# Patient Record
Sex: Male | Born: 1950 | Race: White | Hispanic: No | Smoking: Former smoker
Health system: Southern US, Community
[De-identification: ages and names within clinical notes are randomized; demographics above are authoritative.]

## PROBLEM LIST (undated history)

## (undated) DIAGNOSIS — R972 Elevated prostate specific antigen [PSA]: Secondary | ICD-10-CM

## (undated) DIAGNOSIS — Z86718 Personal history of other venous thrombosis and embolism: Secondary | ICD-10-CM

## (undated) DIAGNOSIS — T7840XA Allergy, unspecified, initial encounter: Secondary | ICD-10-CM

## (undated) DIAGNOSIS — K5790 Diverticulosis of intestine, part unspecified, without perforation or abscess without bleeding: Secondary | ICD-10-CM

## (undated) DIAGNOSIS — M1990 Unspecified osteoarthritis, unspecified site: Secondary | ICD-10-CM

## (undated) DIAGNOSIS — Z86010 Personal history of colonic polyps: Secondary | ICD-10-CM

## (undated) DIAGNOSIS — C801 Malignant (primary) neoplasm, unspecified: Secondary | ICD-10-CM

## (undated) DIAGNOSIS — E291 Testicular hypofunction: Secondary | ICD-10-CM

## (undated) DIAGNOSIS — K219 Gastro-esophageal reflux disease without esophagitis: Secondary | ICD-10-CM

## (undated) DIAGNOSIS — K649 Unspecified hemorrhoids: Secondary | ICD-10-CM

## (undated) DIAGNOSIS — I10 Essential (primary) hypertension: Secondary | ICD-10-CM

## (undated) DIAGNOSIS — Z860101 Personal history of adenomatous and serrated colon polyps: Secondary | ICD-10-CM

## (undated) DIAGNOSIS — E785 Hyperlipidemia, unspecified: Secondary | ICD-10-CM

## (undated) HISTORY — DX: Unspecified hemorrhoids: K64.9

## (undated) HISTORY — DX: Malignant (primary) neoplasm, unspecified: C80.1

## (undated) HISTORY — DX: Testicular hypofunction: E29.1

## (undated) HISTORY — DX: Hyperlipidemia, unspecified: E78.5

## (undated) HISTORY — DX: Personal history of other venous thrombosis and embolism: Z86.718

## (undated) HISTORY — DX: Personal history of colonic polyps: Z86.010

## (undated) HISTORY — PX: HEMORRHOID BANDING: SHX5850

## (undated) HISTORY — DX: Gastro-esophageal reflux disease without esophagitis: K21.9

## (undated) HISTORY — DX: Personal history of adenomatous and serrated colon polyps: Z86.0101

## (undated) HISTORY — PX: COLONOSCOPY W/ BIOPSIES: SHX1374

## (undated) HISTORY — PX: ESOPHAGOGASTRODUODENOSCOPY: SHX1529

## (undated) HISTORY — DX: Unspecified osteoarthritis, unspecified site: M19.90

## (undated) HISTORY — DX: Elevated prostate specific antigen (PSA): R97.20

## (undated) HISTORY — DX: Allergy, unspecified, initial encounter: T78.40XA

## (undated) HISTORY — DX: Essential (primary) hypertension: I10

## (undated) HISTORY — DX: Diverticulosis of intestine, part unspecified, without perforation or abscess without bleeding: K57.90

---

## 1999-03-27 ENCOUNTER — Encounter: Payer: Self-pay | Admitting: Family Medicine

## 1999-03-27 ENCOUNTER — Ambulatory Visit (HOSPITAL_COMMUNITY): Admission: RE | Admit: 1999-03-27 | Discharge: 1999-03-27 | Payer: Self-pay

## 2003-11-14 ENCOUNTER — Encounter: Payer: Self-pay | Admitting: Internal Medicine

## 2003-12-19 ENCOUNTER — Encounter: Payer: Self-pay | Admitting: Internal Medicine

## 2003-12-19 LAB — HM COLONOSCOPY

## 2004-04-06 ENCOUNTER — Ambulatory Visit (HOSPITAL_COMMUNITY): Admission: RE | Admit: 2004-04-06 | Discharge: 2004-04-06 | Payer: Self-pay | Admitting: Family Medicine

## 2008-05-15 ENCOUNTER — Ambulatory Visit: Payer: Self-pay | Admitting: Vascular Surgery

## 2009-12-19 ENCOUNTER — Ambulatory Visit: Payer: Self-pay | Admitting: Vascular Surgery

## 2010-02-05 ENCOUNTER — Ambulatory Visit (HOSPITAL_COMMUNITY): Admission: RE | Admit: 2010-02-05 | Discharge: 2010-02-05 | Payer: Self-pay | Admitting: Vascular Surgery

## 2010-02-05 ENCOUNTER — Ambulatory Visit: Payer: Self-pay | Admitting: Vascular Surgery

## 2010-02-16 ENCOUNTER — Ambulatory Visit: Payer: Self-pay | Admitting: Vascular Surgery

## 2010-02-27 ENCOUNTER — Ambulatory Visit: Payer: Self-pay | Admitting: Vascular Surgery

## 2010-03-06 DEATH — deceased

## 2010-08-17 ENCOUNTER — Ambulatory Visit: Payer: Self-pay | Admitting: Family Medicine

## 2010-08-17 DIAGNOSIS — R7989 Other specified abnormal findings of blood chemistry: Secondary | ICD-10-CM | POA: Insufficient documentation

## 2010-08-17 DIAGNOSIS — I10 Essential (primary) hypertension: Secondary | ICD-10-CM | POA: Insufficient documentation

## 2010-08-17 DIAGNOSIS — K219 Gastro-esophageal reflux disease without esophagitis: Secondary | ICD-10-CM | POA: Insufficient documentation

## 2010-08-17 DIAGNOSIS — E785 Hyperlipidemia, unspecified: Secondary | ICD-10-CM | POA: Insufficient documentation

## 2010-08-17 DIAGNOSIS — J309 Allergic rhinitis, unspecified: Secondary | ICD-10-CM | POA: Insufficient documentation

## 2010-08-18 ENCOUNTER — Encounter: Payer: Self-pay | Admitting: Internal Medicine

## 2010-09-18 ENCOUNTER — Ambulatory Visit: Payer: Self-pay | Admitting: Family Medicine

## 2010-09-18 DIAGNOSIS — R972 Elevated prostate specific antigen [PSA]: Secondary | ICD-10-CM | POA: Insufficient documentation

## 2010-10-01 ENCOUNTER — Ambulatory Visit: Payer: Self-pay | Admitting: Internal Medicine

## 2010-10-26 ENCOUNTER — Telehealth: Payer: Self-pay | Admitting: Family Medicine

## 2010-11-11 ENCOUNTER — Telehealth: Payer: Self-pay | Admitting: Family Medicine

## 2010-12-20 ENCOUNTER — Telehealth: Payer: Self-pay | Admitting: Internal Medicine

## 2010-12-20 ENCOUNTER — Encounter: Payer: Self-pay | Admitting: Internal Medicine

## 2010-12-23 ENCOUNTER — Other Ambulatory Visit: Payer: Self-pay | Admitting: Family Medicine

## 2010-12-23 ENCOUNTER — Ambulatory Visit
Admission: RE | Admit: 2010-12-23 | Discharge: 2010-12-23 | Payer: Self-pay | Source: Home / Self Care | Attending: Family Medicine | Admitting: Family Medicine

## 2010-12-23 LAB — CBC WITH DIFFERENTIAL/PLATELET
Basophils Absolute: 0 10*3/uL (ref 0.0–0.1)
Basophils Relative: 0.3 % (ref 0.0–3.0)
Eosinophils Absolute: 0.2 10*3/uL (ref 0.0–0.7)
Eosinophils Relative: 2.5 % (ref 0.0–5.0)
HCT: 41 % (ref 39.0–52.0)
Hemoglobin: 14 g/dL (ref 13.0–17.0)
Lymphocytes Relative: 13.5 % (ref 12.0–46.0)
Lymphs Abs: 0.9 10*3/uL (ref 0.7–4.0)
MCHC: 34.2 g/dL (ref 30.0–36.0)
MCV: 93.4 fl (ref 78.0–100.0)
Monocytes Absolute: 0.6 10*3/uL (ref 0.1–1.0)
Monocytes Relative: 9 % (ref 3.0–12.0)
Neutro Abs: 5.1 10*3/uL (ref 1.4–7.7)
Neutrophils Relative %: 74.7 % (ref 43.0–77.0)
Platelets: 175 10*3/uL (ref 150.0–400.0)
RBC: 4.39 Mil/uL (ref 4.22–5.81)
RDW: 13 % (ref 11.5–14.6)
WBC: 6.8 10*3/uL (ref 4.5–10.5)

## 2010-12-23 LAB — CONVERTED CEMR LAB
Blood in Urine, dipstick: NEGATIVE
Ketones, urine, test strip: NEGATIVE
Specific Gravity, Urine: 1.025
Urobilinogen, UA: 0.2
WBC Urine, dipstick: NEGATIVE
pH: 5

## 2010-12-23 LAB — BASIC METABOLIC PANEL
BUN: 20 mg/dL (ref 6–23)
CO2: 30 mEq/L (ref 19–32)
Calcium: 8.8 mg/dL (ref 8.4–10.5)
Chloride: 103 mEq/L (ref 96–112)
Creatinine, Ser: 1 mg/dL (ref 0.4–1.5)
GFR: 78.07 mL/min (ref >60.00)
Glucose, Bld: 105 mg/dL — ABNORMAL HIGH (ref 70–99)
Potassium: 4.1 mEq/L (ref 3.5–5.1)
Sodium: 139 mEq/L (ref 135–145)

## 2010-12-23 LAB — LIPID PANEL
Cholesterol: 191 mg/dL (ref 0–200)
HDL: 45 mg/dL (ref >39.00)
LDL Cholesterol: 107 mg/dL — ABNORMAL HIGH (ref 0–99)
Total CHOL/HDL Ratio: 4
Triglycerides: 195 mg/dL — ABNORMAL HIGH (ref 0.0–149.0)
VLDL: 39 mg/dL (ref 0.0–40.0)

## 2010-12-23 LAB — TSH: TSH: 2.56 u[IU]/mL (ref 0.35–5.50)

## 2010-12-23 LAB — HEPATIC FUNCTION PANEL
ALT: 21 U/L (ref 0–53)
AST: 29 U/L (ref 0–37)
Albumin: 4.1 g/dL (ref 3.5–5.2)
Alkaline Phosphatase: 53 U/L (ref 39–117)
Bilirubin, Direct: 0.1 mg/dL (ref 0.0–0.3)
Total Bilirubin: 0.8 mg/dL (ref 0.3–1.2)
Total Protein: 6.7 g/dL (ref 6.0–8.3)

## 2010-12-23 LAB — PSA: PSA: 2.91 ng/mL (ref 0.10–4.00)

## 2010-12-23 LAB — TESTOSTERONE: Testosterone: 140.31 ng/dL — ABNORMAL LOW (ref 350.00–890.00)

## 2010-12-27 ENCOUNTER — Encounter: Payer: Self-pay | Admitting: Family Medicine

## 2010-12-30 ENCOUNTER — Ambulatory Visit
Admission: RE | Admit: 2010-12-30 | Discharge: 2010-12-30 | Payer: Self-pay | Source: Home / Self Care | Attending: Family Medicine | Admitting: Family Medicine

## 2011-01-07 NOTE — Letter (Signed)
Summary: Letter from Patient  Letter from Patient   Imported By: Lennie Odor 12/24/2010 11:30:56  _____________________________________________________________________  External Attachment:    Type:   Image     Comment:   External Document

## 2011-01-07 NOTE — Progress Notes (Signed)
Summary: omperazole change and rev needed in 2 months  Phone Note Outgoing Call   Summary of Call: Letter received indicating he was still having reflux signs on omeprazole 40 mg daily and was better on 40 mg AM and 20 mg late afternoon. Still with some sxs. I have changed rx to omeprazole 40 mg two times a day and want him to see me after 2 months of that Rx Iva Boop MD, [facility]  December 20, 2010 11:24 AM   Follow-up for Phone Call        I have left a message for the patient with the above information.  I have asked him to call me back if he has any questions.  He is also asked to call back next month to schedule an appointment for mid March. Follow-up by: Darcey Nora RN, CGRN,  December 21, 2010 11:27 AM    New/Updated Medications: OMEPRAZOLE 40 MG CPDR (OMEPRAZOLE) 1 by mouth twice a day  30-60 mins before breakfast and supper Prescriptions: OMEPRAZOLE 40 MG CPDR (OMEPRAZOLE) 1 by mouth twice a day  30-60 mins before breakfast and supper  #60 x 3   Entered and Authorized by:   Iva Boop MD, [facility]   Signed by:   Iva Boop MD, FACG on 12/20/2010   Method used:   Electronically to        [facility] [id number]* (retail)       [address]       [place], [place]  [address]       Ph: [phone]       Fax: [phone]   RxID:   [id number]

## 2011-01-07 NOTE — Progress Notes (Signed)
Summary: REQUEST FOR LABS  Phone Note Call from Patient   Caller: Patient Summary of Call: Pt has cpx /  labwork scheduled for January 2012.... would like to have Testosterone Lvl checked as well as PSA test added to cpx labs.... Can you advise order for same?  Initial call taken by: Debbra Riding,  November 11, 2010 9:43 AM  Follow-up for Phone Call        OK to add total testosterone and PSA to labs.  Need to make sure he has labs drawn as early in AM as possible. Follow-up by: Evelena Peat MD,  November 11, 2010 1:12 PM  Additional Follow-up for Phone Call Additional follow up Details #1::        Done....  total testosterone and PSA added to cpx labs.  Additional Follow-up by: Debbra Riding,  November 11, 2010 1:54 PM

## 2011-01-07 NOTE — Assessment & Plan Note (Signed)
Summary: fu per pt/ on bp/meds/njr   Vital Signs:  Patient profile:   60 year old male Weight:      178 pounds Temp:     98.0 degrees F oral BP sitting:   140 / 88  (left arm) BP standing:   110 / 88  (left arm) Cuff size:   regular  Vitals Entered By: Sid Falcon LPN (September 18, 2010 8:49 AM)  Serial Vital Signs/Assessments:  Time      Position  BP       Pulse  Resp  Temp     By           Sitting   130/80                         Evelena Peat MD           Standing  130/80                         Evelena Peat MD   History of Present Illness: Patient seen for multiple items as follows.  History hypertension. Recent orthostatic changes. We reduced lisinopril to one half tablet daily and he felt better but ended up stopping this altogether. Blood pressures have been stable by home readings. No current orthostatic changes. Taking amlodipine.  Needs refills pravastatin for hyperlipidemia. No side effect from medication.  Some persistent GERD symptoms on Nexium. He has GI referral pending.  Frequent leg cramps at night. These may be related to inadequate fluid intake. These are usually fairly brief and mild. No diuretic use.  Reported past history of elevated PSA. Negative biopsies. Needs followup PSA in 6 months and requests getting here.  Allergies (verified): No Known Drug Allergies  Past History:  Past Medical History: Last updated: 08/17/2010 Asthma GERD Hyperlipidemia Hypertension low testosterone Hay fever, allergies  Past Surgical History: Last updated: 08/17/2010 Nasal cartilidge surgery 1975 Verisose veins, 2011  Family History: Last updated: 08/17/2010 Family History of Alcoholism/Addiction, father  Social History: Last updated: 08/17/2010 Occupation:  [employer] Married Alcohol use-yes Regular exercise-yes quit smoking 1989  Risk Factors: Exercise: yes (08/17/2010)  Risk Factors: Smoking Status: quit < 6 months  (08/17/2010) Packs/Day: 0.5 (08/17/2010) PMH-FH-SH reviewed for relevance  Review of Systems  The patient denies anorexia, fever, weight loss, hoarseness, chest pain, syncope, dyspnea on exertion, peripheral edema, prolonged cough, headaches, hemoptysis, abdominal pain, melena, hematochezia, hematuria, and incontinence.    Physical Exam  General:  Well-developed,well-nourished,in no acute distress; alert,appropriate and cooperative throughout examination Mouth:  Oral mucosa and oropharynx without lesions or exudates.  Teeth in good repair. Neck:  No deformities, masses, or tenderness noted. Lungs:  Normal respiratory effort, chest expands symmetrically. Lungs are clear to auscultation, no crackles or wheezes. Heart:  Normal rate and regular rhythm. S1 and S2 normal without gallop, murmur, click, rub or other extra sounds. Extremities:  no edema Neurologic:  alert & oriented X3 and cranial nerves II-XII intact.   Skin:  no rashes.   Psych:  good eye contact, not anxious appearing, and not depressed appearing.     Impression & Recommendations:  Problem # 1:  HYPERTENSION (ICD-401.9) orthostatic changes resolved and blood pressure stable on single medication with amlodipine His updated medication list for this problem includes:    Amlodipine Besylate 5 Mg Tabs (Amlodipine besylate) ..... Once daily    Lisinopril 20 Mg Tabs (Lisinopril) ..... One half tablet daily.  Problem #  2:  GERD (ICD-530.81) continued symptoms on PPI.  GI referral pending. His updated medication list for this problem includes:    Omeprazole 20 Mg Cpdr (Omeprazole) ..... Once daily  Problem # 3:  HYPERLIPIDEMIA (ICD-272.4)  His updated medication list for this problem includes:    Pravastatin Sodium 40 Mg Tabs (Pravastatin sodium) ..... Once daily  Problem # 4:  ELEVATED PROSTATE SPECIFIC ANTIGEN (ICD-790.93) repeat PSA in 6 months.  Problem # 5:  LEG CRAMPS (ICD-729.82) increase fluid intake.  Complete  Medication List: 1)  Omeprazole 20 Mg Cpdr (Omeprazole) .... Once daily 2)  Amlodipine Besylate 5 Mg Tabs (Amlodipine besylate) .... Once daily 3)  Lisinopril 20 Mg Tabs (Lisinopril) .... One half tablet daily. 4)  Androgel Pump 20.25 Mg/act (1.62%) Gel (Testosterone) .... 2 pumps daily  (on hold) 5)  Pravastatin Sodium 40 Mg Tabs (Pravastatin sodium) .... Once daily 6)  Fluticasone Propionate 50 Mcg/act Susp (Fluticasone propionate) .... 2 sprays to each nostril daily as needed  Other Orders: Admin 1st Vaccine (16109) Flu Vaccine 27yrs + (60454)  Patient Instructions: 1)  Schedule PSA in 6 months  790.93 2)  Keep follow up with gastroenterologist 3)  Please schedule a follow-up appointment in 6 months .  4)  Hold lisinopril for now. Prescriptions: AMLODIPINE BESYLATE 5 MG TABS (AMLODIPINE BESYLATE) once daily  #90 x 3   Entered and Authorized by:   Evelena Peat MD   Signed by:   Evelena Peat MD on 09/18/2010   Method used:   Electronically to        [facility] [id number]* (retail)       [address]       [place], [place]  [address]       Ph: [phone]       Fax: [phone]   RxID:   [id number] PRAVASTATIN SODIUM 40 MG TABS (PRAVASTATIN SODIUM) once daily  #90 x 3   Entered and Authorized by:   Evelena Peat MD   Signed by:   Evelena Peat MD on 09/18/2010   Method used:   Electronically to        [facility] [id number]* (retail)       [address]       [place], [place]  [address]       Ph: [phone]       Fax: [phone]   RxID:   [id number]    Flu Vaccine Consent Questions     Do you have a history of severe allergic reactions to this vaccine? no    Any prior history of allergic reactions to egg and/or gelatin? no    Do you have a sensitivity to the preservative Thimersol? no    Do you have a past history of Guillan-Barre Syndrome? no    Do you currently have an acute febrile illness? no    Have you ever had a  severe reaction to latex? no    Vaccine information given and explained to patient? yes    Are you currently pregnant? no    Lot Number:[id number]   Exp Date:06/05/2011   Site Given  Left Deltoid IMbflu

## 2011-01-07 NOTE — Consult Note (Signed)
Summary: [facility]   Imported By: Sherian Rein 10/02/2010 09:09:53  _____________________________________________________________________  External Attachment:    Type:   Image     Comment:   External Document

## 2011-01-07 NOTE — Procedures (Signed)
Summary: EGD: Normal   EGD  Procedure date:  12/19/2003  Findings:      Assessment Normal examination.   Patient Name: Daniel Sullivan, Daniel Sullivan. MRN:  Procedure Procedures: Panendoscopy (EGD) CPT: 43235.  Personnel: Endoscopist: Iva Boop, MD, [facility]  Referred By: Arsenio Loader, MD.  Exam Location: Exam performed in Outpatient Clinic. Outpatient  Patient Consent: Procedure, Alternatives, Risks and Benefits discussed, consent obtained, from patient. Consent was obtained by the RN.  Indications Symptoms: Chest Pain. Abdominal pain, location: epigastric. Reflux symptoms  History  Current Medications: Patient is not currently taking Coumadin.  Pre-Exam Physical: Performed Dec 19, 2003  Cardio-pulmonary exam, HEENT exam, Abdominal exam, Mental status exam WNL.  Exam Exam Info: Maximum depth of insertion Duodenum, intended Duodenum. Patient position: on left side. Gastric retroflexion performed. Images taken. ASA Classification: II. Tolerance: excellent.  Sedation Meds: Patient assessed and found to be appropriate for moderate (conscious) sedation. Sedation was managed by the Endoscopist. Fentanyl 50 mcg. given IV. Versed 5 mg. given IV. Cetacaine Spray 2 sprays given aerosolized.  Monitoring: BP and pulse monitoring done. Oximetry used. Supplemental O2 given  Findings - Normal: Proximal Esophagus to Duodenal 2nd Portion.   Assessment Normal examination.  Events  Unplanned Intervention: No unplanned interventions were required.  Plans Medication(s): Continue current medications.  Disposition: After procedure patient sent to recovery. After recovery patient sent home.  Scheduling: Colonoscopy, to Iva Boop, MD, [facility], next   Comments: See colonoscopy report. CC:   Arsenio Loader, MD  This report was created from the original endoscopy report, which was reviewed and signed by the above listed endoscopist.

## 2011-01-07 NOTE — Assessment & Plan Note (Signed)
Summary: GERD/YF   History of Present Illness Visit Type: consult  Primary GI MD: [name] MD [facility] Primary Provider: Evelena Peat, MD  Requesting Provider: Evelena Peat, MD  Chief Complaint: GERD History of Present Illness:   60 yo wm with GERD. He had been ok but then started to notice vague mild chest pains, lower rib cage pressure and an increase in belching. Occasionally but not always post-prandial. Some concern about cancer potential with GERD. These problems began 6-8 weeks ago, not as bad as in the first few weeks. Several times a week now to mild sxs every day. 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 Is actually phasing out Nexium so is on that once daily with omeprazole 20 mg daily. 5+ years of 2-3 AM bowel movements fatty foods like sausage may cause diarrhea.    GI Review of Systems    Reports acid reflux, belching, chest pain, and  heartburn.      Denies abdominal pain, bloating, dysphagia with liquids, dysphagia with solids, loss of appetite, nausea, vomiting, vomiting blood, weight loss, and  weight gain.      Reports diarrhea.     Denies anal fissure, black tarry stools, change in bowel habit, constipation, diverticulosis, fecal incontinence, heme positive stool, hemorrhoids, irritable bowel syndrome, jaundice, light color stool, liver problems, rectal bleeding, and  rectal pain.    Clinical Reports Reviewed:  Colonoscopy:  12/19/2003:  Diverticulosis, Colon.  Hemorrhoids, Internal and  External.   Comments: No colitis seen. Symptoms better after treatment of Blastocystis hominis so did not do biopsies.  EGD:  12/19/2003:  Assessment Normal examination.    Current Medications (verified): 1)  Omeprazole 20 Mg Cpdr (Omeprazole) .... Once Daily 2)  Amlodipine Besylate 5 Mg Tabs (Amlodipine Besylate) .... Once Daily 3)  Androgel Pump 20.25 Mg/act (1.62%) Gel (Testosterone) .... 2 Pumps Daily  (On Hold) 4)   Pravastatin Sodium 40 Mg Tabs (Pravastatin Sodium) .... Once Daily 5)  Fluticasone Propionate 50 Mcg/act Susp (Fluticasone Propionate) .... 2 Sprays To Each Nostril Daily As Needed 6)  Glucosamine-Chondroitin  Caps (Glucosamine-Chondroit-Vit C-Mn) .... One Capsuule By Mouth Once Daily 7)  B Complex  Tabs (B Complex Vitamins) .... One Tablet By Mouth Once Daily  Allergies (verified): No Known Drug Allergies  Past History:  Past Medical History: Asthma GERD Hyperlipidemia Hypertension low testosterone Hay fever, allergies Diverticulosis Hemorrhoids Hearing Trouble-Earring Aid   Past Surgical History: Reviewed history from 10/01/2010 and no changes required. Nasal cartilidge surgery 1975 Varicose veins, 2011  Family History: Reviewed history from 10/01/2010 and no changes required. 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         All other ROS negative except as per HPI.   Vital Signs:  Patient profile:   60 year old male Height:      69 inches Weight:      174 pounds BMI:     25.79 BSA:     1.95 Pulse rate:   76 / minute Pulse rhythm:   regular BP sitting:   132 / 64  (left arm) Cuff size:   regular  Vitals Entered By: Ok Anis CMA (October 01, 2010 1:41 PM)  Physical Exam  General:  Well developed, well nourished, no acute distress. Eyes:  anicteric Neck:  Supple; no masses or thyromegaly. Lungs:  Clear throughout to auscultation. Heart:  Regular rate and rhythm; no murmurs, rubs,  or bruits. Abdomen:  Soft, nontender except minimall in the lower abdomenand nondistended. No masses, hepatosplenomegaly or hernias noted. Normal bowel sounds. Skin:   multiple nevi and sun-damage changes Cervical Nodes:  No significant cervical or supraclavicular adenopathy.  Psych:  Alert and cooperative. Normal mood and affect.   Impression & Recommendations:  Problem # 1:  GERD (ICD-530.81) Assessment Deteriorated Mild vague chest sxs that could be GERD. ? if Amlodipine is causig increased LES relaxation. He is to change that to Lisinopril soon so will see what effect that has. When Nexium is done he should take omeprazole 40 mg daily. Follow-up as needed. Do not think EGD indicated at this time, normal in 2005 some anxiety over job loss and new business may be contributing to sxs  Problem # 2:  ? of IRRITABLE BOWEL SYNDROME (ICD-564.1) Assessment: New I think his bowel pattern reflects this and may be due to prior parasitic infections and diarhea 9post-infectious)  Patient Instructions: 1)  Please continue current medications.  2)  When Nexium is finished use omeprazole 40 mg daily. 3)  Reduce caffeine. 4)  Please schedule a follow-up appointment as needed - for persistent or worsening symptoms. 5)  Copy sent to : Evelena Peat, MD 6)  The medication list was reviewed and reconciled.  All changed / newly prescribed medications were explained.  A complete medication list was provided to the patient / caregiver. Prescriptions: OMEPRAZOLE 40 MG CPDR (OMEPRAZOLE) 1 by mouth once daily 30-60 mins before meal  #30 x 11   Entered and Authorized by:   Iva Boop MD, [facility]   Signed by:   Iva Boop MD, [facility] on 10/01/2010   Method used:   Print then Give to Patient   RxID:   [id number]

## 2011-01-07 NOTE — Progress Notes (Signed)
Summary: Starting back on Lisinopril, questions  Phone Note Call from Patient Call back at Work Phone [phone]   Caller: Patient---triage vm Reason for Call: Talk to Nurse Summary of Call: going on Lisinopril tomorrow. Has 20mg  tablets. please advise on directions. 1qd or 1/2 per day? Initial call taken by: Warnell Forester,  October 26, 2010 9:43 AM  Follow-up for Phone Call        [facility] to clarify reason he will be going back on Lisinopril?  This med was D/Ced due to orthostatic issues in October Follow-up by: Sid Falcon LPN,  October 26, 2010 1:08 PM  Additional Follow-up for Phone Call Additional follow up Details #1::        on his last visit with Dr Caryl Never, they discussed going from Amlodipine to Lisinopril, which he had some left over previously from former MD. Dr Caryl Never said that it was ok. he wants to know should he take 10mg , which he prefers,  or 20mg ? call  him at [phone]. Additional Follow-up by: Warnell Forester,  October 26, 2010 4:30 PM    Additional Follow-up for Phone Call Additional follow up Details #2::    OK to start lisinopril 10 mg tablets one daily and office follow up to reassess in about one  month after starting. Follow-up by: Evelena Peat MD,  October 26, 2010 5:21 PM  Additional Follow-up for Phone Call Additional follow up Details #3:: Details for Additional Follow-up Action Taken: Pt has 20mg , instructed to take 1/2 tab daily, follow-up in 1 month.  Pt voiced his understanding Additional Follow-up by: Sid Falcon LPN,  October 27, 2010 10:41 AM  New/Updated Medications: LISINOPRIL 20 MG TABS (LISINOPRIL) 1/2 tab daily (OV in one month to reassess)

## 2011-01-07 NOTE — Assessment & Plan Note (Signed)
Summary: BRAND NEW PT/TO EST/CJR   Vital Signs:  Patient profile:   60 year old male Height:      69 inches Weight:      170 pounds BMI:     25.20 Temp:     98.3 degrees F oral Pulse rate:   72 / minute Pulse rhythm:   regular Resp:     12 per minute BP sitting:   130 / 80  (left arm) BP standing:   108 / 76  Vitals Entered By: Sid Falcon LPN (August 17, 2010 3:17 PM)  Nutrition Counseling: Patient's BMI is greater than 25 and therefore counseled on weight management options.  Serial Vital Signs/Assessments:  Time      Position  BP       Pulse  Resp  Temp     By           Sitting   118/76                         Evelena Peat MD           Standing  108/76                         Evelena Peat MD  CC: New to establish Is Patient Diabetic? No   History of Present Illness: Here to establish care. Patient has history of the following  Hypertension. Takes amlodipine 5 mg daily. Had been on lisinopril but took himself off. Started this back 4 days ago after noting several slightly elevated readings. He has had increased lightheaded symptoms and dizziness since then. No syncope or presyncope. Exercises regularly and no intolerance to exercise. No chest pains.  Hyperlipidemia treated with pravastatin 40 mg daily. Had complete physical this past March with labs done then. No history of CAD.  Long history of reflux. Describes 3-4 weeks of some progressive belching and intermittent heartburn symptoms. Taking omeprazole and even with taking this twice daily his had some breakthroughs symptoms. No appetite or weight changes. No hematemesis. No stool changes.  History of hypogonadism. Reportedly had elevated PSA this past year with negative prostate biopsy in July. Topical testosterone held since then.  History of seasonal allergies and takes fluticasone nasal.  Preventive Screening-Counseling & Management  Alcohol-Tobacco     Smoking Status: quit < 6 months     Packs/Day:  0.5     Year Started: 1969     Year Quit: 1989  Caffeine-Diet-Exercise     Does Patient Exercise: yes  Allergies (verified): No Known Drug Allergies  Past History:  Family History: Last updated: 08/17/2010 Family History of Alcoholism/Addiction, father  Social History: Last updated: 08/17/2010 Occupation:  [employer] Married Alcohol use-yes Regular exercise-yes quit smoking 1989  Risk Factors: Exercise: yes (08/17/2010)  Risk Factors: Smoking Status: quit < 6 months (08/17/2010) Packs/Day: 0.5 (08/17/2010)  Past Medical History: Asthma GERD Hyperlipidemia Hypertension low testosterone Hay fever, allergies  Past Surgical History: Nasal cartilidge surgery 1975 Verisose veins, 2011 PMH-FH-SH reviewed for relevance  Family History: Family History of Alcoholism/Addiction, father  Social History: Occupation:  Musician Married Alcohol use-yes Regular exercise-yes quit smoking 1989 Smoking Status:  quit < 6 months Packs/Day:  0.5 Occupation:  employed Does Patient Exercise:  yes  Review of Systems  The patient denies anorexia, fever, weight loss, weight gain, vision loss, decreased hearing, chest pain, syncope, dyspnea on exertion, peripheral edema, prolonged cough, headaches, hemoptysis, abdominal  pain, melena, hematochezia, severe indigestion/heartburn, hematuria, incontinence, muscle weakness, suspicious skin lesions, depression, enlarged lymph nodes, and testicular masses.    Physical Exam  General:  Well-developed,well-nourished,in no acute distress; alert,appropriate and cooperative throughout examination Head:  Normocephalic and atraumatic without obvious abnormalities. No apparent alopecia or balding. Eyes:  pupils equal, pupils round, and pupils reactive to light.   Ears:  External ear exam shows no significant lesions or deformities.  Otoscopic examination reveals clear canals, tympanic membranes are intact bilaterally without bulging,  retraction, inflammation or discharge. Hearing is grossly normal bilaterally. Mouth:  Oral mucosa and oropharynx without lesions or exudates.  Teeth in good repair. Neck:  No deformities, masses, or tenderness noted. Lungs:  Normal respiratory effort, chest expands symmetrically. Lungs are clear to auscultation, no crackles or wheezes. Heart:  normal rate, regular rhythm, and no murmur.   Abdomen:  Bowel sounds positive,abdomen soft and non-tender without masses, organomegaly or hernias noted. Msk:  No deformity or scoliosis noted of thoracic or lumbar spine.   Extremities:  No clubbing, cyanosis, edema, or deformity noted with normal full range of motion of all joints.   Neurologic:  alert & oriented X3, cranial nerves II-XII intact, strength normal in all extremities, and gait normal.   Skin:  no rashes and no suspicious lesions.   Cervical Nodes:  No lymphadenopathy noted Psych:  normally interactive, good eye contact, not anxious appearing, and not depressed appearing.     Impression & Recommendations:  Problem # 1:  HYPERTENSION (ICD-401.9) reduce lisinopril to one half tablet daily. His updated medication list for this problem includes:    Amlodipine Besylate 5 Mg Tabs (Amlodipine besylate) ..... Once daily    Lisinopril 20 Mg Tabs (Lisinopril) ..... One half tablet daily.  Problem # 2:  GERD (ICD-530.81) persistent symptoms on PPI.  Discussed GI referral. His updated medication list for this problem includes:    Omeprazole 20 Mg Cpdr (Omeprazole) ..... Once daily  Orders: Gastroenterology Referral (GI)  Problem # 3:  HYPERLIPIDEMIA (ICD-272.4)  His updated medication list for this problem includes:    Pravastatin Sodium 40 Mg Tabs (Pravastatin sodium) ..... Once daily  Problem # 4:  TESTICULAR HYPOFUNCTION (ICD-257.2)  Problem # 5:  ALLERGIC RHINITIS (ICD-477.9)  Complete Medication List: 1)  Omeprazole 20 Mg Cpdr (Omeprazole) .... Once daily 2)  Amlodipine Besylate 5  Mg Tabs (Amlodipine besylate) .... Once daily 3)  Lisinopril 20 Mg Tabs (Lisinopril) .... One half tablet daily. 4)  Androgel Pump 20.25 Mg/act (1.62%) Gel (Testosterone) .... 2 pumps daily  (on hold) 5)  Pravastatin Sodium 40 Mg Tabs (Pravastatin sodium) .... Once daily  Patient Instructions: 1)  Reduce lisinopril 20 mg to one half tablet daily. 2)  Drink plenty of fluids. 3)  Please schedule a follow-up appointment in 1 month.   Preventive Care Screening  Last Tetanus Booster:    Date:  12/06/2009    Results:  Historical

## 2011-01-07 NOTE — Procedures (Signed)
Summary: Colonoscopy: Diverticulosis, Hemorrhoids   Colonoscopy  Procedure date:  12/19/2003  Findings:      Diverticulosis, Colon.  Hemorrhoids, Internal and  External.   Comments: No colitis seen. Symptoms better after treatment of Blastocystis hominis so did not do biopsies.  Patient Name: Daniel Sullivan, Daniel Sullivan. MRN:  Procedure Procedures: Colonoscopy CPT: [phone].  Personnel: Endoscopist: Iva Boop, MD, [facility].  Referred By: Arsenio Loader, MD.  Exam Location: Exam performed in Outpatient Clinic. Outpatient  Patient Consent: Procedure, Alternatives, Risks and Benefits discussed, consent obtained, from patient. Consent was obtained by the RN.  Indications Symptoms: Diarrhea Abdominal pain / bloating. Change in bowel habits.  Average Risk Screening Routine.  History  Current Medications: Patient is not currently taking Coumadin.  Pre-Exam Physical: Performed Dec 19, 2003. Cardio-pulmonary exam, Rectal exam, HEENT exam , Abdominal exam, Mental status exam WNL.  Exam Exam: Extent of exam reached: Cecum, The cecum was identified by appendiceal orifice and IC valve. Patient position: on left side. Colon retroflexion performed. Images taken. ASA Classification: II. Tolerance: excellent.  Monitoring: Pulse and BP monitoring, Oximetry used. Supplemental O2 given.  Colon Prep Used MiraLax for colon prep. Dose Used: 64 oz. Prep results: good.  Sedation Meds: Patient assessed and found to be appropriate for moderate (conscious) sedation. Sedation was managed by the Endoscopist. Fentanyl 25 mcg. given IV. Versed 2 mg. given IV.  Findings - NORMAL EXAM: Cecum to Descending Colon.  DIVERTICULOSIS: Sigmoid Colon. Not bleeding. ICD9: Diverticulosis, Colon: 562.10. Comments: mild-moderate.  HEMORRHOIDS: Internal and External. Size: Grade I. Not bleeding. Not thrombosed. ICD9: Hemorrhoids, Internal and  External: 455.6.   Assessment Abnormal examination, see findings above.    Diagnoses: 562.10: Diverticulosis, Colon.  455.6: Hemorrhoids, Internal and  External.   Comments: No colitis seen. Symptoms better after treatment of Blastocystis hominis so did not do biopsies. Events  Unplanned Interventions: No intervention was required.  Plans Medication Plan: Antispasmodics: prn,   Patient Education: Patient given standard instructions for: Diverticulosis. Hemorrhoids.  Comments: I have provided an Rx for metronidazole 750 mg 3x/d for 1 week if recurrent symptoms. (no refill). Disposition: After procedure patient sent to recovery. After recovery patient sent home.  Scheduling/Referral: Primary Care Provider, to Arsenio Loader, MD, as planned and to coordinate future colon cancer screening.,  Clinic Visit, to Iva Boop, MD, [facility], as needed,   CC:   Arsenio Loader, MD  This report was created from the original endoscopy report, which was reviewed and signed by the above listed endoscopist.

## 2011-01-07 NOTE — Assessment & Plan Note (Signed)
Summary: CPX // RS---- PT [facility] // RS   Vital Signs:  Patient profile:   60 year old male Weight:      178 pounds Temp:     97.7 degrees F oral BP sitting:   150 / 96  Vitals Entered By: Sid Falcon LPN (December 30, 2010 8:44 AM)  History of Present Illness: Here for CPE.  PMH, SH, and FH reviewed. Prior colonoscopy.    Hx of low testosterone and briefly on replacement last year until elev PSA> Saw urologist and neg bx.  He would like to consider going back on Androderm at this time.  Freq fatigue and low libido. Occ ED.  Clinical Review Panels:  Prevention   Last Colonoscopy:  Diverticulosis, Colon.  Hemorrhoids, Internal and  External.   Comments: No colitis seen. Symptoms better after treatment of Blastocystis hominis so did not do biopsies. (12/19/2003)   Last PSA:  2.91 (12/23/2010)  Immunizations   Last Tetanus Booster:  Historical (12/06/2009)   Last Flu Vaccine:  Fluvax 3+ (09/18/2010)   Allergies (verified): No Known Drug Allergies  Past History:  Past Medical History: Last updated: 10/01/2010 Asthma GERD Hyperlipidemia Hypertension low testosterone Hay fever, allergies Diverticulosis Hemorrhoids Hearing Trouble-Earring Aid   Past Surgical History: Last updated: 10/01/2010 Nasal cartilidge surgery 1975 Varicose veins, 2011  Family History: Last updated: 10/01/2010 Family History of Alcoholism/Addiction, father No FH of Colon Cancer:  Social History: Last updated: 10/01/2010 Occupation:  Musician (self-owned) Married one child Alcohol use-yes: 2 daily Regular exercise-yes quit smoking 1989 Daily Caffeine Use: 2 daily  Illicit Drug Use - no  Risk Factors: Exercise: yes (08/17/2010)  Risk Factors: Smoking Status: quit < 6 months (08/17/2010) Packs/Day: 0.5 (08/17/2010) PMH-FH-SH reviewed for relevance  Review of Systems  The patient denies anorexia, fever, weight loss, weight gain, vision loss, decreased hearing,  hoarseness, chest pain, syncope, dyspnea on exertion, peripheral edema, prolonged cough, headaches, hemoptysis, abdominal pain, melena, hematochezia, severe indigestion/heartburn, hematuria, incontinence, genital sores, muscle weakness, suspicious skin lesions, transient blindness, difficulty walking, depression, unusual weight change, abnormal bleeding, enlarged lymph nodes, and testicular masses.    Physical Exam  General:  Well-developed,well-nourished,in no acute distress; alert,appropriate and cooperative throughout examination Head:  Normocephalic and atraumatic without obvious abnormalities. No apparent alopecia or balding. Eyes:  pupils equal, pupils round, and pupils reactive to light.   Ears:  External ear exam shows no significant lesions or deformities.  Otoscopic examination reveals clear canals, tympanic membranes are intact bilaterally without bulging, retraction, inflammation or discharge. Hearing is grossly normal bilaterally. Mouth:  Oral mucosa and oropharynx without lesions or exudates.  Teeth in good repair. Neck:  No deformities, masses, or tenderness noted. Lungs:  Normal respiratory effort, chest expands symmetrically. Lungs are clear to auscultation, no crackles or wheezes. Heart:  normal rate and regular rhythm.   Abdomen:  soft, non-tender, normal bowel sounds, no distention, no masses, no guarding, no rigidity, no hepatomegaly, and no splenomegaly.   Prostate:  per urology recently so not repeated. Msk:  No deformity or scoliosis noted of thoracic or lumbar spine.   Extremities:  No clubbing, cyanosis, edema, or deformity noted with normal full range of motion of all joints.   Neurologic:  alert & oriented X3, cranial nerves II-XII intact, and strength normal in all extremities.   Skin:  no rashes and no suspicious lesions.  Scaly area R ear but no ulceration or nodule Cervical Nodes:  No lymphadenopathy noted Psych:  Oriented X3, memory intact for  recent and remote,  good eye contact, not anxious appearing, and not depressed appearing.      Impression & Recommendations:  Problem # 1:  ROUTINE GENERAL MEDICAL EXAM@HEALTH  CARE FACL (ICD-V70.0) labs reviewed with pt.  tetanus and flu up to date.  Rec regular exercise.  Problem # 2:  TESTICULAR HYPOFUNCTION (ICD-257.2) start back Androgel and repeat levels in one month.  Problem # 3:  HYPERTENSION (ICD-401.9) Assessment: Deteriorated go back to lisinopril 20 mg and repeat BP one month The following medications were removed from the medication list:    Amlodipine Besylate 5 Mg Tabs (Amlodipine besylate) ..... Once daily His updated medication list for this problem includes:    Lisinopril 20 Mg Tabs (Lisinopril) ..... One by mouth once daily  Complete Medication List: 1)  Omeprazole 40 Mg Cpdr (Omeprazole) .Marland Kitchen.. 1 by mouth twice a day  30-60 mins before breakfast and supper 2)  Androgel Pump 20.25 Mg/act (1.62%) Gel (Testosterone) .... 2 pumps daily  (on hold) 3)  Pravastatin Sodium 40 Mg Tabs (Pravastatin sodium) .... Once daily 4)  Fluticasone Propionate 50 Mcg/act Susp (Fluticasone propionate) .... 2 sprays to each nostril daily as needed 5)  Glucosamine-chondroitin Caps (Glucosamine-chondroit-vit c-mn) .... One capsuule by mouth once daily 6)  B Complex Tabs (B complex vitamins) .... One tablet by mouth once daily 7)  Lisinopril 20 Mg Tabs (Lisinopril) .... One by mouth once daily  Patient Instructions: 1)  Please schedule a follow-up appointment in 1 month.  2)  Go back to lisinopril 20 mg daily. Prescriptions: FLUTICASONE PROPIONATE 50 MCG/ACT SUSP (FLUTICASONE PROPIONATE) 2 sprays to each nostril daily as needed  #1 x 11   Entered and Authorized by:   Evelena Peat MD   Signed by:   Evelena Peat MD on 12/30/2010   Method used:   Electronically to        [facility] # [id number]* (retail)       [address]       [place], [place]  [address]       Ph: [phone]        Fax: [phone]   RxID:   [id number] LISINOPRIL 20 MG TABS (LISINOPRIL) one by mouth once daily  #30 x 11   Entered and Authorized by:   Evelena Peat MD   Signed by:   Evelena Peat MD on 12/30/2010   Method used:   Electronically to        [facility] # [id number]* (retail)       [address]       [place], [place]  [address]       Ph: [phone]       Fax: [phone]   RxID:   [id number]    Orders Added: 1)  Est. Patient 40-64 years [99396] 2)  Est. Patient Level III [66063]

## 2011-01-07 NOTE — Letter (Signed)
Summary: New Patient letter  [facility]  [address] [place], [place] [address]   Phone: [phone]  Fax: [phone]       08/18/2010 MRN: [id number]  Daniel Sullivan [address] [place], [place]  [address]  Dear Daniel Sullivan,  Welcome to the Gastroenterology Division at [facility].    You are scheduled to see Dr.  [name] on Oct 01, 2010 at 1:45pm on the 3rd floor at [facility], [address]  We ask that you try to arrive at our office 15 minutes prior to your appointment time to allow for check-in.  We would like you to complete the enclosed self-administered evaluation form prior to your visit and bring it with you on the day of your appointment.  We will review it with you.  Also, please bring a complete list of all your medications or, if you prefer, bring the medication bottles and we will list them.  Please bring your insurance card so that we may make a copy of it.  If your insurance requires a referral to see a specialist, please bring your referral form from your primary care physician.  Co-payments are due at the time of your visit and may be paid by cash, check or credit card.     Your office visit will consist of a consult with your physician (includes a physical exam), any laboratory testing he/she may order, scheduling of any necessary diagnostic testing (e.g. x-ray, ultrasound, CT-scan), and scheduling of a procedure (e.g. Endoscopy, Colonoscopy) if required.  Please allow enough time on your schedule to allow for any/all of these possibilities.    If you cannot keep your appointment, please call [phone] to cancel or reschedule prior to your appointment date.  This allows [place] the opportunity to schedule an appointment for another patient in need of care.  If you do not cancel or reschedule by 5 p.m. the business day prior to your appointment date, you will be charged a $50.00 late cancellation/no-show fee.    Thank you for  choosing  Gastroenterology for your medical needs.  We appreciate the opportunity to care for you.  Please visit [place] at our website  to learn more about our practice.                     Sincerely,                                                             The Gastroenterology Division

## 2011-01-11 ENCOUNTER — Encounter: Payer: Self-pay | Admitting: Internal Medicine

## 2011-01-11 ENCOUNTER — Telehealth: Payer: Self-pay | Admitting: Internal Medicine

## 2011-01-13 ENCOUNTER — Encounter: Payer: Self-pay | Admitting: Internal Medicine

## 2011-01-21 ENCOUNTER — Telehealth: Payer: Self-pay | Admitting: Internal Medicine

## 2011-01-21 NOTE — Progress Notes (Signed)
Summary: Needs samples & a prior auth doen. on his over over here   Phone Note Call from Patient Call back at Work Phone [phone]   Call For: Dr Leone Payor Reason for Call: Refill Medication Summary of Call: On his way over to discuss Prior Auth for omneprazole. Wonders if he can get some some samples to hold him over until prior Berkley Harvey is done  Initial call taken by: Leanor Kail [facility],  January 11, 2011 3:30 PM  Follow-up for Phone Call        We never received via fax [facility]   [facility] to get [facility] number to patient insurance company [employer].Marland [phone]  [facility] and spoke with Emelia Loron and she said that she would send the prior auth via fax  Patient was in lobby. I explaind this to patient and said that we will not hear back from [facility] for 24 hours once I submit the prior auth form and I gave patient samples of Prilosec to take until I hear back from [facility].. Told patient I will call him back once I hear from [facility] Follow-up by: Ok Anis CMA,  January 11, 2011 3:55 PM

## 2011-01-21 NOTE — Medication Information (Signed)
Summary: Prior [employer] for Omeprazole/[employer]  Prior [employer] for Omeprazole/[employer]   Imported By: Sherian Rein 01/15/2011 12:18:12  _____________________________________________________________________  External Attachment:    Type:   Image     Comment:   External Document

## 2011-01-22 ENCOUNTER — Encounter: Payer: Self-pay | Admitting: Internal Medicine

## 2011-01-26 ENCOUNTER — Encounter: Payer: Self-pay | Admitting: Internal Medicine

## 2011-01-27 ENCOUNTER — Ambulatory Visit: Payer: Self-pay | Admitting: Family Medicine

## 2011-01-27 NOTE — Progress Notes (Signed)
Summary: Medication/PA  Phone Note Call from Patient Call back at Home Phone [phone]   Caller: Patient Call For: Dr. Leone Payor Reason for Call: Talk to Nurse Summary of Call: His prior auth for his Omeprazole was rejected by [facility]....wants to know what the alternative is Initial call taken by: Karna Christmas,  January 21, 2011 2:36 PM  Follow-up for Phone Call        Kelly-We never received the PA response from where you faxed to [facility].  Can you please call them and check status?  Thanks. Milford Cage [facility]  January 21, 2011 4:27 PM   Additional Follow-up for Phone Call Additional follow up Details #1::        [facility] and spoke with Archie Patten and she said that the amout written for RX is over the amount that is allowed and thats why it was denied. Archie Patten is faxing over a letter that states how RX should be written. Archie Patten said that the RX can be approved for once a day but not twice a day and if we need twice a day it has to be medically  necessary. Do you want RX written for one a day or do a new RX for another PPI  Additional Follow-up by: Ok Anis CMA,  January 22, 2011 8:16 AM    Additional Follow-up for Phone Call Additional follow up Details #2::    He needs it two times a day as he failed to control GERD sxs on once daily omeprazole will do letter Follow-up by: Iva Boop MD, Clementeen Graham,  January 22, 2011 11:17 AM

## 2011-01-27 NOTE — Letter (Signed)
Summary: Results Letter  [facility]  [address] [place], [place] [address]   Phone: [phone]  Fax: [phone]        January 22, 2011  To Whom it May Concern:  Re:  [name] [address] [place], [place]  [address]    Merla Riches and Gentlemen:   Mr. Daniel Sullivan is under my care for GERD. His GERD was not controlled by omeprazole 40 mg daily and I have recommended 40 mg two times a day  omeprazole which is controlling his symptoms. I believe that omeprazole 40 mg two times a day s appropriate and medically necessary.  Please take this into consideration regarding his care and prescription request.  Sincerely,  Iva Boop MD, [facility]  This letter has been electronically signed by your physician.  Appended Document: Results Letter Faxed letter to appeal department at [phone]. Will wait on response back

## 2011-01-29 ENCOUNTER — Telehealth: Payer: Self-pay | Admitting: Internal Medicine

## 2011-02-11 NOTE — Progress Notes (Signed)
Summary: fyi ---thanks  Phone Note Call from Patient Call back at [facility] Phone [phone]   Caller: Patient Call For: Dr Leone Payor Summary of Call: Thank you so very much for your effort with [facility] Initial call taken by: Tawni Levy,  January 29, 2011 3:39 PM  Follow-up for Phone Call        I suggest omeprazole 40 AM and OTC omeprazole 20 evening for now unless he did get the approval Iva Boop MD, [facility]  February 01, 2011 9:47 AM  I have updated the patient's wife with Dr Marvell Fuller recommendations.  Follow-up by: Darcey Nora RN, CGRN,  February 01, 2011 10:22 AM

## 2011-02-11 NOTE — Medication Information (Signed)
Summary: Denied Omeprazole / [employer]  Denied Omeprazole / [employer]   Imported By: Lennie Odor 02/02/2011 11:38:33  _____________________________________________________________________  External Attachment:    Type:   Image     Comment:   External Document

## 2011-02-12 ENCOUNTER — Encounter: Payer: Self-pay | Admitting: Internal Medicine

## 2011-02-16 NOTE — Letter (Signed)
Summary: Letter Regarding Omeprazole / Patient  Letter Regarding Omeprazole / Patient   Imported By: Lennie Odor 02/12/2011 11:56:16  _____________________________________________________________________  External Attachment:    Type:   Image     Comment:   External Document

## 2011-02-16 NOTE — Medication Information (Signed)
Summary: Appeal for Omeprazole / [employer]  Appeal for Omeprazole / [employer]   Imported By: Lennie Odor 02/12/2011 14:18:11  _____________________________________________________________________  External Attachment:    Type:   Image     Comment:   External Document

## 2011-02-23 ENCOUNTER — Encounter: Payer: Self-pay | Admitting: Family Medicine

## 2011-02-24 ENCOUNTER — Encounter: Payer: Self-pay | Admitting: Family Medicine

## 2011-02-24 ENCOUNTER — Ambulatory Visit (INDEPENDENT_AMBULATORY_CARE_PROVIDER_SITE_OTHER): Payer: PRIVATE HEALTH INSURANCE | Admitting: Family Medicine

## 2011-02-24 DIAGNOSIS — E291 Testicular hypofunction: Secondary | ICD-10-CM

## 2011-02-24 DIAGNOSIS — E785 Hyperlipidemia, unspecified: Secondary | ICD-10-CM

## 2011-02-24 DIAGNOSIS — L980 Pyogenic granuloma: Secondary | ICD-10-CM

## 2011-02-24 DIAGNOSIS — I10 Essential (primary) hypertension: Secondary | ICD-10-CM

## 2011-02-24 DIAGNOSIS — L929 Granulomatous disorder of the skin and subcutaneous tissue, unspecified: Secondary | ICD-10-CM

## 2011-02-24 NOTE — Patient Instructions (Signed)
Continue salt water soaks for L thumb. Take lisinopril 20 one half tablet daily. Establish regular exercise.

## 2011-02-24 NOTE — Progress Notes (Signed)
  Subjective:    Patient ID: Daniel Sullivan, male    DOB: [birth year], 60 y.o.   MRN: [id number]  HPI  patient seen for several following items  Hypertension. Some confusion with meds. Had orthostasis with combination of amlodipine and lisinopril. He stopped amlodipine sometime ago. Has continued use with lisinopril. Some occasional lightheadedness. No syncope. No chest pains or shortness of breath. No cough.  Left thumb sore for the past couple of weeks. Possible initial injury. Some minimal granulation tissue. Noted slight drainage, now resolving.. Soreness is improving  Bilateral mild leg aches and pains for several months. Symptoms somewhat inconsistent. Patient questions whether related to Pravachol. No claudication symptoms.  Hypogonadism. Currently on AndroGel pump. Recently reinitiated. Needs followup levels. Overall energy slightly improved.   Review of Systems  Constitutional: Negative for fever, chills, appetite change and unexpected weight change.  Respiratory: Negative for cough, shortness of breath and wheezing.   Cardiovascular: Negative for chest pain, palpitations and leg swelling.  Gastrointestinal: Negative for abdominal pain.  Genitourinary: Negative for dysuria.  Neurological: Negative for headaches.       Objective:   Physical Exam  patient is alert and in no distress.  oropharynx is clear Neck supple no adenopathy Chest clear to auscultation Heart regular rhythm and rate with no murmur Extremities no edema. Muscles nontender to palpation in the calves  has very small granulomatous tissue lateral border left thumb. No purulence. No erythema and nontender       Assessment & Plan:   #1 hypertension suboptimally controlled. Start back lisinopril 20 mg one half tablet daily. Reassess in 3 months #2 hypogonadism. Recheck testosterone levels. #3 bilateral leg pain possibly related to statin. Discontinue pravastatin for one month and if symptoms go away completely  he'll be in touch if not he'll continue back on pravastatin at that time.  #4 small granuloma left thumb probably related to recent trauma. No secondary infection. Observe for now and hyfrecate or cauterize if no better in one month

## 2011-02-25 NOTE — Progress Notes (Signed)
Quick Note:  Pt informed on home VM ______ 

## 2011-03-01 LAB — BASIC METABOLIC PANEL
CO2: 29 mEq/L (ref 19–32)
Calcium: 8.8 mg/dL (ref 8.4–10.5)
GFR calc Af Amer: >60 mL/min (ref >60)
GFR calc non Af Amer: >60 mL/min (ref >60)
Glucose, Bld: 122 mg/dL — ABNORMAL HIGH (ref 70–99)
Potassium: 4.2 mEq/L (ref 3.5–5.1)
Sodium: 139 mEq/L (ref 135–145)

## 2011-03-01 LAB — CBC
HCT: 47.8 % (ref 39.0–52.0)
Hemoglobin: 16.4 g/dL (ref 13.0–17.0)
RBC: 5.12 MIL/uL (ref 4.22–5.81)
RDW: 12.9 % (ref 11.5–15.5)

## 2011-03-02 ENCOUNTER — Other Ambulatory Visit: Payer: Self-pay | Admitting: *Deleted

## 2011-03-02 MED ORDER — FLUTICASONE PROPIONATE (INHAL) 50 MCG/BLIST IN AEPB: 1.0000 | INHALATION_SPRAY | Freq: Two times a day (BID) | RESPIRATORY_TRACT | Status: DC

## 2011-03-02 MED ORDER — TESTOSTERONE 20.25 MG/ACT (1.62%) TD GEL: 2.0000 | Freq: Every day | TRANSDERMAL | Status: DC

## 2011-03-02 NOTE — Telephone Encounter (Signed)
Pt called for refills, pt informed on home VM

## 2011-03-03 ENCOUNTER — Encounter: Payer: Self-pay | Admitting: Family Medicine

## 2011-03-04 ENCOUNTER — Ambulatory Visit (INDEPENDENT_AMBULATORY_CARE_PROVIDER_SITE_OTHER): Payer: PRIVATE HEALTH INSURANCE | Admitting: Family Medicine

## 2011-03-04 ENCOUNTER — Telehealth: Payer: Self-pay | Admitting: Family Medicine

## 2011-03-04 ENCOUNTER — Encounter: Payer: Self-pay | Admitting: Family Medicine

## 2011-03-04 VITALS — BP 150/84 | Temp 98.3°F | Wt 183.0 lb

## 2011-03-04 DIAGNOSIS — R5383 Other fatigue: Secondary | ICD-10-CM

## 2011-03-04 DIAGNOSIS — M791 Myalgia, unspecified site: Secondary | ICD-10-CM

## 2011-03-04 DIAGNOSIS — R5381 Other malaise: Secondary | ICD-10-CM

## 2011-03-04 DIAGNOSIS — G4700 Insomnia, unspecified: Secondary | ICD-10-CM

## 2011-03-04 DIAGNOSIS — IMO0001 Reserved for inherently not codable concepts without codable children: Secondary | ICD-10-CM

## 2011-03-04 MED ORDER — ZOLPIDEM TARTRATE 10 MG PO TABS: 10.0000 mg | ORAL_TABLET | Freq: Every evening | ORAL | Status: DC | PRN

## 2011-03-04 NOTE — Progress Notes (Signed)
  Subjective:    Patient ID: Daniel Sullivan, male    DOB: 1951/08/18, 60 y.o.   MRN: [id number]  HPI Patient is seen with multiple complaints. Chronic problems include history of hyperlipidemia, hypertension, GERD and hypogonadism. Recently he went on testosterone replacement and had some initial improvement in fatigue but has still some significant fatigue issues. He has very poor sleep quality mostly with early morning awakening. No depressive symptoms. Usually only sleeps a few hours per night. Has had some general stressors with work over the past couple of years.  Myalgias especially lower legs. No claudication symptoms. Recently held Pravachol but only off for one week now. No history of vitamin D level. No muscle cramps. Diffuse muscle aches mostly calves. Patient has arm symptoms as well. These symptoms seem to be worse at night. Recent thyroid function normal. No history of fibromyalgia.  No regular alcohol use. Former smoker quit 1989.   Review of Systems  Constitutional: Positive for fatigue. Negative for fever, chills, activity change and appetite change.  HENT: Negative for congestion.   Respiratory: Negative for cough and shortness of breath.   Cardiovascular: Negative for chest pain, palpitations and leg swelling.  Gastrointestinal: Negative for nausea, abdominal pain, diarrhea, constipation and blood in stool.  Genitourinary: Negative for dysuria.  Musculoskeletal: Positive for myalgias. Negative for joint swelling and gait problem.  Skin: Negative for rash.  Neurological: Negative for seizures, syncope and headaches.  Hematological: Negative for adenopathy. Does not bruise/bleed easily.  Psychiatric/Behavioral: Negative for dysphoric mood and agitation.       Objective:   Physical Exam  Constitutional: He is oriented to person, place, and time. He appears well-developed and well-nourished.  HENT:  Head: Normocephalic and atraumatic.  Right Ear: External ear normal.    Left Ear: External ear normal.  Eyes: Conjunctivae are normal. Pupils are equal, round, and reactive to light.  Neck: Normal range of motion. No thyromegaly present.  Cardiovascular: Normal rate, regular rhythm and normal heart sounds.  Exam reveals no gallop.   No murmur heard. Pulmonary/Chest: Effort normal and breath sounds normal. No respiratory distress. He has no wheezes. He has no rales.  Abdominal: Soft. Bowel sounds are normal. He exhibits no mass. There is no tenderness. There is no guarding.  Musculoskeletal: He exhibits no edema.  Lymphadenopathy:    He has no cervical adenopathy.  Neurological: He is alert and oriented to person, place, and time. No cranial nerve deficit.  Skin: No rash noted.  Psychiatric: He has a normal mood and affect. His behavior is normal.          Assessment & Plan:  #1 fatigue. Possibly related to poor sleep. Recent testosterone level improved on testosterone replacement. Could consider further titration as recent lab low limit of normal. See how fatigue improves with increased sleep #2 chronic insomnia. Sleep hygiene discussed. Initiate Ambien 10 mg each bedtime for short-term use. #3 myalgias. Check vitamin D level. Recent thyroid function normal. Continue off Pravachol which has only been off of for about one week now

## 2011-03-04 NOTE — Telephone Encounter (Signed)
Please advise Home phone [phone]

## 2011-03-04 NOTE — Telephone Encounter (Signed)
Pt said that light headeness was not remedied during ov and Androgel has a $500 deduc with his healthcare because it is a non-genic. Is there an alternative? Also, pt says that if he starts taking sleeping med, he may not be able to tell if Statins were causing muscle ache.  Pls call pt to discuss.

## 2011-03-05 LAB — VITAMIN D 25 HYDROXY (VIT D DEFICIENCY, FRACTURES): Vit D, 25-Hydroxy: 45 ng/mL (ref 30–89)

## 2011-03-05 NOTE — Telephone Encounter (Signed)
I do not think taking sleeping aid will in any way impact statin related myalgia (ie, if he stops statin and symptoms cease this is very likely statin related). Unfortunately, there are no inexpensive testosterone options.  Injectable is less expensive but then he will have the added cost of office visits for injection. At this point I'm not sure what his light headedness is related to.  He definitely did not have low BP at office visit.  Main focus there should be adequate hydration (which he appears to be doing).  He could monitor his BP standing on occasion to make sure BP not dropping.

## 2011-03-05 NOTE — Progress Notes (Signed)
Quick Note:  ATTEMPTED TO CALL - ANS M[facility] AT [facility] - LMTCB IF QUESTIONS - LAB NORMAL ______

## 2011-03-08 NOTE — Telephone Encounter (Signed)
Pt informed and he voiced his understanding. 

## 2011-03-11 ENCOUNTER — Telehealth: Payer: Self-pay | Admitting: Family Medicine

## 2011-03-11 NOTE — Telephone Encounter (Signed)
Need test results for Vit D. Pls call asap.

## 2011-03-11 NOTE — Telephone Encounter (Signed)
Pt informed again on home VM

## 2011-03-18 ENCOUNTER — Telehealth: Payer: Self-pay | Admitting: *Deleted

## 2011-03-18 NOTE — Telephone Encounter (Signed)
Pt wants BP meds changed do to ongoing dizziness, please.

## 2011-03-18 NOTE — Telephone Encounter (Signed)
Pt needs to schedule f/u to discuss.

## 2011-03-19 ENCOUNTER — Ambulatory Visit (INDEPENDENT_AMBULATORY_CARE_PROVIDER_SITE_OTHER): Payer: PRIVATE HEALTH INSURANCE | Admitting: Family Medicine

## 2011-03-19 ENCOUNTER — Encounter: Payer: Self-pay | Admitting: Family Medicine

## 2011-03-19 VITALS — BP 128/78 | Temp 97.7°F | Wt 180.0 lb

## 2011-03-19 DIAGNOSIS — I10 Essential (primary) hypertension: Secondary | ICD-10-CM

## 2011-03-19 DIAGNOSIS — K219 Gastro-esophageal reflux disease without esophagitis: Secondary | ICD-10-CM

## 2011-03-19 MED ORDER — LOSARTAN POTASSIUM 50 MG PO TABS: 50.0000 mg | ORAL_TABLET | Freq: Every day | ORAL | Status: DC

## 2011-03-19 NOTE — Progress Notes (Signed)
  Subjective:    Patient ID: Daniel Sullivan, male    DOB: 02/21/1951, 60 y.o.   MRN: [id number]  HPI Patient seen with some persistent nonspecific lightheadedness. No orthostatic symptoms. Denies any shortness of breath or chest pain. Still exercising without problem. He is convinced this may be related to his lisinopril. Occasional dry cough but otherwise no side effects. He denies any headaches. No cognitive changes. No seizures. No focal weakness.  Long history of GERD. Has some persistent reflux symptoms even on omeprazole 40 mg twice a day. He has seen gastroenterologist in the past with reported prior endoscopy about 5 years ago.   Review of Systems  Constitutional: Positive for fatigue. Negative for fever and chills.  HENT: Negative for sore throat, trouble swallowing and voice change.   Respiratory: Negative for cough, shortness of breath and wheezing.   Cardiovascular: Negative for chest pain.  Gastrointestinal: Negative for nausea, vomiting, diarrhea, constipation, blood in stool and abdominal distention.  Neurological: Positive for dizziness and light-headedness. Negative for tremors, seizures, syncope and headaches.  Hematological: Negative for adenopathy. Does not bruise/bleed easily.  Psychiatric/Behavioral: Negative for dysphoric mood.       Objective:   Physical Exam  Constitutional: He is oriented to person, place, and time. He appears well-developed and well-nourished. No distress.  HENT:  Mouth/Throat: Oropharynx is clear and moist. No oropharyngeal exudate.  Cardiovascular: Normal rate and regular rhythm.  Exam reveals no gallop.   No murmur heard. Pulmonary/Chest: Effort normal and breath sounds normal. No respiratory distress. He has no wheezes. He has no rales.  Abdominal: Soft. He exhibits no distension. There is no tenderness.  Musculoskeletal: He exhibits no edema.  Lymphadenopathy:    He has no cervical adenopathy.  Neurological: He is alert and oriented to  person, place, and time. No cranial nerve deficit.  Psychiatric: He has a normal mood and affect. His behavior is normal.          Assessment & Plan:  #1 hypertension. Nonspecific lightheadedness but no orthostatic change today. Per his request we have decided to switch to losartan 50 mg once daily he has followup in June to reassess. #2 GERD with persistent symptoms although twice daily PPI. He is encouraged to followup promptly with gastroenterologist for consideration of further evaluation

## 2011-03-21 ENCOUNTER — Encounter: Payer: Self-pay | Admitting: Family Medicine

## 2011-03-21 NOTE — Assessment & Plan Note (Signed)
Persistent symptoms on PPI.  Will refer to GI for further evaluation.

## 2011-04-16 ENCOUNTER — Telehealth: Payer: Self-pay

## 2011-04-16 ENCOUNTER — Telehealth: Payer: Self-pay | Admitting: *Deleted

## 2011-04-16 MED ORDER — ZOLPIDEM TARTRATE 10 MG PO TABS: 10.0000 mg | ORAL_TABLET | Freq: Every evening | ORAL | Status: DC | PRN

## 2011-04-16 NOTE — Telephone Encounter (Signed)
Message copied by Darcey Nora on Fri Apr 16, 2011 12:02 PM ------      Message from: [name]      Created: Thu Apr 15, 2011  5:31 PM      Regarding: follow-up to his letter       Let him know I have received his letter -             Omeprazole 40 mg daily should be fine      Sometimes the medication itself causes gas pains - strange but true      That may be why he was worse on bid instead of qd            If he continues to struggle with problems I think he will need to schedule an office visit as there are limits to communication and successful care via letters and phone calls

## 2011-04-16 NOTE — Telephone Encounter (Signed)
Rx called in 

## 2011-04-16 NOTE — Telephone Encounter (Signed)
Left message for patient to call back  

## 2011-04-16 NOTE — Telephone Encounter (Signed)
Wife is advised of the below.  Patient is out of town.

## 2011-04-16 NOTE — Telephone Encounter (Signed)
OK to refill for 3 months. 

## 2011-04-16 NOTE — Telephone Encounter (Signed)
Pt pharmacy faxed refill request for Ambien 10 mg, may take one tab by mouth at bedtime as needed Last filled 03-04-2011

## 2011-04-20 NOTE — Assessment & Plan Note (Signed)
OFFICE VISIT   GABRIELLE, MESTER  DOB:  Aug 11, 1951                                       08/12/2010  OVFIE#:[id number]   Faythe Ghee had an appointment to see me today for evaluation of lower  extremity discomfort.  He is status post ligation stripping of saphenous  vein at [facility] on February 05, 2010.  He had reported some  burning and stinging discomfort in his leg and ask for follow-up visit.  This was scheduled for today.  The patient stated on presenting that he  had new insurance coverage and had a 50 dollar co-pay required that he  was unwilling to pay.  He was instructed that this was up to the  insurance carrier not [place] and that if he had a 50 dollar co-pay he needed  to pay this prior to being seen.  He refused and left without being  seen.     Larina Earthly, M.D.  Electronically Signed   TFE/MEDQ  D:  08/12/2010  T:  08/13/2010  Job:  [id number]

## 2011-04-20 NOTE — Procedures (Signed)
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   EXAM:  Using color-flow imaging and pulse Doppler spectral analysis, the  left common femoral, superficial femoral, popliteal, posterior tibial,  greater and lesser saphenous veins were evaluated.  There is evidence  suggesting deep venous insufficiency in the left common femoral vein.   The left saphenofemoral junction is not competent with reflux of >500  milliseconds.  The left GSV is not competent with reflux of >500  milliseconds, with calibers as described below.   The left proximal short saphenous vein demonstrates competency.   GSV Diameter (used if found to be incompetent only)                                            Right    Left  Proximal Greater Saphenous Vein           cm       1.1 cm  Proximal-to-mid-thigh                     cm       cm  Mid thigh                                 cm       0.52 cm  Mid-distal thigh                          cm       cm  Distal thigh                              cm       0.69 cm  Knee                                      cm       0.53 cm   IMPRESSION:  1. Left greater saphenous vein reflux with >500 milliseconds is      identified as described above and on the attached work sheet.  2. The left greater saphenous vein is tortuous.  3. The left deep venous system is not competent at the common femoral      vein level with reflux of >500 milliseconds.  4. The left short saphenous vein is competent.          ___________________________________________  Larina Earthly, M.D.   CH/MEDQ  D:  12/19/2009  T:  12/20/2009  Job:  [id number]

## 2011-04-20 NOTE — Assessment & Plan Note (Signed)
OFFICE VISIT   Daniel Sullivan, Daniel Sullivan  DOB:  1951-04-12                                       12/19/2009  ZOXWR#:[id number]   The patient presents today for continued followup of his left leg venous  hypertension.  He has worn compression garments, but is having  progressively severe pain associated with the venous hypertension.  Of  equal concern, he is now developing progressive venous stasis changes in  his medial left ankle.  He has a very superficial, open, excoriated,  ulcerative area.  He continues to have difficulty with prolonged  standing.  He has pain specifically over the varicosities and also in  his calf.  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  He also hikes for exercise and has some decrease in his frequency and  duration due to leg pain.  He also rides a bike for exercise and had  some decrease of length and duration of his rides due to pain as well.  Review of systems and family medical history is otherwise unchanged.   PHYSICAL EXAMINATION:  Well developed, well nourished, white male  appearing stated age of 33.  His blood pressure is 131/84, pulse 79,  respirations 18.  In  general, he is well nourished and in no acute  distress.  HEENT is normal.  Musculoskeletal:  No major deformities or  cyanosis.  He has no focal weakness or paresthesias from a neurological  standpoint.  Skin is noted for a rash over his medial left ankle.  No  other rashes were noted.   He underwent noninvasive duplex study and I reviewed this with him.  This shows marked varicosities throughout his left leg.  He does have  reflux in an enlarged saphenous vein that is greater than 1 cm at the  proximal nature of this.  It is very tortuous.   I discussed options with the patient.  I explained that he is not a  candidate for laser ablation of his saphenous vein due to his extensive  tortuosity and large size.  I have recommended that we proceed with  ligation of his saphenous vein at the saphenofemoral junction and  surgical removal of his saphenous vein, and also multiple tributary stab  phlebectomies.  He understands and we will proceed with this at his  convenience at the [facility].     Larina Earthly, M.D.  Electronically Signed   TFE/MEDQ  D:  12/19/2009  T:  12/22/2009  Job:  [id number]

## 2011-04-20 NOTE — Consult Note (Signed)
NEW PATIENT CONSULTATION   Daniel Sullivan, Daniel Sullivan  DOB:  February 11, 1951                                       05/15/2008  ZOXWR#:[id number]   The patient presents today for evaluation of venous pathology.  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  He had an office procedure 10-  15 years ago in another city on his right leg and has had a good result  from this.  It is unclear as to what this was.  He has had progressive  pain and swelling in his left leg related to venous varicosities.  He  does have burning sensation over his calf with prolonged standing and  also has pain directly over the large varicosities.  He does not have  any history of deep venous thrombosis and no history of venous  ulceration.   PAST MEDICAL HISTORY:  Significant for asthma.  No other major medical  problems.  He does have allergy to seasonal pollen but no medications.   CURRENT MEDICATIONS:  Lipitor.   PHYSICAL EXAM:  General:  A well-developed, well-nourished white male  appearing his stated age of 60.  Vital signs:  Blood pressure is 121/79,  pulse 67, respirations 18.  His radial and dorsalis pedis pulses are 2+  bilaterally.  Neurological:  He is grossly intact neurologically.  His  right leg is notable for no significant varicosities.  On his left leg  he has marked tributary saphenous varicosities throughout his left thigh  and calf.   He underwent a screening venous duplex by me and this reveals reflux in  his great saphenous vein.  He has marked superficial varicosities on the  left thigh and the saphenous vein penetrates the fascia in the mid upper  thigh and becomes small caliber.   I discussed the significance of this with the patient.  He has not worn  compression garments in the past.  I explained that this is all related  to venous hypertension and I suspect that it is related only to his  superficial  system.  We have fitted him today for graduated compression  stockings and instructed him on the use of this.  I will see him again  in 3 months for further evaluation.  He understands the indication for  the garments.  He will also elevate his legs when possible and will take  ibuprofen 600 mg t.i.d. for discomfort as well.  We will perform a  formal duplex with next visit to assess his deep system as well.  I will  see him again in 3 months for continued discussion.   Larina Earthly, M.D.  Electronically Signed   TFE/MEDQ  D:  05/15/2008  T:  05/16/2008  Job:  [id number]   cc:   Molly Maduro A. Nicholos Johns, M.D.

## 2011-04-20 NOTE — Assessment & Plan Note (Signed)
OFFICE VISIT   Daniel Sullivan, Daniel Sullivan  DOB:  Feb 17, 1951                                       02/27/2010  EAVWU#:[id number]   Here today for followup of ligation and stripping of the left great  saphenous vein and phlebectomy of multiple tributary varicosities on  02/05/10.   He looks quite good today.  He does have some mild erythema in the mid  thigh phlebectomy site.  Otherwise, looks quite good.  He does not have  any evidence of significant varicosities downstream, and his groin  incision is completely healed.   He will continue his usual activities and will see [place] again on an as-  needed basis.     Larina Earthly, M.D.  Electronically Signed   TFE/MEDQ  D:  02/27/2010  T:  02/27/2010  Job:  [id number]

## 2011-04-20 NOTE — Assessment & Plan Note (Signed)
OFFICE VISIT   CHAKA, JEFFERYS  DOB:  12-08-1950                                       02/16/2010  [id number]#:[id number]   Patient returns 11 days post ligation stripping of the proximal left  great saphenous vein with multiple stab phlebectomies by Dr. Arbie Cookey on  March 3rd for painful varicosities.  He has been having some discomfort  in the left inguinal area and was concerned about this, wanted to be  seen today, having an appointment scheduled with Dr. Arbie Cookey on March  25th.  He has had no distal edema, chills, fever or drainage from his  incisions.  He has been wearing an elastic compression stocking.   On examination today, his blood pressure is 144/90, heart rate 75.  The  inguinal and mid thigh incisions are healing nicely with very minimal  erythema.  There is no fluctuance or drainage.  No evidence of hematoma  beneath the wounds.  Stab phlebectomy wounds are also healing well.  He  has a 2+ dorsalis pedis pulse in the left foot.   I have reassured him regarding these findings and told him that this  discomfort will gradually resolve with time.  He will keep his  appointment with Dr. Arbie Cookey for March 25th for further follow-up unless  he has any fever or drainage from the incisions in the interim.     Quita Skye Hart Rochester, M.D.  Electronically Signed   JDL/MEDQ  D:  02/16/2010  T:  02/17/2010  Job:  [id number]

## 2011-05-06 ENCOUNTER — Encounter: Payer: Self-pay | Admitting: Family Medicine

## 2011-05-06 NOTE — Progress Notes (Signed)
This encounter was created in error - please disregard.

## 2011-05-11 ENCOUNTER — Telehealth: Payer: Self-pay | Admitting: *Deleted

## 2011-05-11 DIAGNOSIS — R42 Dizziness and giddiness: Secondary | ICD-10-CM

## 2011-05-11 NOTE — Telephone Encounter (Signed)
In response to a letter received, Dr Caryl Never recommended neurology referral.  Pt agreed, will schedule. Will also scan letter to Epic

## 2011-05-26 ENCOUNTER — Ambulatory Visit: Payer: PRIVATE HEALTH INSURANCE | Admitting: Family Medicine

## 2011-09-01 ENCOUNTER — Telehealth: Payer: Self-pay | Admitting: Family Medicine

## 2011-09-01 MED ORDER — OMEPRAZOLE 40 MG PO CPDR: 40.0000 mg | DELAYED_RELEASE_CAPSULE | Freq: Two times a day (BID) | ORAL | Status: DC

## 2011-09-01 NOTE — Telephone Encounter (Signed)
Med filled, please advise

## 2011-09-01 NOTE — Telephone Encounter (Signed)
Pt went to Neurologist, as suggested. The Neurologist recommended pt to get a referral to Cardiologist due to pts light headedness.   Also pt is req to get refill of omeprazole (PRILOSEC) 40 MG capsule to [facility] on [address] [phone]. Pt would like to be notified when this is done.

## 2011-09-02 NOTE — Telephone Encounter (Signed)
Pt informed Rx ordered for 1 year, we do not see the Neurology medical record yet.  VM asking pt to contact their office to fax/send note to Dr Caryl Never

## 2011-09-02 NOTE — Telephone Encounter (Signed)
OK to refill omeprazole for one year.  I do not recall seeing neurology note.  See if we can locate.

## 2011-09-06 ENCOUNTER — Telehealth: Payer: Self-pay | Admitting: Family Medicine

## 2011-09-06 NOTE — Telephone Encounter (Signed)
[employer] denied this patient's omeprazole. They will only cover 40mg  1x daily. If he is to take 40mg  2x daily, the excess will have to come out of the patient's pocket, per [facility]. Please advise.

## 2011-09-06 NOTE — Telephone Encounter (Signed)
Notify patient and he can take this up with insurance.

## 2011-09-30 ENCOUNTER — Telehealth: Payer: Self-pay | Admitting: Family Medicine

## 2011-09-30 MED ORDER — OMEPRAZOLE 40 MG PO CPDR: 40.0000 mg | DELAYED_RELEASE_CAPSULE | Freq: Every day | ORAL | Status: DC

## 2011-09-30 NOTE — Telephone Encounter (Signed)
Please advise 

## 2011-09-30 NOTE — Telephone Encounter (Signed)
Pt called and said that his insurance will not cover the way that the orig script for Omeprazole was written omeprazole (PRILOSEC) 40 MG capsule script needs to be written as 1 per day. Pls resend to [facility] on [address].    Also pt wanted to check on status of getting referral to Cardiologist, as previously discussed a few wks ago. Pt said that Dr Caryl Never was suppose to contact [facility] in order to get needed info in order to get a cardiologist.

## 2011-09-30 NOTE — Telephone Encounter (Signed)
Pt wife informed, she will call the neurology office and ask them to fax records

## 2011-09-30 NOTE — Telephone Encounter (Signed)
OK to send in OMEPRAZOLE 40 mg once daily. I have not seen any notes from neurology.

## 2011-10-12 ENCOUNTER — Other Ambulatory Visit: Payer: Self-pay | Admitting: Family Medicine

## 2011-10-12 MED ORDER — PRAVASTATIN SODIUM 40 MG PO TABS: 40.0000 mg | ORAL_TABLET | Freq: Every day | ORAL | Status: DC

## 2011-10-12 NOTE — Telephone Encounter (Signed)
Pt need refill on pravastatin 40 mg [facility] [address]. Pt is also checking up  On followup call to nancy from [facility] neurologist(Dr Penumall) concerning dizziness.

## 2011-10-12 NOTE — Telephone Encounter (Signed)
Family is wanting to know if Dr Caryl Never has received medical records from Neurologist.  Something was mentioned about an referral to Cardiologist

## 2011-10-12 NOTE — Telephone Encounter (Signed)
Yes. I just received yesterday.  I recommend follow up here and we can discuss then.

## 2011-10-12 NOTE — Telephone Encounter (Signed)
Rx sent 

## 2011-10-13 NOTE — Telephone Encounter (Signed)
Pt informed on home VM 

## 2011-11-16 ENCOUNTER — Ambulatory Visit: Payer: PRIVATE HEALTH INSURANCE | Admitting: Family Medicine

## 2011-11-24 ENCOUNTER — Ambulatory Visit: Payer: PRIVATE HEALTH INSURANCE | Admitting: Family Medicine

## 2011-12-01 ENCOUNTER — Telehealth: Payer: Self-pay | Admitting: Family Medicine

## 2011-12-01 NOTE — Telephone Encounter (Signed)
Pt coming in for ov on 12/16/11 and is req to get psa lvls and check prostate at time of visit.

## 2011-12-02 NOTE — Telephone Encounter (Signed)
I do not see a lab appt scheduled prior to OV.  He is scheduled for a 15 min appt, on appt schedule, reason for visit:  "wants referral". FYI

## 2011-12-09 ENCOUNTER — Ambulatory Visit: Payer: PRIVATE HEALTH INSURANCE | Admitting: Family Medicine

## 2011-12-16 ENCOUNTER — Encounter: Payer: Self-pay | Admitting: Family Medicine

## 2011-12-16 ENCOUNTER — Ambulatory Visit (INDEPENDENT_AMBULATORY_CARE_PROVIDER_SITE_OTHER): Payer: PRIVATE HEALTH INSURANCE | Admitting: Family Medicine

## 2011-12-16 VITALS — BP 130/96 | Temp 97.5°F | Wt 184.0 lb

## 2011-12-16 DIAGNOSIS — R209 Unspecified disturbances of skin sensation: Secondary | ICD-10-CM

## 2011-12-16 DIAGNOSIS — R42 Dizziness and giddiness: Secondary | ICD-10-CM

## 2011-12-16 DIAGNOSIS — I10 Essential (primary) hypertension: Secondary | ICD-10-CM

## 2011-12-16 DIAGNOSIS — R202 Paresthesia of skin: Secondary | ICD-10-CM

## 2011-12-16 DIAGNOSIS — E291 Testicular hypofunction: Secondary | ICD-10-CM

## 2011-12-16 NOTE — Progress Notes (Signed)
  Subjective:    Patient ID: Daniel Sullivan, male    DOB: 1950/12/11, 61 y.o.   MRN: [id number]  HPI  Patient has long history of some vague somatic symptoms. Had a long standing issues with some lightheadedness and dizziness for quite some time. Has seen neurologists with no clear etiology. He has noted recently that sometimes with lifting weights he feels lightheaded but has not had any syncope. No palpitations.  He has not noted this as much with aerobic exercise. Has been quite active sometimes exercising 1 and 1/2 hours at a time. He has never had any associated chest pain. He has history of hyperlipidemia and hypertension. Quit smoking 1989.  He has history of low testosterone which is currently not being treated. Requests scheduling complete physical.  He's had some chronic intermittent lower leg pain and intermittent numbness involving the right foot. No low back pain. No weakness. No incontinence.  Past Medical History  Diagnosis Date  . Asthma   . GERD (gastroesophageal reflux disease)   . Hyperlipidemia   . Hypertension   . Allergy   . Diverticulosis    No past surgical history on file.  reports that he quit smoking about 23 years ago. His smoking use included Cigarettes. He has a 7.5 pack-year smoking history. He does not have any smokeless tobacco history on file. He reports that he drinks alcohol. He reports that he does not use illicit drugs. family history includes Alcohol abuse in his father. No Known Allergies    Review of Systems  Constitutional: Negative for fever, chills, appetite change, fatigue and unexpected weight change.  Eyes: Negative for visual disturbance.  Respiratory: Negative for cough and shortness of breath.   Cardiovascular: Negative for chest pain, palpitations and leg swelling.  Gastrointestinal: Negative for abdominal pain.  Neurological: Positive for dizziness and light-headedness. Negative for seizures, syncope and weakness.  Hematological:  Negative for adenopathy. Does not bruise/bleed easily.       Objective:   Physical Exam  Constitutional: He is oriented to person, place, and time. He appears well-developed and well-nourished.  HENT:  Mouth/Throat: Oropharynx is clear and moist.  Eyes: Pupils are equal, round, and reactive to light.  Neck: Neck supple. No thyromegaly present.       No carotid bruits  Cardiovascular: Normal rate, regular rhythm and normal heart sounds.  Exam reveals no gallop.   No murmur heard. Pulmonary/Chest: Effort normal and breath sounds normal. No respiratory distress. He has no wheezes. He has no rales.  Musculoskeletal: He exhibits no edema.  Neurological: He is alert and oriented to person, place, and time. He has normal reflexes. No cranial nerve deficit. Coordination normal.       Symmetric reflexes lower extremities No focal strength deficits.  Skin: No rash noted.  Psychiatric: He has a normal mood and affect. His behavior is normal.          Assessment & Plan:  #1 vague dizziness. He's had this for quite some time but recently has noted some episodes occasionally with weight lifting. No associated syncope. No chest pain. Patient requests cardiology referral. We'll proceed. Event monitor may be reasonable given his symptoms.  EKG today shows NSR with no acute abnormality. #2 hypertension with marginal control. Better control by home readings. Continue close monitoring  #3 history of low testosterone. Schedule complete physical and add testosterone level to standard labs. #4 intermittent numbness right foot. No associated weakness. Reassurance and observe

## 2012-01-03 ENCOUNTER — Telehealth: Payer: Self-pay | Admitting: Cardiology

## 2012-01-03 NOTE — Telephone Encounter (Signed)
New Msg: pt calling wanting to speak with someone to find out how much it would cost for pt to get a monitor put on. Pt was told that more than likely MD will want pt to get a monitor when pt was referred. Pt was not told what type of monitor or how long pt will need to wear monitor. Pt has appt with Dr. [place] on Wednesday, 01/05/12 as a new consult.   Please return pt call to discuss further.

## 2012-01-04 NOTE — Telephone Encounter (Signed)
Fu call Patient calling back and wanted to know how much a monitor would cost-a possible range of charges- since his insurance would not cover. Please call him back or he said he would need to cancel appt for tomorrow

## 2012-01-05 ENCOUNTER — Institutional Professional Consult (permissible substitution): Payer: PRIVATE HEALTH INSURANCE | Admitting: Cardiology

## 2012-01-13 ENCOUNTER — Other Ambulatory Visit (INDEPENDENT_AMBULATORY_CARE_PROVIDER_SITE_OTHER): Payer: PRIVATE HEALTH INSURANCE

## 2012-01-13 DIAGNOSIS — Z0000 Encounter for general adult medical examination without abnormal findings: Secondary | ICD-10-CM

## 2012-01-13 LAB — POCT URINALYSIS DIPSTICK
Bilirubin, UA: NEGATIVE
Blood, UA: NEGATIVE
Ketones, UA: NEGATIVE
Spec Grav, UA: 1.015
pH, UA: 7

## 2012-01-13 LAB — BASIC METABOLIC PANEL
BUN: 13 mg/dL (ref 6–23)
GFR: 77.8 mL/min (ref >60.00)
Glucose, Bld: 116 mg/dL — ABNORMAL HIGH (ref 70–99)
Potassium: 4 mEq/L (ref 3.5–5.1)

## 2012-01-13 LAB — HEPATIC FUNCTION PANEL
AST: 30 U/L (ref 0–37)
Total Bilirubin: 0.6 mg/dL (ref 0.3–1.2)

## 2012-01-13 LAB — CBC WITH DIFFERENTIAL/PLATELET
Basophils Absolute: 0 10*3/uL (ref 0.0–0.1)
Eosinophils Absolute: 0.1 10*3/uL (ref 0.0–0.7)
HCT: 42.8 % (ref 39.0–52.0)
Lymphs Abs: 0.9 10*3/uL (ref 0.7–4.0)
MCHC: 33.6 g/dL (ref 30.0–36.0)
Monocytes Absolute: 0.5 10*3/uL (ref 0.1–1.0)
Monocytes Relative: 7.9 % (ref 3.0–12.0)
Platelets: 191 10*3/uL (ref 150.0–400.0)
RDW: 13 % (ref 11.5–14.6)

## 2012-01-13 LAB — LIPID PANEL
Cholesterol: 187 mg/dL (ref 0–200)
HDL: 50.4 mg/dL (ref >39.00)
Triglycerides: 122 mg/dL (ref 0.0–149.0)
VLDL: 24.4 mg/dL (ref 0.0–40.0)

## 2012-01-13 LAB — TSH: TSH: 3.02 u[IU]/mL (ref 0.35–5.50)

## 2012-01-18 ENCOUNTER — Other Ambulatory Visit: Payer: Self-pay | Admitting: Family Medicine

## 2012-01-20 ENCOUNTER — Ambulatory Visit (INDEPENDENT_AMBULATORY_CARE_PROVIDER_SITE_OTHER): Payer: PRIVATE HEALTH INSURANCE | Admitting: Family Medicine

## 2012-01-20 ENCOUNTER — Encounter: Payer: Self-pay | Admitting: Family Medicine

## 2012-01-20 VITALS — BP 128/78 | HR 80 | Temp 98.2°F | Resp 12 | Ht 69.0 in | Wt 182.0 lb

## 2012-01-20 DIAGNOSIS — I10 Essential (primary) hypertension: Secondary | ICD-10-CM

## 2012-01-20 DIAGNOSIS — E291 Testicular hypofunction: Secondary | ICD-10-CM

## 2012-01-20 DIAGNOSIS — R42 Dizziness and giddiness: Secondary | ICD-10-CM

## 2012-01-20 DIAGNOSIS — Z0000 Encounter for general adult medical examination without abnormal findings: Secondary | ICD-10-CM

## 2012-01-20 MED ORDER — TESTOSTERONE 20.25 MG/ACT (1.62%) TD GEL: 2.0000 | Freq: Every day | TRANSDERMAL | Status: DC

## 2012-01-20 NOTE — Patient Instructions (Signed)
Check on coverage for shingles vaccine. We will call you with cardiology referral.

## 2012-01-20 NOTE — Progress Notes (Signed)
Subjective:    Patient ID: Daniel Sullivan, male    DOB: 26-Jun-1951, 61 y.o.   MRN: [id number]  HPI  Patient seen for complete physical examination. History of hypertension, mild hyperlipidemia, GERD, and low testosterone. Has had several months if not years of vague dizziness. Had neurologic workup which was unrevealing. He describes occasional episodes of dizziness associated with exercise such as weight lifting. No syncope. No chest pain. We had suggested cardiology referral but he never went. He is willing to reconsider this time.  Medications reviewed. He's been off testosterone replacement for several months.  Does have some fatigue issues but is still exercising 3-4 times per week.  Compliant with other medications. No side effects. No flu vaccine this year but other immunizations up-to-date-other than no history of shingles vaccine. Colonoscopy up to date.  Past Medical History  Diagnosis Date  . Asthma   . GERD (gastroesophageal reflux disease)   . Hyperlipidemia   . Hypertension   . Allergy   . Diverticulosis    No past surgical history on file.  reports that he quit smoking about 23 years ago. His smoking use included Cigarettes. He has a 7.5 pack-year smoking history. He does not have any smokeless tobacco history on file. He reports that he drinks alcohol. He reports that he does not use illicit drugs. family history includes Alcohol abuse in his father. No Known Allergies    Review of Systems  Constitutional: Negative for fever, activity change, appetite change and fatigue.  HENT: Negative for ear pain, congestion and trouble swallowing.   Eyes: Negative for pain and visual disturbance.  Respiratory: Negative for cough, shortness of breath and wheezing.   Cardiovascular: Negative for chest pain and palpitations.  Gastrointestinal: Negative for nausea, vomiting, abdominal pain, diarrhea, constipation, blood in stool, abdominal distention and rectal pain.  Genitourinary:  Negative for dysuria, hematuria and testicular pain.  Musculoskeletal: Negative for joint swelling and arthralgias.  Skin: Negative for rash.  Neurological: Positive for dizziness (episodes about once per week as previously described). Negative for syncope and headaches.  Hematological: Negative for adenopathy.  Psychiatric/Behavioral: Negative for confusion and dysphoric mood.       Objective:   Physical Exam  Constitutional: He is oriented to person, place, and time. He appears well-developed and well-nourished. No distress.  HENT:  Head: Normocephalic and atraumatic.  Right Ear: External ear normal.  Left Ear: External ear normal.  Mouth/Throat: Oropharynx is clear and moist.  Eyes: Conjunctivae and EOM are normal. Pupils are equal, round, and reactive to light.  Neck: Normal range of motion. Neck supple. No thyromegaly present.  Cardiovascular: Normal rate, regular rhythm and normal heart sounds.   No murmur heard. Pulmonary/Chest: No respiratory distress. He has no wheezes. He has no rales.  Abdominal: Soft. Bowel sounds are normal. He exhibits no distension and no mass. There is no tenderness. There is no rebound and no guarding.  Genitourinary: Rectum normal and prostate normal.  Musculoskeletal: He exhibits no edema.  Lymphadenopathy:    He has no cervical adenopathy.  Neurological: He is alert and oriented to person, place, and time. He displays normal reflexes. No cranial nerve deficit.  Skin: No rash noted.  Psychiatric: He has a normal mood and affect.          Assessment & Plan:  #1 health maintenance. Check on coverage for shingles vaccine. Recommend yearly flu vaccine. Will need repeat colonoscopy in a couple years #2 low testosterone. Start back Androderm spray pump 1.62%  2 sprays daily. Consider repeat testosterone level to couple months #3 intermittent dizziness. Recent EKG unremarkable. Occasionally associated with exercise. No syncope. Schedule back with  cardiology

## 2012-02-15 ENCOUNTER — Encounter: Payer: PRIVATE HEALTH INSURANCE | Admitting: Family Medicine

## 2012-02-24 NOTE — Progress Notes (Signed)
Addended by: Kristian Covey on: 02/24/2012 08:04 AM   Modules accepted: Orders

## 2012-03-01 ENCOUNTER — Telehealth: Payer: Self-pay | Admitting: Family Medicine

## 2012-03-01 NOTE — Telephone Encounter (Signed)
Pt said that the first testosterone MC is $200 less expensive per year. Pls let pt know if Dr Caryl Never wants to write a script for this med and pt will pick up when ready.

## 2012-03-10 NOTE — Telephone Encounter (Signed)
Please let pt know I am researching to learn more about this medication (I have not used this previously).  Route back to me and I will have answer by early next week.

## 2012-03-10 NOTE — Telephone Encounter (Signed)
Pt informed.  He states he heard about it from [facility], his ins. coverage.  I suggested he contact [employer] and see if they will send him or Dr Caryl Never some information on this medication.  He said he would do that today.

## 2012-03-16 ENCOUNTER — Ambulatory Visit: Payer: Self-pay

## 2012-03-22 ENCOUNTER — Other Ambulatory Visit: Payer: Self-pay | Admitting: Family Medicine

## 2012-03-22 NOTE — Telephone Encounter (Signed)
I spoke with [facility] Pharmacist Thayer Ohm.  1st testosterone MC is a new compounding kit, 2% cream testosterone.  It comes in #60 pack size.  Thayer Ohm would have to order it to find out exactly what to compound it with.  Thayer Ohm said the sig; would be similar as to the Androgel, except the androgel is 1% and this is stronger at 2%, so probably 1/2 as much?

## 2012-03-22 NOTE — Telephone Encounter (Signed)
Refill Cozaar for one year.  OK to order 1st testosterone cream but I cannot give specifics for dosing until we see how this is compounded, etc.

## 2012-03-24 ENCOUNTER — Ambulatory Visit (INDEPENDENT_AMBULATORY_CARE_PROVIDER_SITE_OTHER): Payer: PRIVATE HEALTH INSURANCE | Admitting: Cardiovascular Disease

## 2012-03-24 ENCOUNTER — Other Ambulatory Visit: Payer: Self-pay | Admitting: Gastroenterology

## 2012-03-24 ENCOUNTER — Encounter: Payer: Self-pay | Admitting: Cardiovascular Disease

## 2012-03-24 VITALS — BP 135/90 | HR 86 | Resp 18 | Ht 69.0 in | Wt 180.0 lb

## 2012-03-24 DIAGNOSIS — I10 Essential (primary) hypertension: Secondary | ICD-10-CM

## 2012-03-24 DIAGNOSIS — R42 Dizziness and giddiness: Secondary | ICD-10-CM

## 2012-03-24 DIAGNOSIS — I951 Orthostatic hypotension: Secondary | ICD-10-CM

## 2012-03-24 NOTE — Progress Notes (Signed)
Daniel Sullivan Date of Birth  Apr 14, 1951 [facility]      Office  [address]    [address]   [address] [place], [place]  [address]    [place], [place]  [address] [phone]  Fax  [phone]  [phone]  Fax [phone]  Problem List: 1. Hypertension 2. Dizziness 3. Low testosterone 4. Hyperlipidemia  History of Present Illness:  Daniel Sullivan is a 61 year old gentleman with a history of hypertension and hypercholesterolemia. He presents today for further evaluation of dizziness. He's been having these episodes of dizziness for the past year or so.  These episodes typically occur when he stands up from a seated or stoopied position.  He has remained active. He works at [employer] for [employer] periodically. He's able to work all day long and not having significant problems. He also cycles for several hours on occasion.  Current Outpatient Prescriptions on File Prior to Visit  Medication Sig Dispense Refill  . fluticasone (FLONASE) 50 MCG/ACT nasal spray TAKE 2 TABLETS BY MOUTH DAILY AS NEEDED  16 g  3  . Glucosamine-Chondroit-Vit C-Mn TABS Take by mouth daily.       Marland Kitchen losartan (COZAAR) 50 MG tablet TAKE ONE TABLET BY MOUTH ONE TIME DAILY  30 tablet  11  . Multiple Vitamins-Minerals (MENS MULTI VITAMIN & MINERAL PO) Take by mouth daily.        Marland Kitchen omeprazole (PRILOSEC) 40 MG capsule Take 1 capsule (40 mg total) by mouth daily.  30 capsule  11  . pravastatin (PRAVACHOL) 40 MG tablet Take 1 tablet (40 mg total) by mouth daily.  30 tablet  11  . Testosterone (ANDROGEL PUMP) 20.25 MG/ACT (1.62%) GEL Place 2 Act onto the skin daily. 2 pumps daily  75 g  5  . zolpidem (AMBIEN) 10 MG tablet Take 1 tablet (10 mg total) by mouth at bedtime as needed for sleep.  30 tablet  0  . zolpidem (AMBIEN) 10 MG tablet Take 1 tablet (10 mg total) by mouth at bedtime as needed.  30 tablet  2    No Known Allergies  Past Medical History  Diagnosis Date  . Asthma   . GERD  (gastroesophageal reflux disease)   . Hyperlipidemia   . Hypertension   . Allergy   . Diverticulosis     History reviewed. No pertinent past surgical history.  History  Smoking status  . Former Smoker -- 0.5 packs/day for 15 years  . Types: Cigarettes  . Quit date: 02/24/1988  Smokeless tobacco  . Not on file    History  Alcohol Use  . Yes    Family History  Problem Relation Age of Onset  . Alcohol abuse Father     Reviw of Systems:  Reviewed in the HPI.  All other systems are negative.  Physical Exam: Blood pressure 140/102, pulse 86, resp. rate 18, height 5\' 9"  (1.753 m), weight 180 lb (81.647 kg). General: Well developed, well nourished, in no acute distress.  Head: Normocephalic, atraumatic, sclera non-icteric, mucus membranes are moist,   Neck: Supple. Carotids are 2 + without bruits. No JVD  Lungs: Clear bilaterally to auscultation.  Heart: regular rate.  normal  S1 S2. No murmurs, gallops or rubs.  Abdomen: Soft, non-tender, non-distended with normal bowel sounds. No hepatomegaly. No rebound/guarding. No masses.  Msk:  Strength and tone are normal  Extremities: No clubbing or cyanosis. No edema.  Distal pedal pulses are 2+ and equal bilaterally.  Neuro: Alert  and oriented X 3. Moves all extremities spontaneously.  Psych:  Responds to questions appropriately with a normal affect.  ECG: December 16, 2011-normal sinus rhythm. Normal EKG.  Assessment / Plan:

## 2012-03-24 NOTE — Patient Instructions (Signed)
Your physician has requested that you have an echocardiogram. Echocardiography is a painless test that uses sound waves to create images of your heart. It provides your doctor with information about the size and shape of your heart and how well your heart's chambers and valves are working. This procedure takes approximately one hour. There are no restrictions for this procedure.  Your physician recommends that you schedule a follow-up appointment in: 3 months   Your physician recommends that you return for lab work in: 3 months // bmet

## 2012-03-24 NOTE — Assessment & Plan Note (Signed)
Daniel Sullivan presents with symptoms of dizziness but primarily sound like orthostatic hypotension. He also has some dizziness when he lifts weights but I think this is also fairly normal physiology. He eats a little bit of extra salt on occasion.  I've asked him to stay well hydrated.   My hope is that he will be able to decrease some of his medications if he cuts back on his salt intake.  We'll get an echocardiogram to ensure that he does not have any significant structural abnormalities because of his hypertension. I'll see him back in several months.

## 2012-03-29 ENCOUNTER — Ambulatory Visit (INDEPENDENT_AMBULATORY_CARE_PROVIDER_SITE_OTHER): Payer: Self-pay

## 2012-03-29 DIAGNOSIS — R197 Diarrhea, unspecified: Secondary | ICD-10-CM

## 2012-04-05 ENCOUNTER — Ambulatory Visit (HOSPITAL_COMMUNITY): Payer: PRIVATE HEALTH INSURANCE

## 2012-04-05 ENCOUNTER — Ambulatory Visit (INDEPENDENT_AMBULATORY_CARE_PROVIDER_SITE_OTHER): Payer: Self-pay

## 2012-04-05 DIAGNOSIS — R197 Diarrhea, unspecified: Secondary | ICD-10-CM

## 2012-04-06 ENCOUNTER — Telehealth: Payer: Self-pay | Admitting: Family Medicine

## 2012-04-06 NOTE — Telephone Encounter (Signed)
Pharmacist AJ is ordering the First Testosterone [facility] compounding is being ordered, should be arriving by tomorrow.  I will call in the afternoon to see how it is instructed to dose, etc, then ask Dr Caryl Never for sig.

## 2012-04-06 NOTE — Telephone Encounter (Signed)
Pt is requesting First Testosterone MC prescription. He will retest with testosterone in a couple months. He spoke with insurance, and they will cover this instead of Androgel. Pleas call pt if necessary. Pt uses [facility] on [address]

## 2012-04-10 ENCOUNTER — Telehealth: Payer: Self-pay | Admitting: Cardiovascular Disease

## 2012-04-10 NOTE — Telephone Encounter (Signed)
MSG LEFT TO CALL BACK 

## 2012-04-10 NOTE — Telephone Encounter (Signed)
New Problem:     The scheduled ECHO will be out of pocket $500 and the patient was wondering if it was really necessary in the light of his previous positive test.  Please call back.

## 2012-04-10 NOTE — Telephone Encounter (Signed)
Follow- up: ° ° °Patient returned your phone call. Please call back. °

## 2012-04-10 NOTE — Telephone Encounter (Signed)
Told pt dr recommended it to insure he has a structurally normal heart/ pt came in to ov for dizziness. Told pt he will have to make the decision to dc order if he chooses, gave pt reasons behind the order and pt verbalized understanding. He will cancel if he decides to.

## 2012-04-11 MED ORDER — TESTOSTERONE PROPIONATE 2 % TD CREA: TOPICAL_CREAM | TRANSDERMAL | Status: DC

## 2012-04-11 NOTE — Telephone Encounter (Signed)
First Testosterone MC compound Rx [facility], attention: Pharmacist Thayer Ohm.  Sig, use as directed for now.  Pt informed he will need to inform Dr Caryl Never when med comes in and we will determine dosing.  He voiced his understanding. FYI only

## 2012-04-21 ENCOUNTER — Ambulatory Visit (INDEPENDENT_AMBULATORY_CARE_PROVIDER_SITE_OTHER): Payer: Self-pay

## 2012-04-21 DIAGNOSIS — R197 Diarrhea, unspecified: Secondary | ICD-10-CM

## 2012-05-02 ENCOUNTER — Other Ambulatory Visit (HOSPITAL_COMMUNITY): Payer: PRIVATE HEALTH INSURANCE

## 2012-05-04 ENCOUNTER — Ambulatory Visit (HOSPITAL_COMMUNITY): Payer: 59 | Attending: Cardiovascular Disease

## 2012-05-04 ENCOUNTER — Other Ambulatory Visit: Payer: Self-pay

## 2012-05-04 DIAGNOSIS — K219 Gastro-esophageal reflux disease without esophagitis: Secondary | ICD-10-CM | POA: Insufficient documentation

## 2012-05-04 DIAGNOSIS — J45909 Unspecified asthma, uncomplicated: Secondary | ICD-10-CM | POA: Insufficient documentation

## 2012-05-04 DIAGNOSIS — R42 Dizziness and giddiness: Secondary | ICD-10-CM | POA: Insufficient documentation

## 2012-05-04 DIAGNOSIS — I10 Essential (primary) hypertension: Secondary | ICD-10-CM

## 2012-05-04 DIAGNOSIS — E785 Hyperlipidemia, unspecified: Secondary | ICD-10-CM | POA: Insufficient documentation

## 2012-05-04 DIAGNOSIS — Z87891 Personal history of nicotine dependence: Secondary | ICD-10-CM | POA: Insufficient documentation

## 2012-05-09 ENCOUNTER — Other Ambulatory Visit (HOSPITAL_COMMUNITY): Payer: PRIVATE HEALTH INSURANCE

## 2012-05-15 ENCOUNTER — Telehealth: Payer: Self-pay | Admitting: Cardiovascular Disease

## 2012-05-15 NOTE — Telephone Encounter (Signed)
Pt aware of ECHO results Also felt like he didn't need the 56month follow-up appt.  Asked if pt was still experiencing dizziness. Answer: Yes Told pt he should keep appt. And that he also had lab work scheduled Pt agrees. Mylo Red RN

## 2012-05-15 NOTE — Telephone Encounter (Signed)
New Problem:    Patient returned Daniel Sullivan's call about his ECHO results.  Please call back.

## 2012-05-16 NOTE — Telephone Encounter (Signed)
noted 

## 2012-06-14 ENCOUNTER — Other Ambulatory Visit: Payer: PRIVATE HEALTH INSURANCE

## 2012-06-14 ENCOUNTER — Ambulatory Visit: Payer: PRIVATE HEALTH INSURANCE | Admitting: Cardiovascular Disease

## 2012-06-22 ENCOUNTER — Ambulatory Visit: Payer: Self-pay

## 2012-06-22 DIAGNOSIS — R197 Diarrhea, unspecified: Secondary | ICD-10-CM

## 2012-06-23 ENCOUNTER — Ambulatory Visit (INDEPENDENT_AMBULATORY_CARE_PROVIDER_SITE_OTHER): Payer: PRIVATE HEALTH INSURANCE | Admitting: Family Medicine

## 2012-06-23 ENCOUNTER — Encounter: Payer: Self-pay | Admitting: Family Medicine

## 2012-06-23 VITALS — BP 140/90 | Temp 97.7°F | Wt 182.0 lb

## 2012-06-23 DIAGNOSIS — N50812 Left testicular pain: Secondary | ICD-10-CM

## 2012-06-23 DIAGNOSIS — N509 Disorder of male genital organs, unspecified: Secondary | ICD-10-CM

## 2012-06-23 DIAGNOSIS — S86819A Strain of other muscle(s) and tendon(s) at lower leg level, unspecified leg, initial encounter: Secondary | ICD-10-CM

## 2012-06-23 DIAGNOSIS — S838X9A Sprain of other specified parts of unspecified knee, initial encounter: Secondary | ICD-10-CM

## 2012-06-23 DIAGNOSIS — S86119A Strain of other muscle(s) and tendon(s) of posterior muscle group at lower leg level, unspecified leg, initial encounter: Secondary | ICD-10-CM

## 2012-06-23 NOTE — Patient Instructions (Addendum)
Try over the counter Motrin for any recurrent testicular pain Follow up for any persistent pain, fever, or any new symptoms.

## 2012-06-23 NOTE — Progress Notes (Signed)
  Subjective:    Patient ID: Daniel Sullivan, male    DOB: June 01, 1951, 61 y.o.   MRN: [id number]  HPI  Patient seen with 2 issues  Right calf strain yesterday. Felt popping sensation calf muscle with walking up a hill. No Achilles pain. No bruising. Pain is actually somewhat better today but still mild. Minimal icing. One dose of Motrin. Pain is relatively mild and essentially no pain at rest.  Left testicle pain. Onset 3 days ago. No injury. No mass or swelling noted. Worse with squatting. No dysuria. No fever or chills. No alleviating factors.  Pain mild severity.  Past Medical History  Diagnosis Date  . Asthma   . GERD (gastroesophageal reflux disease)   . Hyperlipidemia   . Hypertension   . Allergy   . Diverticulosis    No past surgical history on file.  reports that he quit smoking about 24 years ago. His smoking use included Cigarettes. He has a 7.5 pack-year smoking history. He does not have any smokeless tobacco history on file. He reports that he drinks alcohol. He reports that he does not use illicit drugs. family history includes Alcohol abuse in his father. No Known Allergies    Review of Systems  Constitutional: Negative for fever, chills, appetite change and unexpected weight change.  Gastrointestinal: Negative for abdominal pain.  Genitourinary: Positive for testicular pain. Negative for dysuria and decreased urine volume.  Neurological: Negative for dizziness and headaches.  Hematological: Negative for adenopathy.       Objective:   Physical Exam  Constitutional: He appears well-developed and well-nourished.  Cardiovascular: Normal rate and regular rhythm.   Pulmonary/Chest: Effort normal and breath sounds normal. No respiratory distress. He has no wheezes. He has no rales.  Genitourinary:       Left testicle tender to palpation epididymis region but no mass. No hernia. Right testicle is nontender  Musculoskeletal:       Right calf reveals no ecchymosis. No  visible swelling. Minimal tenderness right calf medial gastrocnemius. No Achilles tenderness. Full-strength with plantar flexion dorsiflexion. No leg edema          Assessment & Plan:  #1 Left testicle pain. Question noninfectious epididymitis. Symptoms improved today. Observe for now. Motrin for recurrence and briefs for better support.  #2 mild strain right gastrocnemius medial head. Continue anti-inflammatory as needed and gentle stretches. No evidence for achilles injury.

## 2012-07-11 ENCOUNTER — Ambulatory Visit (INDEPENDENT_AMBULATORY_CARE_PROVIDER_SITE_OTHER): Payer: Self-pay

## 2012-07-11 DIAGNOSIS — R197 Diarrhea, unspecified: Secondary | ICD-10-CM

## 2012-07-18 ENCOUNTER — Other Ambulatory Visit: Payer: Self-pay | Admitting: Family Medicine

## 2012-09-14 ENCOUNTER — Ambulatory Visit (INDEPENDENT_AMBULATORY_CARE_PROVIDER_SITE_OTHER): Payer: Self-pay

## 2012-09-14 DIAGNOSIS — R197 Diarrhea, unspecified: Secondary | ICD-10-CM

## 2012-09-28 ENCOUNTER — Ambulatory Visit (INDEPENDENT_AMBULATORY_CARE_PROVIDER_SITE_OTHER): Payer: Self-pay

## 2012-09-28 DIAGNOSIS — R197 Diarrhea, unspecified: Secondary | ICD-10-CM

## 2012-10-04 ENCOUNTER — Other Ambulatory Visit: Payer: Self-pay | Admitting: Family Medicine

## 2012-11-21 ENCOUNTER — Ambulatory Visit (INDEPENDENT_AMBULATORY_CARE_PROVIDER_SITE_OTHER): Payer: Self-pay

## 2012-11-21 DIAGNOSIS — R197 Diarrhea, unspecified: Secondary | ICD-10-CM

## 2012-11-23 ENCOUNTER — Ambulatory Visit: Payer: Self-pay

## 2013-01-22 ENCOUNTER — Ambulatory Visit (INDEPENDENT_AMBULATORY_CARE_PROVIDER_SITE_OTHER): Payer: BC Managed Care – PPO | Admitting: Family Medicine

## 2013-01-22 ENCOUNTER — Encounter: Payer: Self-pay | Admitting: Family Medicine

## 2013-01-22 VITALS — BP 140/90 | Temp 97.8°F | Resp 12 | Wt 188.0 lb

## 2013-01-22 DIAGNOSIS — J069 Acute upper respiratory infection, unspecified: Secondary | ICD-10-CM

## 2013-01-22 MED ORDER — PREDNISONE 10 MG PO TABS: ORAL_TABLET | ORAL | Status: DC

## 2013-01-22 NOTE — Patient Instructions (Addendum)
Viral Syndrome  You or your child has Viral Syndrome. It is the most common infection causing "colds" and infections in the nose, throat, sinuses, and breathing tubes. Sometimes the infection causes nausea, vomiting, or diarrhea. The germ that causes the infection is a virus. No antibiotic or other medicine will kill it. There are medicines that you can take to make you or your child more comfortable.   HOME CARE INSTRUCTIONS    Rest in bed until you start to feel better.   If you have diarrhea or vomiting, eat small amounts of crackers and toast. Soup is helpful.   Do not give aspirin or medicine that contains aspirin to children.   Only take over-the-counter or prescription medicines for pain, discomfort, or fever as directed by your caregiver.  SEEK IMMEDIATE MEDICAL CARE IF:    You or your child has not improved within one week.   You or your child has pain that is not at least partially relieved by over-the-counter medicine.   Thick, colored mucus or blood is coughed up.   Discharge from the nose becomes thick yellow or green.   Diarrhea or vomiting gets worse.   There is any major change in your or your child's condition.   You or your child develops a skin rash, stiff neck, severe headache, or are unable to hold down food or fluid.   You or your child has an oral temperature above 102 F (38.9 C), not controlled by medicine.   Your baby is older than 3 months with a rectal temperature of 102 F (38.9 C) or higher.   Your baby is 3 months old or younger with a rectal temperature of 100.4 F (38 C) or higher.  Document Released: 11/07/2006 Document Revised: 02/14/2012 Document Reviewed: 11/08/2007  [employer] Patient Information 2013 [employer].

## 2013-01-22 NOTE — Progress Notes (Signed)
  Subjective:    Patient ID: Daniel Sullivan, male    DOB: [birth year], 61 y.o.   MRN: [id number]  HPI Onset one week ago sore throat.   Increased nasal congestion past few days with cough Few chills but temp not taken. No facial pain.  Colored nasal d/c No upper teeth pain. No headaches.  Mild body aches.  No alleviating factors.     Review of Systems  Constitutional: Positive for chills. Negative for fever.  HENT: Positive for congestion and sore throat.   Respiratory: Positive for cough and wheezing. Negative for shortness of breath.   Neurological: Negative for headaches.       Objective:   Physical Exam  Constitutional: He appears well-developed and well-nourished.  HENT:  Right Ear: External ear normal.  Left Ear: External ear normal.  Mouth/Throat: Oropharynx is clear and moist.  Neck: Neck supple.  Cardiovascular: Normal rate and regular rhythm.   Pulmonary/Chest:  Symmetric breath sounds. Faint expiratory wheezes. No rales  Lymphadenopathy:    He has no cervical adenopathy.          Assessment & Plan:  Viral upper respiratory infection. Mild reactive airway component. Prednisone taper over 6 days. No indication for antibiotics this time. Followup for fever or worsening symptoms

## 2013-03-14 ENCOUNTER — Other Ambulatory Visit: Payer: Self-pay | Admitting: Family Medicine

## 2013-03-15 ENCOUNTER — Other Ambulatory Visit (INDEPENDENT_AMBULATORY_CARE_PROVIDER_SITE_OTHER): Payer: BC Managed Care – PPO

## 2013-03-15 DIAGNOSIS — Z0000 Encounter for general adult medical examination without abnormal findings: Secondary | ICD-10-CM

## 2013-03-15 LAB — HEPATIC FUNCTION PANEL
ALT: 32 U/L (ref 0–53)
AST: 34 U/L (ref 0–37)
Total Protein: 6.8 g/dL (ref 6.0–8.3)

## 2013-03-15 LAB — LIPID PANEL
HDL: 52.3 mg/dL (ref >39.00)
Total CHOL/HDL Ratio: 4
Triglycerides: 184 mg/dL — ABNORMAL HIGH (ref 0.0–149.0)
VLDL: 36.8 mg/dL (ref 0.0–40.0)

## 2013-03-15 LAB — CBC WITH DIFFERENTIAL/PLATELET
Basophils Relative: 0.3 % (ref 0.0–3.0)
Eosinophils Relative: 3 % (ref 0.0–5.0)
MCV: 91.9 fl (ref 78.0–100.0)
Monocytes Absolute: 0.7 10*3/uL (ref 0.1–1.0)
Monocytes Relative: 11.8 % (ref 3.0–12.0)
Neutrophils Relative %: 71 % (ref 43.0–77.0)
RBC: 4.45 Mil/uL (ref 4.22–5.81)
WBC: 5.5 10*3/uL (ref 4.5–10.5)

## 2013-03-15 LAB — POCT URINALYSIS DIPSTICK
Bilirubin, UA: NEGATIVE
Nitrite, UA: NEGATIVE
Urobilinogen, UA: 0.2
pH, UA: 6

## 2013-03-15 LAB — BASIC METABOLIC PANEL
Chloride: 102 mEq/L (ref 96–112)
Creatinine, Ser: 0.9 mg/dL (ref 0.4–1.5)
Potassium: 4.3 mEq/L (ref 3.5–5.1)

## 2013-03-15 LAB — LDL CHOLESTEROL, DIRECT: Direct LDL: 117.9 mg/dL

## 2013-03-22 ENCOUNTER — Encounter: Payer: Self-pay | Admitting: Family Medicine

## 2013-03-22 ENCOUNTER — Ambulatory Visit (INDEPENDENT_AMBULATORY_CARE_PROVIDER_SITE_OTHER): Payer: BC Managed Care – PPO | Admitting: Family Medicine

## 2013-03-22 VITALS — BP 144/92 | HR 72 | Temp 98.0°F | Resp 12 | Ht 69.25 in | Wt 185.0 lb

## 2013-03-22 DIAGNOSIS — Z0000 Encounter for general adult medical examination without abnormal findings: Secondary | ICD-10-CM

## 2013-03-22 DIAGNOSIS — R972 Elevated prostate specific antigen [PSA]: Secondary | ICD-10-CM

## 2013-03-22 NOTE — Progress Notes (Signed)
  Subjective:    Patient ID: IBAN UTZ, male    DOB: [birth year], 62 y.o.   MRN: [id number]  HPI Patient seen for complete physical. Last colonoscopy about 9 years ago. No history of shingles vaccine otherwise immunizations up to date Patient's had previous history of elevated PSA and reportedly had prostate biopsies which were negative a few years ago. No recent appetite or weight change. Exercises regularly. Hypertension treated with losartan. Hyperlipidemia treated with pravastatin.  Past Medical History  Diagnosis Date  . Asthma   . GERD (gastroesophageal reflux disease)   . Hyperlipidemia   . Hypertension   . Allergy   . Diverticulosis    No past surgical history on file.  reports that he quit smoking about 25 years ago. His smoking use included Cigarettes. He has a 7.5 pack-year smoking history. He does not have any smokeless tobacco history on file. He reports that  drinks alcohol. He reports that he does not use illicit drugs. family history includes Alcohol abuse in his father and Cancer (age of onset: 14) in his brother. No Known Allergies    Review of Systems  Constitutional: Negative for fever, activity change, appetite change and fatigue.  HENT: Negative for ear pain, congestion and trouble swallowing.   Eyes: Negative for pain and visual disturbance.  Respiratory: Negative for cough, shortness of breath and wheezing.   Cardiovascular: Negative for chest pain and palpitations.  Gastrointestinal: Negative for nausea, vomiting, abdominal pain, diarrhea, constipation, blood in stool, abdominal distention and rectal pain.  Genitourinary: Negative for dysuria, hematuria and testicular pain.  Musculoskeletal: Negative for joint swelling and arthralgias.  Skin: Negative for rash.  Neurological: Negative for dizziness, syncope and headaches.  Hematological: Negative for adenopathy.  Psychiatric/Behavioral: Negative for confusion and dysphoric mood.       Objective:   Physical Exam  Constitutional: He is oriented to person, place, and time. He appears well-developed and well-nourished. No distress.  HENT:  Head: Normocephalic and atraumatic.  Right Ear: External ear normal.  Left Ear: External ear normal.  Mouth/Throat: Oropharynx is clear and moist.  Eyes: Conjunctivae and EOM are normal. Pupils are equal, round, and reactive to light.  Neck: Normal range of motion. Neck supple. No thyromegaly present.  Cardiovascular: Normal rate, regular rhythm and normal heart sounds.   No murmur heard. Pulmonary/Chest: No respiratory distress. He has no wheezes. He has no rales.  Abdominal: Soft. Bowel sounds are normal. He exhibits no distension and no mass. There is no tenderness. There is no rebound and no guarding.  Musculoskeletal: He exhibits no edema.  Lymphadenopathy:    He has no cervical adenopathy.  Neurological: He is alert and oriented to person, place, and time. He displays normal reflexes. No cranial nerve deficit.  Skin: No rash noted.  Patient has some scattered seborrheic keratoses which are benign-appearing  Psychiatric: He has a normal mood and affect.          Assessment & Plan:  Complete physical. Labs reviewed with patient. He has prediabetes which has been stable for several years. Elevating PSA. Set up urology referral. Check on insurance coverage for shingles vaccine.

## 2013-03-22 NOTE — Patient Instructions (Signed)
Check on coverage for shingles vaccine 

## 2013-06-01 ENCOUNTER — Ambulatory Visit (INDEPENDENT_AMBULATORY_CARE_PROVIDER_SITE_OTHER): Payer: BC Managed Care – PPO | Admitting: Family Medicine

## 2013-06-01 ENCOUNTER — Encounter: Payer: Self-pay | Admitting: Family Medicine

## 2013-06-01 VITALS — BP 170/100 | HR 78 | Temp 98.0°F | Resp 20 | Wt 184.0 lb

## 2013-06-01 DIAGNOSIS — M25569 Pain in unspecified knee: Secondary | ICD-10-CM

## 2013-06-01 DIAGNOSIS — E291 Testicular hypofunction: Secondary | ICD-10-CM

## 2013-06-01 DIAGNOSIS — I10 Essential (primary) hypertension: Secondary | ICD-10-CM

## 2013-06-01 DIAGNOSIS — M79671 Pain in right foot: Secondary | ICD-10-CM

## 2013-06-01 DIAGNOSIS — M79609 Pain in unspecified limb: Secondary | ICD-10-CM

## 2013-06-01 DIAGNOSIS — M25562 Pain in left knee: Secondary | ICD-10-CM

## 2013-06-01 DIAGNOSIS — K219 Gastro-esophageal reflux disease without esophagitis: Secondary | ICD-10-CM

## 2013-06-01 MED ORDER — AMLODIPINE BESYLATE 5 MG PO TABS: 5.0000 mg | ORAL_TABLET | Freq: Every day | ORAL | Status: DC

## 2013-06-01 NOTE — Progress Notes (Signed)
  Subjective:    Patient ID: Daniel Sullivan, male    DOB: [birth year], 62 y.o.   MRN: [id number]  HPI Patient seen with multiple issues as follows  History of hypertension currently treated with losartan. Not monitoring blood pressures recently. Denies any headaches. No chest pains. No dizziness. He had a couple of recent readings here there been slightly elevated. He takes losartan consistently.  Left knee pain. Off and on for several months. No injury. No locking or giving way. No effusion. Location is lateral but poorly localized. Not appearing activities. No alleviating factors  Right foot pain for several months.  Location is proximal to second metatarsal head.  No recent change of shoe wear. He has not noted any callus or plantar wart. Worse with ambulation. Sometimes has a burning stinging sensation and occasional numbness right second toe  Patient history of GERD. Takes omeprazole intermittently. Frequent recurrence of symptoms we stopped. No dysphagia. No appetite or weight changes.  Past Medical History  Diagnosis Date  . Asthma   . GERD (gastroesophageal reflux disease)   . Hyperlipidemia   . Hypertension   . Allergy   . Diverticulosis    No past surgical history on file.  reports that he quit smoking about 25 years ago. His smoking use included Cigarettes. He has a 7.5 pack-year smoking history. He does not have any smokeless tobacco history on file. He reports that  drinks alcohol. He reports that he does not use illicit drugs. family history includes Alcohol abuse in his father and Cancer (age of onset: 64) in his brother. No Known Allergies    Review of Systems  Constitutional: Negative for appetite change, fatigue and unexpected weight change.  Eyes: Negative for visual disturbance.  Respiratory: Negative for cough, chest tightness and shortness of breath.   Cardiovascular: Negative for chest pain, palpitations and leg swelling.  Neurological: Negative for dizziness,  syncope, weakness, light-headedness and headaches.       Objective:   Physical Exam  Constitutional: He appears well-developed and well-nourished. No distress.  Neck: Neck supple. No thyromegaly present.  Cardiovascular: Normal rate and regular rhythm.   Pulmonary/Chest: Effort normal and breath sounds normal. No respiratory distress. He has no wheezes. He has no rales.  Musculoskeletal: He exhibits no edema.  Left knee reveals full range of motion. No effusion. No medial or lateral jointline tenderness. Ligament testing is normal  Right foot reveals no edema. No localized tenderness. Full range of motion right ankle. No interdigital tenderness  Lymphadenopathy:    He has no cervical adenopathy.          Assessment & Plan:  #1 hypertension. Poorly controlled. Add amlodipine 5 mg daily. Reassess blood pressure one month #2 left knee pain. Nonfocal exam. Not limiting activities. Observe for now. X-rays if symptoms persist #3 right foot pain. Question neuropathic. Nonfocal exam. Referral to podiatrist #4 GERD. Discussed lifestyle management. Continue omeprazole as needed #5 low testosterone. We have discouraged starting back replacement oh blood pressure is controlled

## 2013-06-12 ENCOUNTER — Ambulatory Visit: Payer: Self-pay | Admitting: Podiatry

## 2013-06-13 ENCOUNTER — Ambulatory Visit: Payer: Self-pay | Admitting: Podiatry

## 2013-06-22 ENCOUNTER — Encounter: Payer: Self-pay | Admitting: Podiatry

## 2013-06-22 ENCOUNTER — Ambulatory Visit (INDEPENDENT_AMBULATORY_CARE_PROVIDER_SITE_OTHER): Payer: BC Managed Care – PPO | Admitting: Podiatry

## 2013-06-22 VITALS — BP 143/91 | HR 92 | Ht 70.0 in | Wt 175.0 lb

## 2013-06-22 DIAGNOSIS — M21969 Unspecified acquired deformity of unspecified lower leg: Secondary | ICD-10-CM

## 2013-06-22 DIAGNOSIS — M7742 Metatarsalgia, left foot: Secondary | ICD-10-CM

## 2013-06-22 DIAGNOSIS — M7750 Other enthesopathy of unspecified foot: Secondary | ICD-10-CM

## 2013-06-22 DIAGNOSIS — M7741 Metatarsalgia, right foot: Secondary | ICD-10-CM | POA: Insufficient documentation

## 2013-06-22 NOTE — Progress Notes (Signed)
Subjective: 62 y.o. year old male patient presents complaining of pain on right foot. Much less pain on the left.  Dull aching pain on ball of foot after been on them for a while (Duration of 8 years), recently tingling toes on 2nd and 3rd R>L (2 years).  On feet sporadic. Job does not require to be on too long. He has comfortable hiking shoes. He wears special jogging shoes for running.  Patient was referred by Dr. Caryl Never.  Objective: Dermatologic: Normal findings. Vascular: Pedal pulses are all palpable. Orthopedic: Contracted lesser digits with enlarged first metatarsal head R>L. There is excess sagittal plane motion on the first ray R>L upon loading of forefoot.  Neurologic: All epicritic and tactile sensations grossly intact. Radiographic examination reveal short first Metatarsal length on right, dorsally elevated first Metatarsal bilateral. No other abnormal findings noted.  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 Lesser metatarsalgia secondary to lateral weight shifting to lesser Metatarsophalangeal joints R>L. Hallux limitus, functional and structural Right.  Treatment: Reviewed clinical findings and available treatment options, such as metatarsal binder, Orthotics, and change in shoe gear. Patient will return for Orthotics.

## 2013-07-10 ENCOUNTER — Ambulatory Visit (INDEPENDENT_AMBULATORY_CARE_PROVIDER_SITE_OTHER): Payer: BC Managed Care – PPO | Admitting: Podiatry

## 2013-07-10 DIAGNOSIS — M7741 Metatarsalgia, right foot: Secondary | ICD-10-CM

## 2013-07-10 DIAGNOSIS — M21969 Unspecified acquired deformity of unspecified lower leg: Secondary | ICD-10-CM

## 2013-07-10 DIAGNOSIS — M7750 Other enthesopathy of unspecified foot: Secondary | ICD-10-CM

## 2013-07-10 NOTE — Progress Notes (Signed)
Subjective:  62 y.o. year old male patient came in to have Orthotics prepared.   Objective: No new changes from the first visit. Dermatologic: Normal findings.  Vascular: Pedal pulses are all palpable.  Orthopedic: Contracted lesser digits with enlarged first metatarsal head R>L.  There is excess sagittal plane motion on the first ray R>L upon loading of forefoot.  Neurologic: All epicritic and tactile sensations grossly intact.  Radiographic examination reveal short first Metatarsal length on right, dorsally elevated first Metatarsal bilateral.  No other abnormal findings noted.   Assessment:  Short first ray right with sagittal plane hypermobility and dorsal displacement.  Lesser metatarsalgia secondary to lateral weight shifting to lesser Metatarsophalangeal joints R>L.  Hallux limitus, functional and structural Right.   Plan: Both feet are casted for Orthotics.

## 2013-07-19 ENCOUNTER — Other Ambulatory Visit: Payer: Self-pay | Admitting: Family Medicine

## 2013-08-23 ENCOUNTER — Ambulatory Visit (INDEPENDENT_AMBULATORY_CARE_PROVIDER_SITE_OTHER): Payer: BC Managed Care – PPO | Admitting: Family Medicine

## 2013-08-23 ENCOUNTER — Encounter: Payer: Self-pay | Admitting: Family Medicine

## 2013-08-23 VITALS — BP 140/92 | HR 92 | Temp 98.5°F | Wt 181.0 lb

## 2013-08-23 DIAGNOSIS — I10 Essential (primary) hypertension: Secondary | ICD-10-CM

## 2013-08-23 DIAGNOSIS — K644 Residual hemorrhoidal skin tags: Secondary | ICD-10-CM

## 2013-08-23 DIAGNOSIS — Z23 Encounter for immunization: Secondary | ICD-10-CM

## 2013-08-23 MED ORDER — HYDROCORTISONE ACETATE 25 MG RE SUPP: 25.0000 mg | Freq: Two times a day (BID) | RECTAL | Status: DC

## 2013-08-23 NOTE — Patient Instructions (Signed)
Stop Amlodipine for next few days and IF blood pressure goes back up over 150/90 then start back the Amlodipine

## 2013-08-23 NOTE — Progress Notes (Signed)
  Subjective:    Patient ID: Daniel Sullivan, male    DOB: May 20, 1951, 62 y.o.   MRN: [id number]  HPI  Patient here for the following issues  Hypertension. Poorly controlled last summer. We added amlodipine. He is tolerating with no side effects but he has not seen any change in his blood pressure since starting this. He states his average blood pressure has been around 130/80 of the past several weeks. No headaches or peripheral edema issues. He is requesting stopping amlodipine at this time because he has not seen any changes in blood pressure and this is been recently well-controlled  Patient has second new issue of possible hemorrhoids. Had recent prostate biopsy and mention was made of hemorrhoids that time. 4-6 week history of external inflammation. Occasional blood with bowel movement. No constipation issues. No appetite or weight changes. Previous colonoscopy 2005. No recent change in bowel habits.  Past Medical History  Diagnosis Date  . Asthma   . GERD (gastroesophageal reflux disease)   . Hyperlipidemia   . Hypertension   . Allergy   . Diverticulosis    No past surgical history on file.  reports that he quit smoking about 25 years ago. His smoking use included Cigarettes. He has a 7.5 pack-year smoking history. He has never used smokeless tobacco. He reports that  drinks alcohol. He reports that he does not use illicit drugs. family history includes Alcohol abuse in his father; Cancer (age of onset: 36) in his brother. No Known Allergies   Review of Systems  Constitutional: Negative for fatigue.  Eyes: Negative for visual disturbance.  Respiratory: Negative for cough, chest tightness and shortness of breath.   Cardiovascular: Negative for chest pain, palpitations and leg swelling.  Gastrointestinal: Positive for blood in stool. Negative for nausea, vomiting, abdominal pain, diarrhea and constipation.  Neurological: Negative for dizziness, syncope, weakness, light-headedness  and headaches.       Objective:   Physical Exam  Constitutional: He appears well-developed and well-nourished.  Cardiovascular: Normal rate and regular rhythm.   Pulmonary/Chest: Effort normal and breath sounds normal. No respiratory distress. He has no wheezes. He has no rales.  Genitourinary:  Inflamed external hemorrhoids but no evidence reactive bleeding and no acute thrombosis  Musculoskeletal: He exhibits no edema.          Assessment & Plan:  #1 hypertension. Stable. Patient requesting trial off amlodipine. We expressed our concerns about rebound elevation. He will stop this for the next few days and his blood pressure elevating over 150/90 promptly start back his amlodipine. Reassess blood pressure one month  #2 external hemorrhoids. We've recommended measures to reduce constipation. Sitz baths. Hydrocortisone 25 mg suppositories  #3 health maintenance. Flu vaccine given

## 2013-08-29 ENCOUNTER — Encounter: Payer: Self-pay | Admitting: Podiatry

## 2013-08-29 ENCOUNTER — Ambulatory Visit (INDEPENDENT_AMBULATORY_CARE_PROVIDER_SITE_OTHER): Payer: BC Managed Care – PPO | Admitting: Podiatry

## 2013-08-29 DIAGNOSIS — M7750 Other enthesopathy of unspecified foot: Secondary | ICD-10-CM

## 2013-08-29 DIAGNOSIS — M7741 Metatarsalgia, right foot: Secondary | ICD-10-CM

## 2013-08-29 NOTE — Progress Notes (Signed)
Still experiencing pain. Hope to see if we can make any more improvement.  Added 1/4" felt to lesser metatarsal shaft area on right. If this helps, will make permanent adjustment.

## 2013-08-29 NOTE — Patient Instructions (Addendum)
Added more pad under 2nd met right. Try a few more days and return for adjustment.

## 2013-09-03 ENCOUNTER — Telehealth: Payer: Self-pay | Admitting: Family Medicine

## 2013-09-03 NOTE — Telephone Encounter (Signed)
Attempted to call back x 2 on 09/03/13-1700. Left message on VM to call office if needed. JK/CAN

## 2013-09-04 ENCOUNTER — Telehealth: Payer: Self-pay | Admitting: Family Medicine

## 2013-09-04 NOTE — Telephone Encounter (Signed)
OK to use 24 consecutive days.

## 2013-09-04 NOTE — Telephone Encounter (Signed)
Patient Information:  Caller Name: Daniel Sullivan  Phone: [phone]  Patient: Daniel Sullivan, Daniel Sullivan  Gender: Male  DOB: [birth year]  Age: 62 Years  PCP: Evelena Peat (Family Practice)  Office Follow Up:  Does the office need to follow up with this patient?: Yes  Instructions For The Office: Patient asks is it ok for him to continue [facility] suppositories for 24 consecutive days or if he should take it for 12 days, then stop for a period of time(and if so, how long) or if it is ok to use the suppository for 24 consecutive days.  Please leave voice mail if no answer.   Symptoms  Reason For Call & Symptoms: Patient calling about hemorrhoids.  Asks if he should continue Rx steroids for 24 days or if he needs to take a break after completing the current Rx.  He relates he has improved since he was seen.  He denies other concerns, asks that this be confirmed with Dr. Caryl Never; may leave voice mail.  Discuss with PCP and Callback by Nurse Today per No Protocol Available - Information Only guideline.  Reviewed Health History In EMR: Yes  Reviewed Medications In EMR: Yes  Reviewed Allergies In EMR: Yes  Reviewed Surgeries / Procedures: Yes  Date of Onset of Symptoms: Unknown  Treatments Tried: Rx medication  Treatments Tried Worked: Yes  Guideline(s) Used:  No Protocol Available - Information Only  Disposition Per Guideline:   Discuss with PCP and Callback by Nurse Today  Reason For Disposition Reached:   Nursing judgment  Advice Given:  Call Back If:  New symptoms develop  You become worse.  Patient Will Follow Care Advice:  YES

## 2013-09-05 NOTE — Telephone Encounter (Signed)
Left message on machine for patient to return our call 

## 2013-09-17 ENCOUNTER — Other Ambulatory Visit: Payer: Self-pay | Admitting: Family Medicine

## 2013-10-08 ENCOUNTER — Ambulatory Visit (INDEPENDENT_AMBULATORY_CARE_PROVIDER_SITE_OTHER): Payer: BC Managed Care – PPO | Admitting: Podiatry

## 2013-10-08 ENCOUNTER — Encounter: Payer: Self-pay | Admitting: Podiatry

## 2013-10-08 DIAGNOSIS — M21969 Unspecified acquired deformity of unspecified lower leg: Secondary | ICD-10-CM

## 2013-10-08 NOTE — Progress Notes (Signed)
Patient came in for final adjustment on orthotics. Will change  Been hurting and numb at 3-4th toes right foot. Assessment: Lesser metatarsalgia doing well with orthotics. Plan: After trying out new pad(1/8") and found to be helpful, then we will send the orthotics to lab to refurbish with full extended length with new permanent pad.

## 2013-10-08 NOTE — Patient Instructions (Signed)
Orthotic adjusted. Replaced 1/4" Met pad to 1/8" pad. If works, will send to lab and make the liner to full length.

## 2013-10-20 ENCOUNTER — Other Ambulatory Visit: Payer: Self-pay | Admitting: Family Medicine

## 2013-10-23 ENCOUNTER — Encounter: Payer: Self-pay | Admitting: Podiatry

## 2013-10-23 ENCOUNTER — Ambulatory Visit (INDEPENDENT_AMBULATORY_CARE_PROVIDER_SITE_OTHER): Payer: BC Managed Care – PPO | Admitting: Podiatry

## 2013-10-23 DIAGNOSIS — M7741 Metatarsalgia, right foot: Secondary | ICD-10-CM

## 2013-10-23 DIAGNOSIS — M7750 Other enthesopathy of unspecified foot: Secondary | ICD-10-CM

## 2013-10-23 NOTE — Patient Instructions (Signed)
Orthotics adjusted with 1/4" Metatarsal pad.

## 2013-10-23 NOTE — Progress Notes (Signed)
Patient came in to have orthotics adjusted. Last visit, 1/4" pad was removed and placed 1/8" metatarsal pad. It has shifted too distally and with not enough support.  Replaced with 1/4" pad to metatarsal neck on right foot orthotics. Patient will try and return for permanent mounting.

## 2013-10-25 ENCOUNTER — Other Ambulatory Visit: Payer: Self-pay

## 2013-10-25 ENCOUNTER — Ambulatory Visit (INDEPENDENT_AMBULATORY_CARE_PROVIDER_SITE_OTHER): Payer: BC Managed Care – PPO | Admitting: Family Medicine

## 2013-10-25 ENCOUNTER — Encounter: Payer: Self-pay | Admitting: Family Medicine

## 2013-10-25 VITALS — BP 150/90 | HR 70 | Temp 98.0°F | Wt 185.0 lb

## 2013-10-25 DIAGNOSIS — E291 Testicular hypofunction: Secondary | ICD-10-CM

## 2013-10-25 DIAGNOSIS — E785 Hyperlipidemia, unspecified: Secondary | ICD-10-CM

## 2013-10-25 DIAGNOSIS — I10 Essential (primary) hypertension: Secondary | ICD-10-CM

## 2013-10-25 MED ORDER — PRAVASTATIN SODIUM 40 MG PO TABS: ORAL_TABLET | ORAL | Status: DC

## 2013-10-25 MED ORDER — LOSARTAN POTASSIUM 50 MG PO TABS: ORAL_TABLET | ORAL | Status: DC

## 2013-10-25 NOTE — Progress Notes (Signed)
  Subjective:    Patient ID: Daniel Sullivan, male    DOB: 01-18-1951, 62 y.o.   MRN: [id number]  HPI Patient seen for followup regarding these issues:  Hypertension currently treated with losartan 50 mg daily. We had prescribed amlodipine but he stopped this on his own as he did not see improvement in his blood pressure. Has been monitoring his blood pressure daily for several weeks with average over this period 139/90. He brings in his blood pressure cuff today to compare with ours  He had elevated PSA last year and underwent prostate biopsy in August with BPH but no evidence for cancer. He has history of low testosterone and is inquiring about possible testosterone replacement. He does not have any current urinary obstructive symptoms.  Recent external hemorrhoids treated with steroids. Minimal discomfort at this time. No bleeding.  Past Medical History  Diagnosis Date  . Asthma   . GERD (gastroesophageal reflux disease)   . Hyperlipidemia   . Hypertension   . Allergy   . Diverticulosis    No past surgical history on file.  reports that he quit smoking about 25 years ago. His smoking use included Cigarettes. He has a 7.5 pack-year smoking history. He has never used smokeless tobacco. He reports that he drinks alcohol. He reports that he does not use illicit drugs. family history includes Alcohol abuse in his father; Cancer (age of onset: 49) in his brother. No Known Allergies    Review of Systems  Constitutional: Negative for fatigue.  Eyes: Negative for visual disturbance.  Respiratory: Negative for cough, chest tightness and shortness of breath.   Cardiovascular: Negative for chest pain, palpitations and leg swelling.  Neurological: Negative for dizziness, syncope, weakness, light-headedness and headaches.       Objective:   Physical Exam  Constitutional: He is oriented to person, place, and time. He appears well-developed and well-nourished.  HENT:  Right Ear: External ear  normal.  Left Ear: External ear normal.  Mouth/Throat: Oropharynx is clear and moist.  Eyes: Pupils are equal, round, and reactive to light.  Neck: Neck supple. No thyromegaly present.  Cardiovascular: Normal rate and regular rhythm.   Pulmonary/Chest: Effort normal and breath sounds normal. No respiratory distress. He has no wheezes. He has no rales.  Musculoskeletal: He exhibits no edema.  Neurological: He is alert and oriented to person, place, and time.          Assessment & Plan:  #1 hypertension. Stable by home readings. Continue close monitoring. Refill medication for one year #2 Hyperlipidemia treated with pravastatin. Refill medication for one year. He'll get follow labs in April #3 history of hypogonadism. We'll defer to urology regarding whether they think is a suitable candidate for testosterone replacement. Recent prostate biopsy negative for cancer

## 2013-10-25 NOTE — Progress Notes (Signed)
Pre visit review using our clinic review tool, if applicable. No additional management support is needed unless otherwise documented below in the visit note. 

## 2013-10-29 ENCOUNTER — Encounter: Payer: Self-pay | Admitting: Podiatry

## 2013-10-29 ENCOUNTER — Ambulatory Visit (INDEPENDENT_AMBULATORY_CARE_PROVIDER_SITE_OTHER): Payer: BC Managed Care – PPO | Admitting: Podiatry

## 2013-10-29 DIAGNOSIS — M7741 Metatarsalgia, right foot: Secondary | ICD-10-CM

## 2013-10-29 DIAGNOSIS — M21961 Unspecified acquired deformity of right lower leg: Secondary | ICD-10-CM

## 2013-10-29 DIAGNOSIS — M21969 Unspecified acquired deformity of unspecified lower leg: Secondary | ICD-10-CM

## 2013-10-29 DIAGNOSIS — M7750 Other enthesopathy of unspecified foot: Secondary | ICD-10-CM

## 2013-10-29 NOTE — Progress Notes (Signed)
Returned for another adjustment on metatarsal pad. Replaced metatarsal pad and extra pads dispensed. Return as needed.

## 2013-10-29 NOTE — Patient Instructions (Signed)
Metatarsal pad adjusted.

## 2013-12-11 ENCOUNTER — Encounter: Payer: Self-pay | Admitting: Podiatry

## 2013-12-11 ENCOUNTER — Ambulatory Visit (INDEPENDENT_AMBULATORY_CARE_PROVIDER_SITE_OTHER): Payer: BC Managed Care – PPO | Admitting: Podiatry

## 2013-12-11 VITALS — BP 172/109 | HR 88

## 2013-12-11 DIAGNOSIS — M21961 Unspecified acquired deformity of right lower leg: Secondary | ICD-10-CM

## 2013-12-11 DIAGNOSIS — M21969 Unspecified acquired deformity of unspecified lower leg: Secondary | ICD-10-CM

## 2013-12-11 NOTE — Patient Instructions (Signed)
Orthotic adjusted.  

## 2013-12-11 NOTE — Progress Notes (Signed)
Right side Orthotics adjusted. Return as needed.

## 2013-12-28 ENCOUNTER — Encounter: Payer: Self-pay | Admitting: Podiatry

## 2013-12-28 ENCOUNTER — Ambulatory Visit (INDEPENDENT_AMBULATORY_CARE_PROVIDER_SITE_OTHER): Payer: BC Managed Care – PPO | Admitting: Podiatry

## 2013-12-28 DIAGNOSIS — B351 Tinea unguium: Secondary | ICD-10-CM | POA: Insufficient documentation

## 2013-12-28 DIAGNOSIS — M79673 Pain in unspecified foot: Secondary | ICD-10-CM | POA: Insufficient documentation

## 2013-12-28 DIAGNOSIS — M21969 Unspecified acquired deformity of unspecified lower leg: Secondary | ICD-10-CM

## 2013-12-28 DIAGNOSIS — M79609 Pain in unspecified limb: Secondary | ICD-10-CM

## 2013-12-28 NOTE — Progress Notes (Signed)
Patient came in with his right foot orthotic to be adjusted permanently. He has found right position and thickness that was most helpful. Orthotic will be sent out to lab for adjustment. Mean time he will be wearing temporary orthotics.

## 2013-12-28 NOTE — Patient Instructions (Signed)
Orthotic sent off for adjustment. Temporary orthotic dispensed.

## 2014-02-08 DIAGNOSIS — M21969 Unspecified acquired deformity of unspecified lower leg: Secondary | ICD-10-CM

## 2014-02-12 ENCOUNTER — Telehealth: Payer: Self-pay | Admitting: Family Medicine

## 2014-02-12 DIAGNOSIS — K649 Unspecified hemorrhoids: Secondary | ICD-10-CM

## 2014-02-12 NOTE — Telephone Encounter (Signed)
Okay to refer? 

## 2014-02-12 NOTE — Telephone Encounter (Signed)
Pt needs a referral to see dr Earlean Shawl for hemorroids. Pt will be out of town from may 20- to June 15-15

## 2014-02-12 NOTE — Telephone Encounter (Signed)
Referral is ordered

## 2014-03-19 ENCOUNTER — Other Ambulatory Visit (INDEPENDENT_AMBULATORY_CARE_PROVIDER_SITE_OTHER): Payer: BC Managed Care – PPO

## 2014-03-19 DIAGNOSIS — Z0000 Encounter for general adult medical examination without abnormal findings: Secondary | ICD-10-CM

## 2014-03-19 LAB — BASIC METABOLIC PANEL
BUN: 14 mg/dL (ref 6–23)
CO2: 31 mEq/L (ref 19–32)
CREATININE: 0.8 mg/dL (ref 0.4–1.5)
Calcium: 9 mg/dL (ref 8.4–10.5)
Chloride: 103 mEq/L (ref 96–112)
GFR: 104.92 mL/min (ref >60.00)
Glucose, Bld: 108 mg/dL — ABNORMAL HIGH (ref 70–99)
POTASSIUM: 4.2 meq/L (ref 3.5–5.1)
Sodium: 141 mEq/L (ref 135–145)

## 2014-03-19 LAB — CBC WITH DIFFERENTIAL/PLATELET
Basophils Absolute: 0 10*3/uL (ref 0.0–0.1)
Basophils Relative: 0.4 % (ref 0.0–3.0)
Eosinophils Absolute: 0.2 10*3/uL (ref 0.0–0.7)
Eosinophils Relative: 3.7 % (ref 0.0–5.0)
HCT: 41.9 % (ref 39.0–52.0)
Hemoglobin: 14.1 g/dL (ref 13.0–17.0)
Lymphocytes Relative: 19.1 % (ref 12.0–46.0)
Lymphs Abs: 1.2 10*3/uL (ref 0.7–4.0)
MCHC: 33.7 g/dL (ref 30.0–36.0)
MCV: 93.3 fl (ref 78.0–100.0)
MONOS PCT: 8.9 % (ref 3.0–12.0)
Monocytes Absolute: 0.6 10*3/uL (ref 0.1–1.0)
NEUTROS PCT: 67.9 % (ref 43.0–77.0)
Neutro Abs: 4.3 10*3/uL (ref 1.4–7.7)
PLATELETS: 182 10*3/uL (ref 150.0–400.0)
RBC: 4.49 Mil/uL (ref 4.22–5.81)
RDW: 12.9 % (ref 11.5–14.6)
WBC: 6.3 10*3/uL (ref 4.5–10.5)

## 2014-03-19 LAB — LIPID PANEL
Cholesterol: 223 mg/dL — ABNORMAL HIGH (ref 0–200)
HDL: 51.4 mg/dL (ref >39.00)
LDL Cholesterol: 96 mg/dL (ref 0–99)
Total CHOL/HDL Ratio: 4
Triglycerides: 377 mg/dL — ABNORMAL HIGH (ref 0.0–149.0)
VLDL: 75.4 mg/dL — ABNORMAL HIGH (ref 0.0–40.0)

## 2014-03-19 LAB — POCT URINALYSIS DIPSTICK
Bilirubin, UA: NEGATIVE
GLUCOSE UA: NEGATIVE
Ketones, UA: NEGATIVE
Leukocytes, UA: NEGATIVE
Nitrite, UA: NEGATIVE
RBC UA: NEGATIVE
SPEC GRAV UA: 1.025
Urobilinogen, UA: 0.2
pH, UA: 5.5

## 2014-03-19 LAB — TSH: TSH: 4.49 u[IU]/mL (ref 0.35–5.50)

## 2014-03-19 LAB — HEPATIC FUNCTION PANEL
ALT: 26 U/L (ref 0–53)
AST: 31 U/L (ref 0–37)
Albumin: 4 g/dL (ref 3.5–5.2)
Alkaline Phosphatase: 49 U/L (ref 39–117)
BILIRUBIN DIRECT: 0.1 mg/dL (ref 0.0–0.3)
BILIRUBIN TOTAL: 0.4 mg/dL (ref 0.3–1.2)
Total Protein: 6.9 g/dL (ref 6.0–8.3)

## 2014-03-19 LAB — PSA: PSA: 3.28 ng/mL (ref 0.10–4.00)

## 2014-03-26 ENCOUNTER — Encounter: Payer: Self-pay | Admitting: Family Medicine

## 2014-03-26 ENCOUNTER — Ambulatory Visit (INDEPENDENT_AMBULATORY_CARE_PROVIDER_SITE_OTHER): Payer: BC Managed Care – PPO | Admitting: Family Medicine

## 2014-03-26 VITALS — BP 148/92 | HR 72 | Temp 97.6°F | Ht 69.0 in | Wt 182.0 lb

## 2014-03-26 DIAGNOSIS — R7309 Other abnormal glucose: Secondary | ICD-10-CM

## 2014-03-26 DIAGNOSIS — R7303 Prediabetes: Secondary | ICD-10-CM

## 2014-03-26 DIAGNOSIS — Z0000 Encounter for general adult medical examination without abnormal findings: Secondary | ICD-10-CM

## 2014-03-26 NOTE — Progress Notes (Signed)
Pre visit review using our clinic review tool, if applicable. No additional management support is needed unless otherwise documented below in the visit note. 

## 2014-03-26 NOTE — Progress Notes (Signed)
   Subjective:    Patient ID: Daniel Sullivan, male    DOB: 1951-08-20, 63 y.o.   MRN: [id number]  HPI Patient for complete physical. His hypertension. His own 2 drug rash was losartan amlodipine he stopped amlodipine was on. He is blood pressures been stable home readings mostly around 494 systolic. No history of shingles vaccine. Last colonoscopy 10 years ago. Tetanus shot up-to-date.  Patient had elevated PSA last year. He saw a urologist. Prostate biopsies were negative. He exercises regularly. Nonsmoker. Takes pravastatin for hyperlipidemia. History of GERD which is been stable and well controlled  Reviewed with no changes  Past Medical History  Diagnosis Date  . Asthma   . GERD (gastroesophageal reflux disease)   . Hyperlipidemia   . Hypertension   . Allergy   . Diverticulosis    No past surgical history on file.  reports that he quit smoking about 26 years ago. His smoking use included Cigarettes. He has a 7.5 pack-year smoking history. He has never used smokeless tobacco. He reports that he drinks alcohol. He reports that he does not use illicit drugs. family history includes Alcohol abuse in his father; Cancer (age of onset: 7) in his brother. No Known Allergies    Review of Systems  Constitutional: Negative for fever, activity change, appetite change, fatigue and unexpected weight change.  HENT: Negative for congestion, ear pain and trouble swallowing.   Eyes: Negative for pain and visual disturbance.  Respiratory: Negative for cough, shortness of breath and wheezing.   Cardiovascular: Negative for chest pain and palpitations.  Gastrointestinal: Negative for nausea, vomiting, abdominal pain, diarrhea, constipation, blood in stool, abdominal distention and rectal pain.  Endocrine: Negative for polydipsia and polyuria.  Genitourinary: Negative for dysuria, hematuria and testicular pain.  Musculoskeletal: Negative for arthralgias and joint swelling.  Skin: Negative for rash.   Neurological: Negative for dizziness, syncope and headaches.  Hematological: Negative for adenopathy.  Psychiatric/Behavioral: Negative for confusion and dysphoric mood.       Objective:   Physical Exam  Constitutional: He is oriented to person, place, and time. He appears well-developed and well-nourished. No distress.  HENT:  Head: Normocephalic and atraumatic.  Right Ear: External ear normal.  Left Ear: External ear normal.  Mouth/Throat: Oropharynx is clear and moist.  Eyes: Conjunctivae and EOM are normal. Pupils are equal, round, and reactive to light.  Neck: Normal range of motion. Neck supple. No thyromegaly present.  Cardiovascular: Normal rate, regular rhythm and normal heart sounds.   No murmur heard. Pulmonary/Chest: No respiratory distress. He has no wheezes. He has no rales.  Abdominal: Soft. Bowel sounds are normal. He exhibits no distension and no mass. There is no tenderness. There is no rebound and no guarding.  Musculoskeletal: He exhibits no edema.  Lymphadenopathy:    He has no cervical adenopathy.  Neurological: He is alert and oriented to person, place, and time. He displays normal reflexes. No cranial nerve deficit.  Skin: No rash noted.  Psychiatric: He has a normal mood and affect.          Assessment & Plan:  Complete physical. Tetanus up-to-date. Schedule repeat colonoscopy. Patient will check on coverage for shingles vaccine. Handout on hypertriglyceridemia given. Continue close monitoring of blood pressure. He is slightly elevated today but well controlled by home readings and we have checked his cuff previously for accuracy with ours

## 2014-03-26 NOTE — Patient Instructions (Signed)
Hypertriglyceridemia  Diet for High blood levels of Triglycerides Most fats in food are triglycerides. Triglycerides in your blood are stored as fat in your body. High levels of triglycerides in your blood may put you at a greater risk for heart disease and stroke.  Normal triglyceride levels are less than 150 mg/dL. Borderline high levels are 150-199 mg/dl. High levels are 200 - 499 mg/dL, and very high triglyceride levels are greater than 500 mg/dL. The decision to treat high triglycerides is generally based on the level. For people with borderline or high triglyceride levels, treatment includes weight loss and exercise. Drugs are recommended for people with very high triglyceride levels. Many people who need treatment for high triglyceride levels have metabolic syndrome. This syndrome is a collection of disorders that often include: insulin resistance, high blood pressure, blood clotting problems, high cholesterol and triglycerides. TESTING PROCEDURE FOR TRIGLYCERIDES  You should not eat 4 hours before getting your triglycerides measured. The normal range of triglycerides is between 10 and 250 milligrams per deciliter (mg/dl). Some people may have extreme levels (1000 or above), but your triglyceride level may be too high if it is above 150 mg/dl, depending on what other risk factors you have for heart disease.  People with high blood triglycerides may also have high blood cholesterol levels. If you have high blood cholesterol as well as high blood triglycerides, your risk for heart disease is probably greater than if you only had high triglycerides. High blood cholesterol is one of the main risk factors for heart disease. CHANGING YOUR DIET  Your weight can affect your blood triglyceride level. If you are more than 20% above your ideal body weight, you may be able to lower your blood triglycerides by losing weight. Eating less and exercising regularly is the best way to combat this. Fat provides more  calories than any other food. The best way to lose weight is to eat less fat. Only 30% of your total calories should come from fat. Less than 7% of your diet should come from saturated fat. A diet low in fat and saturated fat is the same as a diet to decrease blood cholesterol. By eating a diet lower in fat, you may lose weight, lower your blood cholesterol, and lower your blood triglyceride level.  Eating a diet low in fat, especially saturated fat, may also help you lower your blood triglyceride level. Ask your dietitian to help you figure how much fat you can eat based on the number of calories your caregiver has prescribed for you.  Exercise, in addition to helping with weight loss may also help lower triglyceride levels.   Alcohol can increase blood triglycerides. You may need to stop drinking alcoholic beverages.  Too much carbohydrate in your diet may also increase your blood triglycerides. Some complex carbohydrates are necessary in your diet. These may include bread, rice, potatoes, other starchy vegetables and cereals.  Reduce "simple" carbohydrates. These may include pure sugars, candy, honey, and jelly without losing other nutrients. If you have the kind of high blood triglycerides that is affected by the amount of carbohydrates in your diet, you will need to eat less sugar and less high-sugar foods. Your caregiver can help you with this.  Adding 2-4 grams of fish oil (EPA+ DHA) may also help lower triglycerides. Speak with your caregiver before adding any supplements to your regimen. Following the Diet  Maintain your ideal weight. Your caregivers can help you with a diet. Generally, eating less food and getting more   exercise will help you lose weight. Joining a weight control group may also help. Ask your caregivers for a good weight control group in your area.  Eat low-fat foods instead of high-fat foods. This can help you lose weight too.  These foods are lower in fat. Eat MORE of these:    Dried beans, peas, and lentils.  Egg whites.  Low-fat cottage cheese.  Fish.  Lean cuts of meat, such as round, sirloin, rump, and flank (cut extra fat off meat you fix).  Whole grain breads, cereals and pasta.  Skim and nonfat dry milk.  Low-fat yogurt.  Poultry without the skin.  Cheese made with skim or part-skim milk, such as mozzarella, parmesan, farmers', ricotta, or pot cheese. These are higher fat foods. Eat LESS of these:   Whole milk and foods made from whole milk, such as American, blue, cheddar, monterey jack, and swiss cheese  High-fat meats, such as luncheon meats, sausages, knockwurst, bratwurst, hot dogs, ribs, corned beef, ground pork, and regular ground beef.  Fried foods. Limit saturated fats in your diet. Substituting unsaturated fat for saturated fat may decrease your blood triglyceride level. You will need to read package labels to know which products contain saturated fats.  These foods are high in saturated fat. Eat LESS of these:   Fried pork skins.  Whole milk.  Skin and fat from poultry.  Palm oil.  Butter.  Shortening.  Cream cheese.  Bacon.  Margarines and baked goods made from listed oils.  Vegetable shortenings.  Chitterlings.  Fat from meats.  Coconut oil.  Palm kernel oil.  Lard.  Cream.  Sour cream.  Fatback.  Coffee whiteners and non-dairy creamers made with these oils.  Cheese made from whole milk. Use unsaturated fats (both polyunsaturated and monounsaturated) moderately. Remember, even though unsaturated fats are better than saturated fats; you still want a diet low in total fat.  These foods are high in unsaturated fat:   Canola oil.  Sunflower oil.  Mayonnaise.  Almonds.  Peanuts.  Pine nuts.  Margarines made with these oils.  Safflower oil.  Olive oil.  Avocados.  Cashews.  Peanut butter.  Sunflower seeds.  Soybean oil.  Peanut  oil.  Olives.  Pecans.  Walnuts.  Pumpkin seeds. Avoid sugar and other high-sugar foods. This will decrease carbohydrates without decreasing other nutrients. Sugar in your food goes rapidly to your blood. When there is excess sugar in your blood, your liver may use it to make more triglycerides. Sugar also contains calories without other important nutrients.  Eat LESS of these:   Sugar, brown sugar, powdered sugar, jam, jelly, preserves, honey, syrup, molasses, pies, candy, cakes, cookies, frosting, pastries, colas, soft drinks, punches, fruit drinks, and regular gelatin.  Avoid alcohol. Alcohol, even more than sugar, may increase blood triglycerides. In addition, alcohol is high in calories and low in nutrients. Ask for sparkling water, or a diet soft drink instead of an alcoholic beverage. Suggestions for planning and preparing meals   Bake, broil, grill or roast meats instead of frying.  Remove fat from meats and skin from poultry before cooking.  Add spices, herbs, lemon juice or vinegar to vegetables instead of salt, rich sauces or gravies.  Use a non-stick skillet without fat or use no-stick sprays.  Cool and refrigerate stews and broth. Then remove the hardened fat floating on the surface before serving.  Refrigerate meat drippings and skim off fat to make low-fat gravies.  Serve more fish.  Use less butter,   margarine and other high-fat spreads on bread or vegetables.  Use skim or reconstituted non-fat dry milk for cooking.  Cook with low-fat cheeses.  Substitute low-fat yogurt or cottage cheese for all or part of the sour cream in recipes for sauces, dips or congealed salads.  Use half yogurt/half mayonnaise in salad recipes.  Substitute evaporated skim milk for cream. Evaporated skim milk or reconstituted non-fat dry milk can be whipped and substituted for whipped cream in certain recipes.  Choose fresh fruits for dessert instead of high-fat foods such as pies or  cakes. Fruits are naturally low in fat. When Dining Out   Order low-fat appetizers such as fruit or vegetable juice, pasta with vegetables or tomato sauce.  Select clear, rather than cream soups.  Ask that dressings and gravies be served on the side. Then use less of them.  Order foods that are baked, broiled, poached, steamed, stir-fried, or roasted.  Ask for margarine instead of butter, and use only a small amount.  Drink sparkling water, unsweetened tea or coffee, or diet soft drinks instead of alcohol or other sweet beverages. QUESTIONS AND ANSWERS ABOUT OTHER FATS IN THE BLOOD: SATURATED FAT, TRANS FAT, AND CHOLESTEROL What is trans fat? Trans fat is a type of fat that is formed when vegetable oil is hardened through a process called hydrogenation. This process helps makes foods more solid, gives them shape, and prolongs their shelf life. Trans fats are also called hydrogenated or partially hydrogenated oils.  What do saturated fat, trans fat, and cholesterol in foods have to do with heart disease? Saturated fat, trans fat, and cholesterol in the diet all raise the level of LDL "bad" cholesterol in the blood. The higher the LDL cholesterol, the greater the risk for coronary heart disease (CHD). Saturated fat and trans fat raise LDL similarly.  What foods contain saturated fat, trans fat, and cholesterol? High amounts of saturated fat are found in animal products, such as fatty cuts of meat, chicken skin, and full-fat dairy products like butter, whole milk, cream, and cheese, and in tropical vegetable oils such as palm, palm kernel, and coconut oil. Trans fat is found in some of the same foods as saturated fat, such as vegetable shortening, some margarines (especially hard or stick margarine), crackers, cookies, baked goods, fried foods, salad dressings, and other processed foods made with partially hydrogenated vegetable oils. Small amounts of trans fat also occur naturally in some animal  products, such as milk products, beef, and lamb. Foods high in cholesterol include liver, other organ meats, egg yolks, shrimp, and full-fat dairy products. How can I use the new food label to make heart-healthy food choices? Check the Nutrition Facts panel of the food label. Choose foods lower in saturated fat, trans fat, and cholesterol. For saturated fat and cholesterol, you can also use the Percent Daily Value (%DV): 5% DV or less is low, and 20% DV or more is high. (There is no %DV for trans fat.) Use the Nutrition Facts panel to choose foods low in saturated fat and cholesterol, and if the trans fat is not listed, read the ingredients and limit products that list shortening or hydrogenated or partially hydrogenated vegetable oil, which tend to be high in trans fat. POINTS TO REMEMBER:   Discuss your risk for heart disease with your caregivers, and take steps to reduce risk factors.  Change your diet. Choose foods that are low in saturated fat, trans fat, and cholesterol.  Add exercise to your daily routine if   it is not already being done. Participate in physical activity of moderate intensity, like brisk walking, for at least 30 minutes on most, and preferably all days of the week. No time? Break the 30 minutes into three, 10-minute segments during the day.  Stop smoking. If you do smoke, contact your caregiver to discuss ways in which they can help you quit.  Do not use street drugs.  Maintain a normal weight.  Maintain a healthy blood pressure.  Keep up with your blood work for checking the fats in your blood as directed by your caregiver. Document Released: 09/09/2004 Document Revised: 05/23/2012 Document Reviewed: 04/07/2009 [facility] Patient Information 2014 [place].  Check on coverage for shingles vaccine Schedule follow up colonoscopy

## 2014-06-10 ENCOUNTER — Ambulatory Visit (INDEPENDENT_AMBULATORY_CARE_PROVIDER_SITE_OTHER): Payer: BC Managed Care – PPO | Admitting: Podiatry

## 2014-06-10 ENCOUNTER — Encounter: Payer: Self-pay | Admitting: Podiatry

## 2014-06-10 DIAGNOSIS — M7742 Metatarsalgia, left foot: Principal | ICD-10-CM

## 2014-06-10 DIAGNOSIS — M7750 Other enthesopathy of unspecified foot: Secondary | ICD-10-CM

## 2014-06-10 DIAGNOSIS — M7741 Metatarsalgia, right foot: Secondary | ICD-10-CM

## 2014-06-10 NOTE — Progress Notes (Signed)
Orthotics are hurting too much with short distal length. Will return to lab to have them refurbished to full length.

## 2014-06-10 NOTE — Patient Instructions (Signed)
Change Orthotic to full length.

## 2014-08-15 ENCOUNTER — Ambulatory Visit (INDEPENDENT_AMBULATORY_CARE_PROVIDER_SITE_OTHER): Payer: BC Managed Care – PPO | Admitting: Family Medicine

## 2014-08-15 ENCOUNTER — Encounter: Payer: Self-pay | Admitting: Family Medicine

## 2014-08-15 VITALS — BP 132/80 | HR 90 | Temp 98.0°F | Wt 176.0 lb

## 2014-08-15 DIAGNOSIS — R5381 Other malaise: Secondary | ICD-10-CM

## 2014-08-15 DIAGNOSIS — R059 Cough, unspecified: Secondary | ICD-10-CM

## 2014-08-15 DIAGNOSIS — R05 Cough: Secondary | ICD-10-CM

## 2014-08-15 DIAGNOSIS — R5383 Other fatigue: Secondary | ICD-10-CM

## 2014-08-15 LAB — CBC WITH DIFFERENTIAL/PLATELET
BASOS PCT: 0.3 % (ref 0.0–3.0)
Basophils Absolute: 0 10*3/uL (ref 0.0–0.1)
EOS PCT: 0.9 % (ref 0.0–5.0)
Eosinophils Absolute: 0.1 10*3/uL (ref 0.0–0.7)
HCT: 36.9 % — ABNORMAL LOW (ref 39.0–52.0)
Hemoglobin: 12.2 g/dL — ABNORMAL LOW (ref 13.0–17.0)
LYMPHS ABS: 1 10*3/uL (ref 0.7–4.0)
Lymphocytes Relative: 9.1 % — ABNORMAL LOW (ref 12.0–46.0)
MCHC: 33.1 g/dL (ref 30.0–36.0)
MCV: 92.4 fl (ref 78.0–100.0)
MONOS PCT: 9.4 % (ref 3.0–12.0)
Monocytes Absolute: 1 10*3/uL (ref 0.1–1.0)
Neutro Abs: 8.7 10*3/uL — ABNORMAL HIGH (ref 1.4–7.7)
Neutrophils Relative %: 80.3 % — ABNORMAL HIGH (ref 43.0–77.0)
Platelets: 291 10*3/uL (ref 150.0–400.0)
RBC: 3.99 Mil/uL — AB (ref 4.22–5.81)
RDW: 13 % (ref 11.5–15.5)
WBC: 10.9 10*3/uL — ABNORMAL HIGH (ref 4.0–10.5)

## 2014-08-15 LAB — TSH: TSH: 0.95 u[IU]/mL (ref 0.35–4.50)

## 2014-08-15 NOTE — Patient Instructions (Signed)
Touch base in 1-2 weeks if cough not resolving and sooner for any fever or increasing shortness of breath.

## 2014-08-15 NOTE — Progress Notes (Signed)
   Subjective:    Patient ID: Daniel Sullivan, male    DOB: 11/24/1951, 63 y.o.   MRN: [id number]  Cough Pertinent negatives include no chest pain, chills, fever, headaches or shortness of breath.   Patient seen with 3 week history of cough. He's had increased fatigue during this time. Denies any fever. Cough is dry. Possibly some mild wheezing off and on but not sure. He denies any dyspnea. He is still exercising with cycling but has had great reduction in stamina. No chest pains. He had good appetite. Weight stable. Denies any major nasal congestion. Cough is slightly improved compared with last week. No hemoptysis. No pleuritic pain. Patient is an ex-smoker quit smoking many years ago after about a 13-pack-year history. He has good blood pressure control on losartan.  Patient had labs including thyroid function last April which were normal though his TSH has been steadily increasing. Denies any cold intolerance or constipation issues.  Past Medical History  Diagnosis Date  . Asthma   . GERD (gastroesophageal reflux disease)   . Hyperlipidemia   . Hypertension   . Allergy   . Diverticulosis    No past surgical history on file.  reports that he quit smoking about 26 years ago. His smoking use included Cigarettes. He has a 7.5 pack-year smoking history. He has never used smokeless tobacco. He reports that he drinks alcohol. He reports that he does not use illicit drugs. family history includes Alcohol abuse in his father; Cancer (age of onset: 84) in his brother. No Known Allergies    Review of Systems  Constitutional: Positive for fatigue. Negative for fever, chills, appetite change and unexpected weight change.  HENT: Negative for trouble swallowing.   Respiratory: Positive for cough. Negative for shortness of breath.   Cardiovascular: Negative for chest pain, palpitations and leg swelling.  Gastrointestinal: Negative for nausea, vomiting, abdominal pain and diarrhea.  Endocrine:  Negative for polydipsia and polyuria.  Genitourinary: Negative for dysuria.  Neurological: Negative for dizziness and headaches.       Objective:   Physical Exam  Constitutional: He is oriented to person, place, and time. He appears well-developed and well-nourished.  Neck: Neck supple. No thyromegaly present.  Cardiovascular: Normal rate and regular rhythm.  Exam reveals no gallop.   Pulmonary/Chest: Effort normal and breath sounds normal. No respiratory distress. He has no wheezes. He has no rales.  Abdominal: Soft. Bowel sounds are normal. He exhibits no distension and no mass. There is no tenderness. There is no rebound and no guarding.  Musculoskeletal: He exhibits no edema.  Lymphadenopathy:    He has no cervical adenopathy.  Neurological: He is alert and oriented to person, place, and time. No cranial nerve deficit.  Psychiatric: He has a normal mood and affect. His behavior is normal.          Assessment & Plan:  #1 dry cough. Suspect related to recent or bronchitis. Nonfocal exam. Observe for now. Consider chest x-ray if not resolving over the next week. He does not have any red flags such as appetite or weight changes or hemoptysis.  #2 fatigue. Possibly related to #1. Will repeat TSH and CBC. He denies any chest discomfort or dyspnea symptoms suggest cardiac origin

## 2014-08-15 NOTE — Progress Notes (Signed)
Pre visit review using our clinic review tool, if applicable. No additional management support is needed unless otherwise documented below in the visit note. 

## 2014-08-19 ENCOUNTER — Other Ambulatory Visit: Payer: Self-pay | Admitting: Family Medicine

## 2014-08-19 DIAGNOSIS — D649 Anemia, unspecified: Secondary | ICD-10-CM

## 2014-08-20 ENCOUNTER — Other Ambulatory Visit (INDEPENDENT_AMBULATORY_CARE_PROVIDER_SITE_OTHER): Payer: BC Managed Care – PPO

## 2014-08-20 DIAGNOSIS — D649 Anemia, unspecified: Secondary | ICD-10-CM

## 2014-08-20 LAB — IRON: IRON: 29 ug/dL — AB (ref 42–165)

## 2014-08-20 LAB — FERRITIN: Ferritin: 318.1 ng/mL (ref 22.0–322.0)

## 2014-08-20 LAB — VITAMIN B12: Vitamin B-12: 267 pg/mL (ref 211–911)

## 2014-08-21 ENCOUNTER — Ambulatory Visit (INDEPENDENT_AMBULATORY_CARE_PROVIDER_SITE_OTHER): Payer: BC Managed Care – PPO | Admitting: Family Medicine

## 2014-08-21 ENCOUNTER — Encounter: Payer: Self-pay | Admitting: Family Medicine

## 2014-08-21 VITALS — BP 120/74 | HR 93 | Wt 177.0 lb

## 2014-08-21 DIAGNOSIS — R059 Cough, unspecified: Secondary | ICD-10-CM

## 2014-08-21 DIAGNOSIS — D509 Iron deficiency anemia, unspecified: Secondary | ICD-10-CM

## 2014-08-21 DIAGNOSIS — R05 Cough: Secondary | ICD-10-CM

## 2014-08-21 NOTE — Progress Notes (Signed)
Pre visit review using our clinic review tool, if applicable. No additional management support is needed unless otherwise documented below in the visit note. 

## 2014-08-21 NOTE — Progress Notes (Signed)
   Subjective:    Patient ID: Daniel Sullivan, male    DOB: June 06, 1951, 63 y.o.   MRN: [id number]  Dizziness Associated symptoms include coughing and fatigue. Pertinent negatives include no abdominal pain, chest pain, chills, congestion or fever.   Persistent cough and fatigue. Refer to previous note:  "Patient seen with 3 week history of cough. He's had increased fatigue during this time. Denies any fever. Cough is dry. Possibly some mild wheezing off and on but not sure. He denies any dyspnea. He is still exercising with cycling but has had great reduction in stamina. No chest pains. He had good appetite. Weight stable. Denies any major nasal congestion. Cough is slightly improved compared with last week. No hemoptysis. No pleuritic pain. Patient is an ex-smoker quit smoking many years ago after about a 13-pack-year history. He has good blood pressure control on losartan."  We suspected acute viral process. His labs did have minimally elevated white count but he had no fever and nonfocal exam. Minimally low hemoglobin with subsequent slightly low iron level. He just saw a gastroenterologist and colonoscopy last week which is unremarkable. Cough is nonproductive. Occasionally wakes up at night. No hemoptysis. No appetite or weight changes. Mild dyspnea with activity but no chest pain.  Past Medical History  Diagnosis Date  . Asthma   . GERD (gastroesophageal reflux disease)   . Hyperlipidemia   . Hypertension   . Allergy   . Diverticulosis    No past surgical history on file.  reports that he quit smoking about 26 years ago. His smoking use included Cigarettes. He has a 7.5 pack-year smoking history. He has never used smokeless tobacco. He reports that he drinks alcohol. He reports that he does not use illicit drugs. family history includes Alcohol abuse in his father; Cancer (age of onset: 74) in his brother. No Known Allergies       Review of Systems  Constitutional: Positive for  fatigue. Negative for fever, chills, appetite change and unexpected weight change.  HENT: Negative for congestion.   Respiratory: Positive for cough. Negative for wheezing.   Cardiovascular: Negative for chest pain and leg swelling.  Gastrointestinal: Negative for abdominal pain.  Neurological: Negative for dizziness.       Objective:   Physical Exam  Constitutional: He appears well-developed and well-nourished. No distress.  Neck: Neck supple.  Cardiovascular: Normal rate and regular rhythm.   Pulmonary/Chest: Effort normal and breath sounds normal. No respiratory distress. He has no wheezes. He has no rales.  Musculoskeletal: He exhibits no edema.  Lymphadenopathy:    He has no cervical adenopathy.          Assessment & Plan:  Persistent cough. He has associated fatigue. Nonfocal exam and no fever. Obtain chest x-ray given duration.  Recent minimally low hemoglobin and low iron. He just saw a gastroenterologist and had colonoscopy. No upper endoscopy. Hemoccult given.

## 2014-08-22 ENCOUNTER — Other Ambulatory Visit (INDEPENDENT_AMBULATORY_CARE_PROVIDER_SITE_OTHER): Payer: BC Managed Care – PPO

## 2014-08-22 ENCOUNTER — Telehealth: Payer: Self-pay

## 2014-08-22 ENCOUNTER — Other Ambulatory Visit: Payer: Self-pay

## 2014-08-22 ENCOUNTER — Ambulatory Visit (INDEPENDENT_AMBULATORY_CARE_PROVIDER_SITE_OTHER)
Admission: RE | Admit: 2014-08-22 | Discharge: 2014-08-22 | Disposition: A | Discharge status: Home / Self Care | Payer: BC Managed Care – PPO | Source: Ambulatory Visit | Attending: Family Medicine | Admitting: Family Medicine

## 2014-08-22 ENCOUNTER — Other Ambulatory Visit: Payer: Self-pay | Admitting: Family Medicine

## 2014-08-22 DIAGNOSIS — R05 Cough: Secondary | ICD-10-CM

## 2014-08-22 DIAGNOSIS — R195 Other fecal abnormalities: Secondary | ICD-10-CM

## 2014-08-22 DIAGNOSIS — R059 Cough, unspecified: Secondary | ICD-10-CM

## 2014-08-22 LAB — POC HEMOCCULT BLD/STL (HOME/3-CARD/SCREEN)
Card #2 Date: NEGATIVE
Card #2 Fecal Occult Blod, POC: NEGATIVE
Card #3 Date: NEGATIVE
Card #3 Fecal Occult Blood, POC: NEGATIVE
Fecal Occult Blood, POC: NEGATIVE
OCCULT BLOOD DATE: NEGATIVE

## 2014-08-22 MED ORDER — LEVOFLOXACIN 750 MG PO TABS: 750.0000 mg | ORAL_TABLET | Freq: Every day | ORAL | Status: DC

## 2014-08-22 NOTE — Telephone Encounter (Signed)
Pt chest xray results are in chart.

## 2014-08-27 ENCOUNTER — Telehealth: Payer: Self-pay | Admitting: Family Medicine

## 2014-08-27 NOTE — Telephone Encounter (Signed)
Pt informed that he needs to finish the medication.

## 2014-08-27 NOTE — Telephone Encounter (Signed)
Pt insist he needs to speak w/ you about a med. Refused triage for this issue.  pls call

## 2014-08-28 ENCOUNTER — Encounter: Payer: Self-pay | Admitting: Family Medicine

## 2014-08-28 ENCOUNTER — Telehealth: Payer: Self-pay | Admitting: Family Medicine

## 2014-08-28 MED ORDER — BENZONATATE 200 MG PO CAPS: 200.0000 mg | ORAL_CAPSULE | Freq: Three times a day (TID) | ORAL | Status: DC | PRN

## 2014-08-28 NOTE — Telephone Encounter (Signed)
RX sent to pharmacy and pt is aware.

## 2014-08-28 NOTE — Telephone Encounter (Signed)
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  Daniel Sullivan 200 mg po q 8 hours prn cough #30

## 2014-08-28 NOTE — Telephone Encounter (Signed)
Pt is having coughing spells that last for up to 10 mins daily. Would like a cough med called in for him.

## 2014-09-04 ENCOUNTER — Other Ambulatory Visit: Payer: Self-pay | Admitting: Family Medicine

## 2014-09-05 ENCOUNTER — Ambulatory Visit (INDEPENDENT_AMBULATORY_CARE_PROVIDER_SITE_OTHER)
Admission: RE | Admit: 2014-09-05 | Discharge: 2014-09-05 | Disposition: A | Discharge status: Home / Self Care | Payer: BC Managed Care – PPO | Source: Ambulatory Visit | Attending: Family Medicine | Admitting: Family Medicine

## 2014-09-05 DIAGNOSIS — R05 Cough: Secondary | ICD-10-CM

## 2014-09-05 DIAGNOSIS — R059 Cough, unspecified: Secondary | ICD-10-CM

## 2014-09-06 ENCOUNTER — Other Ambulatory Visit: Payer: Self-pay | Admitting: Family Medicine

## 2014-09-06 DIAGNOSIS — R059 Cough, unspecified: Secondary | ICD-10-CM

## 2014-09-06 DIAGNOSIS — R05 Cough: Secondary | ICD-10-CM

## 2014-10-08 ENCOUNTER — Other Ambulatory Visit: Payer: Self-pay | Admitting: Family Medicine

## 2014-11-26 ENCOUNTER — Other Ambulatory Visit: Payer: Self-pay | Admitting: Family Medicine

## 2015-03-06 ENCOUNTER — Telehealth: Payer: Self-pay | Admitting: Family Medicine

## 2015-03-06 ENCOUNTER — Encounter: Payer: Self-pay | Admitting: Family Medicine

## 2015-03-06 ENCOUNTER — Ambulatory Visit (INDEPENDENT_AMBULATORY_CARE_PROVIDER_SITE_OTHER): Payer: Medicare Other | Admitting: Family Medicine

## 2015-03-06 VITALS — BP 140/90 | HR 87 | Temp 98.4°F | Wt 175.0 lb

## 2015-03-06 DIAGNOSIS — T148 Other injury of unspecified body region: Secondary | ICD-10-CM | POA: Diagnosis not present

## 2015-03-06 DIAGNOSIS — W57XXXA Bitten or stung by nonvenomous insect and other nonvenomous arthropods, initial encounter: Secondary | ICD-10-CM

## 2015-03-06 NOTE — Progress Notes (Signed)
   Subjective:    Patient ID: Daniel Sullivan, male    DOB: January 28, 1951, 64 y.o.   MRN: [id number]  HPI  Tick bite last week left upper anterior thigh. He brings in the tick which appears to be a small deer tick. He had localized itching and redness. He denies any headaches, arthralgias, or fever. He's had some mild lightheadedness but has had this intermittently for several years. No other new symptoms. No other areas of rash.  Past Medical History  Diagnosis Date  . Asthma   . GERD (gastroesophageal reflux disease)   . Hyperlipidemia   . Hypertension   . Allergy   . Diverticulosis    No past surgical history on file.  reports that he quit smoking about 27 years ago. His smoking use included Cigarettes. He has a 7.5 pack-year smoking history. He has never used smokeless tobacco. He reports that he drinks alcohol. He reports that he does not use illicit drugs. family history includes Alcohol abuse in his father; Cancer (age of onset: 24) in his brother. No Known Allergies   Review of Systems  Constitutional: Negative for fever and chills.  Respiratory: Negative for shortness of breath.   Cardiovascular: Negative for chest pain.  Musculoskeletal: Negative for arthralgias.  Skin: Positive for rash.  Neurological: Negative for headaches.       Objective:   Physical Exam  Constitutional: He appears well-developed and well-nourished. No distress.  Cardiovascular: Normal rate and regular rhythm.   Pulmonary/Chest: Effort normal and breath sounds normal. No respiratory distress. He has no wheezes. He has no rales.  Skin:  Patient has localized area of erythema left upper anterior thigh. Approximate 1 cm diameter. No retained tick parts noted. No pustules. No vesicles          Assessment & Plan:  Tick bite (deer tick confirmed as pt brought tick in) with local allergic reaction. No erythema migrans and low endemic area with no other worrisome symptoms. Observe for now. Follow-up  promptly for arthralgias, fever, or new rash.

## 2015-03-06 NOTE — Progress Notes (Signed)
Pre visit review using our clinic review tool, if applicable. No additional management support is needed unless otherwise documented below in the visit note. 

## 2015-03-06 NOTE — Telephone Encounter (Signed)
Patient Name: Daniel Sullivan DOB: October 18, 1951 Initial Comment Caller states he was bit by a tick. It is swollen. Nurse Assessment Nurse: Marcelline Deist, RN, [place] Date/Time (Eastern Time): 03/06/2015 1:37:49 PM Confirm and document reason for call. If symptomatic, describe symptoms. ---Caller states he was bit by a tick on left leg, upper thigh. Noticed it on Friday, pulled it off on Sunday, not sure if head is still in or not, maybe a deer tick. It is swollen & pink/red, size of dime or less. Has the patient traveled out of the country within the last 30 days? ---Not Applicable Does the patient require triage? ---Yes Related visit to physician within the last 2 weeks? ---No Does the PT have any chronic conditions? (i.e. diabetes, asthma, etc.) ---No Guidelines Guideline Title Affirmed Question Affirmed Notes Tick Bite Can't remove tick's head that was broken off in the skin (after trying Care Advice) Final Disposition User See Physician within [facility], RN, [place] states there are maybe 5 1' faint lines coming from the area. He had used peroxide to cleanse area.

## 2015-03-06 NOTE — Patient Instructions (Signed)
Tick Bite Information Ticks are insects that attach themselves to the skin and draw blood for food. There are various types of ticks. Common types include wood ticks and deer ticks. Most ticks live in shrubs and grassy areas. Ticks can climb onto your body when you make contact with leaves or grass where the tick is waiting. The most common places on the body for ticks to attach themselves are the scalp, neck, armpits, waist, and groin. Most tick bites are harmless, but sometimes ticks carry germs that cause diseases. These germs can be spread to a person during the tick's feeding process. The chance of a disease spreading through a tick bite depends on:   The type of tick.  Time of year.   How long the tick is attached.   Geographic location.  HOW CAN YOU PREVENT TICK BITES? Take these steps to help prevent tick bites when you are outdoors:  Wear protective clothing. Long sleeves and long pants are best.   Wear white clothes so you can see ticks more easily.  Tuck your pant legs into your socks.   If walking on a trail, stay in the middle of the trail to avoid brushing against bushes.  Avoid walking through areas with long grass.  Put insect repellent on all exposed skin and along boot tops, pant legs, and sleeve cuffs.   Check clothing, hair, and skin repeatedly and before going inside.   Brush off any ticks that are not attached.  Take a shower or bath as soon as possible after being outdoors.  WHAT IS THE PROPER WAY TO REMOVE A TICK? Ticks should be removed as soon as possible to help prevent diseases caused by tick bites. 1. If latex gloves are available, put them on before trying to remove a tick.  2. Using fine-point tweezers, grasp the tick as close to the skin as possible. You may also use curved forceps or a tick removal tool. Grasp the tick as close to its head as possible. Avoid grasping the tick on its body. 3. Pull gently with steady upward pressure until  the tick lets go. Do not twist the tick or jerk it suddenly. This may break off the tick's head or mouth parts. 4. Do not squeeze or crush the tick's body. This could force disease-carrying fluids from the tick into your body.  5. After the tick is removed, wash the bite area and your hands with soap and water or other disinfectant such as alcohol. 6. Apply a small amount of antiseptic cream or ointment to the bite site.  7. Wash and disinfect any instruments that were used.  Do not try to remove a tick by applying a hot match, petroleum jelly, or fingernail polish to the tick. These methods do not work and may increase the chances of disease being spread from the tick bite.  WHEN SHOULD YOU SEEK MEDICAL CARE? Contact your health care provider if you are unable to remove a tick from your skin or if a part of the tick breaks off and is stuck in the skin.  After a tick bite, you need to be aware of signs and symptoms that could be related to diseases spread by ticks. Contact your health care provider if you develop any of the following in the days or weeks after the tick bite:  Unexplained fever.  Rash. A circular rash that appears days or weeks after the tick bite may indicate the possibility of Lyme disease. The rash may resemble   a target with a bull's-eye and may occur at a different part of your body than the tick bite.  Redness and swelling in the area of the tick bite.   Tender, swollen lymph glands.   Diarrhea.   Weight loss.   Cough.   Fatigue.   Muscle, joint, or bone pain.   Abdominal pain.   Headache.   Lethargy or a change in your level of consciousness.  Difficulty walking or moving your legs.   Numbness in the legs.   Paralysis.  Shortness of breath.   Confusion.   Repeated vomiting.  Document Released: 11/19/2000 Document Revised: 09/12/2013 Document Reviewed: 05/02/2013 ExitCare Patient Information 2015 [employer]. This information is  not intended to replace advice given to you by your health care provider. Make sure you discuss any questions you have with your health care provider.  

## 2015-03-06 NOTE — Telephone Encounter (Signed)
Noted  

## 2015-03-13 ENCOUNTER — Other Ambulatory Visit (INDEPENDENT_AMBULATORY_CARE_PROVIDER_SITE_OTHER): Payer: Medicare Other

## 2015-03-13 DIAGNOSIS — Z0000 Encounter for general adult medical examination without abnormal findings: Secondary | ICD-10-CM | POA: Diagnosis not present

## 2015-03-13 DIAGNOSIS — E291 Testicular hypofunction: Secondary | ICD-10-CM

## 2015-03-13 LAB — CBC WITH DIFFERENTIAL/PLATELET
Basophils Absolute: 0 10*3/uL (ref 0.0–0.1)
Basophils Relative: 0.4 % (ref 0.0–3.0)
Eosinophils Absolute: 0.3 10*3/uL (ref 0.0–0.7)
Eosinophils Relative: 4.3 % (ref 0.0–5.0)
HCT: 42.4 % (ref 39.0–52.0)
Hemoglobin: 14.6 g/dL (ref 13.0–17.0)
Lymphocytes Relative: 20.4 % (ref 12.0–46.0)
Lymphs Abs: 1.2 10*3/uL (ref 0.7–4.0)
MCHC: 34.3 g/dL (ref 30.0–36.0)
MCV: 93.1 fl (ref 78.0–100.0)
MONO ABS: 0.6 10*3/uL (ref 0.1–1.0)
Monocytes Relative: 9.7 % (ref 3.0–12.0)
NEUTROS PCT: 65.2 % (ref 43.0–77.0)
Neutro Abs: 3.8 10*3/uL (ref 1.4–7.7)
PLATELETS: 225 10*3/uL (ref 150.0–400.0)
RBC: 4.56 Mil/uL (ref 4.22–5.81)
RDW: 12.9 % (ref 11.5–15.5)
WBC: 5.9 10*3/uL (ref 4.0–10.5)

## 2015-03-13 LAB — PSA: PSA: 4.54 ng/mL — AB (ref 0.10–4.00)

## 2015-03-13 LAB — LIPID PANEL
CHOL/HDL RATIO: 3
Cholesterol: 210 mg/dL — ABNORMAL HIGH (ref 0–200)
HDL: 60.1 mg/dL (ref >39.00)
LDL CALC: 125 mg/dL — AB (ref 0–99)
NonHDL: 149.9
TRIGLYCERIDES: 124 mg/dL (ref 0.0–149.0)
VLDL: 24.8 mg/dL (ref 0.0–40.0)

## 2015-03-13 LAB — TESTOSTERONE: Testosterone: 236.7 ng/dL — ABNORMAL LOW (ref 300.00–890.00)

## 2015-03-13 LAB — HEPATIC FUNCTION PANEL
ALK PHOS: 56 U/L (ref 39–117)
ALT: 18 U/L (ref 0–53)
AST: 27 U/L (ref 0–37)
Albumin: 4.3 g/dL (ref 3.5–5.2)
BILIRUBIN TOTAL: 0.7 mg/dL (ref 0.2–1.2)
Bilirubin, Direct: 0.1 mg/dL (ref 0.0–0.3)
Total Protein: 7.2 g/dL (ref 6.0–8.3)

## 2015-03-13 LAB — BASIC METABOLIC PANEL
BUN: 10 mg/dL (ref 6–23)
CHLORIDE: 101 meq/L (ref 96–112)
CO2: 32 mEq/L (ref 19–32)
Calcium: 9.4 mg/dL (ref 8.4–10.5)
Creatinine, Ser: 1.09 mg/dL (ref 0.40–1.50)
GFR: 72.14 mL/min (ref >60.00)
Glucose, Bld: 114 mg/dL — ABNORMAL HIGH (ref 70–99)
POTASSIUM: 5.1 meq/L (ref 3.5–5.1)
SODIUM: 138 meq/L (ref 135–145)

## 2015-03-13 LAB — TSH: TSH: 4.64 u[IU]/mL — ABNORMAL HIGH (ref 0.35–4.50)

## 2015-03-17 ENCOUNTER — Other Ambulatory Visit: Payer: BC Managed Care – PPO

## 2015-03-18 ENCOUNTER — Other Ambulatory Visit: Payer: BC Managed Care – PPO

## 2015-03-21 ENCOUNTER — Other Ambulatory Visit: Payer: BC Managed Care – PPO

## 2015-03-28 ENCOUNTER — Encounter: Payer: BC Managed Care – PPO | Admitting: Family Medicine

## 2015-04-01 ENCOUNTER — Ambulatory Visit (INDEPENDENT_AMBULATORY_CARE_PROVIDER_SITE_OTHER): Payer: Medicare Other | Admitting: Family Medicine

## 2015-04-01 ENCOUNTER — Other Ambulatory Visit: Payer: Self-pay | Admitting: Family Medicine

## 2015-04-01 ENCOUNTER — Encounter: Payer: Self-pay | Admitting: Family Medicine

## 2015-04-01 VITALS — BP 146/102 | HR 76 | Temp 97.9°F | Ht 69.0 in | Wt 175.0 lb

## 2015-04-01 DIAGNOSIS — R7989 Other specified abnormal findings of blood chemistry: Secondary | ICD-10-CM

## 2015-04-01 DIAGNOSIS — Z0000 Encounter for general adult medical examination without abnormal findings: Secondary | ICD-10-CM

## 2015-04-01 DIAGNOSIS — Z23 Encounter for immunization: Secondary | ICD-10-CM

## 2015-04-01 NOTE — Progress Notes (Signed)
Pre visit review using our clinic review tool, if applicable. No additional management support is needed unless otherwise documented below in the visit note. 

## 2015-04-01 NOTE — Patient Instructions (Signed)
Check on insurance coverage for shingles vaccine. Let us know if your interested in getting We will need to look at Pneumovax in 1 year. You received Prevnar 13 today. Return in 4 months for repeat TSH and free T4 to assess thyroid status

## 2015-04-01 NOTE — Progress Notes (Signed)
   Subjective:    Patient ID: Daniel Sullivan, male    DOB: Dec 13, 1950, 64 y.o.   MRN: [id number]  HPI Patient here for complete physical.    chronic problems include history of low testosterone, elevated PSA, hyperlipidemia, hypertension, prediabetes. He has been out of his losartan for the past few days. No history of shingles vaccine. Elevated PSA followed by urology. No history of Prevnar 13. He stays quite active. Exercises regularly. No symptoms of polyuria or polydipsia.  No recent chest pain or dyspnea.    Past Medical History  Diagnosis Date  . Asthma   . GERD (gastroesophageal reflux disease)   . Hyperlipidemia   . Hypertension   . Allergy   . Diverticulosis    No past surgical history on file.  reports that he quit smoking about 27 years ago. His smoking use included Cigarettes. He has a 7.5 pack-year smoking history. He has never used smokeless tobacco. He reports that he drinks alcohol. He reports that he does not use illicit drugs. family history includes Alcohol abuse in his father; Cancer (age of onset: 77) in his brother. No Known Allergies    Review of Systems  Constitutional: Negative for fever, activity change, appetite change and fatigue.  HENT: Negative for congestion, ear pain and trouble swallowing.   Eyes: Negative for pain and visual disturbance.  Respiratory: Negative for cough, shortness of breath and wheezing.   Cardiovascular: Negative for chest pain and palpitations.  Gastrointestinal: Negative for nausea, vomiting, abdominal pain, diarrhea, constipation, blood in stool, abdominal distention and rectal pain.  Genitourinary: Negative for dysuria, hematuria and testicular pain.  Musculoskeletal: Positive for arthralgias. Negative for joint swelling.  Skin: Negative for rash.  Neurological: Negative for dizziness, syncope and headaches.  Hematological: Negative for adenopathy.  Psychiatric/Behavioral: Negative for confusion and dysphoric mood.         Objective:   Physical Exam  Constitutional: He is oriented to person, place, and time. He appears well-developed and well-nourished. No distress.  HENT:  Head: Normocephalic and atraumatic.  Right Ear: External ear normal.  Left Ear: External ear normal.  Mouth/Throat: Oropharynx is clear and moist.  Eyes: Conjunctivae and EOM are normal. Pupils are equal, round, and reactive to light.  Neck: Normal range of motion. Neck supple. No thyromegaly present.  Cardiovascular: Normal rate, regular rhythm and normal heart sounds.   No murmur heard. Pulmonary/Chest: No respiratory distress. He has no wheezes. He has no rales.  Abdominal: Soft. Bowel sounds are normal. He exhibits no distension and no mass. There is no tenderness. There is no rebound and no guarding.  Musculoskeletal: He exhibits no edema.  Lymphadenopathy:    He has no cervical adenopathy.  Neurological: He is alert and oriented to person, place, and time. He displays normal reflexes. No cranial nerve deficit.  Skin: No rash noted.  Psychiatric: He has a normal mood and affect.          Assessment & Plan:  Complete physical. Labs reviewed. He has slightly elevated TSH-?subclinical hypothyroidism.. Return in 4 months for repeat TSH and free T4. Check on coverage for shingles vaccine. Prevnar 13 given and in 1 year Pneumovax. Fax recent labs to his urologist.  His blood pressure was up slightly today but he has been off Losartan for about 3 days.  Get back on medication.

## 2015-04-08 ENCOUNTER — Telehealth: Payer: Self-pay | Admitting: Family Medicine

## 2015-04-08 ENCOUNTER — Ambulatory Visit (INDEPENDENT_AMBULATORY_CARE_PROVIDER_SITE_OTHER): Payer: Medicare Other | Admitting: Family Medicine

## 2015-04-08 DIAGNOSIS — Z23 Encounter for immunization: Secondary | ICD-10-CM | POA: Diagnosis not present

## 2015-04-09 NOTE — Telephone Encounter (Signed)
Error

## 2015-04-25 ENCOUNTER — Other Ambulatory Visit: Payer: Self-pay | Admitting: Family Medicine

## 2015-05-08 ENCOUNTER — Encounter: Payer: Self-pay | Admitting: Family Medicine

## 2015-05-08 ENCOUNTER — Ambulatory Visit (INDEPENDENT_AMBULATORY_CARE_PROVIDER_SITE_OTHER): Payer: Medicare Other | Admitting: Family Medicine

## 2015-05-08 VITALS — BP 134/80 | HR 88 | Temp 97.9°F | Wt 181.0 lb

## 2015-05-08 DIAGNOSIS — R062 Wheezing: Secondary | ICD-10-CM | POA: Diagnosis not present

## 2015-05-08 MED ORDER — ALBUTEROL SULFATE HFA 108 (90 BASE) MCG/ACT IN AERS: 2.0000 | INHALATION_SPRAY | Freq: Four times a day (QID) | RESPIRATORY_TRACT | Status: DC | PRN

## 2015-05-08 MED ORDER — PREDNISONE 20 MG PO TABS: ORAL_TABLET | ORAL | Status: DC

## 2015-05-08 NOTE — Patient Instructions (Signed)
Bronchospasm A bronchospasm is when the tubes that carry air in and out of your lungs (airways) spasm or tighten. During a bronchospasm it is hard to breathe. This is because the airways get smaller. A bronchospasm can be triggered by:  Allergies. These may be to animals, pollen, food, or mold.  Infection. This is a common cause of bronchospasm.  Exercise.  Irritants. These include pollution, cigarette smoke, strong odors, aerosol sprays, and paint fumes.  Weather changes.  Stress.  Being emotional. HOME CARE   Always have a plan for getting help. Know when to call your doctor and local emergency services (911 in the [place] Know where you can get emergency care.  Only take medicines as told by your doctor.  If you were prescribed an inhaler or nebulizer machine, ask your doctor how to use it correctly. Always use a spacer with your inhaler if you were given one.  Stay calm during an attack. Try to relax and breathe more slowly.  Control your home environment:  Change your heating and air conditioning filter at least once a month.  Limit your use of fireplaces and wood stoves.  Do not  smoke. Do not  allow smoking in your home.  Avoid perfumes and fragrances.  Get rid of pests (such as roaches and mice) and their droppings.  Throw away plants if you see mold on them.  Keep your house clean and dust free.  Replace carpet with wood, tile, or vinyl flooring. Carpet can trap dander and dust.  Use allergy-proof pillows, mattress covers, and box spring covers.  Wash bed sheets and blankets every week in hot water. Dry them in a dryer.  Use blankets that are made of polyester or cotton.  Wash hands frequently. GET HELP IF:  You have muscle aches.  You have chest pain.  The thick spit you spit or cough up (sputum) changes from clear or white to yellow, green, gray, or bloody.  The thick spit you spit or cough up gets thicker.  There are problems that may be related  to the medicine you are given such as:  A rash.  Itching.  Swelling.  Trouble breathing. GET HELP RIGHT AWAY IF:  You feel you cannot breathe or catch your breath.  You cannot stop coughing.  Your treatment is not helping you breathe better.  You have very bad chest pain. MAKE SURE YOU:   Understand these instructions.  Will watch your condition.  Will get help right away if you are not doing well or get worse. Document Released: 09/19/2009 Document Revised: 11/27/2013 Document Reviewed: 05/15/2013 ExitCare Patient Information 2015 [employer]. This information is not intended to replace advice given to you by your health care provider. Make sure you discuss any questions you have with your health care provider.  

## 2015-05-08 NOTE — Progress Notes (Signed)
Pre visit review using our clinic review tool, if applicable. No additional management support is needed unless otherwise documented below in the visit note. 

## 2015-05-08 NOTE — Progress Notes (Signed)
   Subjective:    Patient ID: Daniel Sullivan, male    DOB: 1951/10/02, 64 y.o.   MRN: [id number]  HPI  Patient seen for acute visit. Onset a few days ago dry cough. Had some mild nasal congestion. Sore throat at night. He's had wheezing off and on. No fever. Mild body aches. No localized sinus pressure. Some increased fatigue. Denies any nausea, vomiting, or diarrhea.  Past Medical History  Diagnosis Date  . Asthma   . GERD (gastroesophageal reflux disease)   . Hyperlipidemia   . Hypertension   . Allergy   . Diverticulosis    No past surgical history on file.  reports that he quit smoking about 27 years ago. His smoking use included Cigarettes. He has a 7.5 pack-year smoking history. He has never used smokeless tobacco. He reports that he drinks alcohol. He reports that he does not use illicit drugs. family history includes Alcohol abuse in his father; Cancer (age of onset: 75) in his brother. No Known Allergies   Review of Systems  Constitutional: Positive for fatigue. Negative for fever and chills.  HENT: Positive for congestion and sore throat.   Respiratory: Positive for cough and wheezing.        Objective:   Physical Exam  Constitutional: He appears well-developed and well-nourished.  HENT:  Right Ear: External ear normal.  Left Ear: External ear normal.  Mouth/Throat: Oropharynx is clear and moist.  Neck: Neck supple.  Cardiovascular: Normal rate and regular rhythm.   Pulmonary/Chest: Effort normal. No respiratory distress. He has wheezes. He has no rales.  Lymphadenopathy:    He has no cervical adenopathy.          Assessment & Plan:  Cough with wheezing. Suspect viral trigger. No respiratory distress. Diffuse wheezing on exam. Prednisone 20 mg 2 tablets daily. Pro-air inhaler 2 puffs every 4-6 hours as needed. Follow-up for any fever or persistent symptoms

## 2015-05-30 ENCOUNTER — Ambulatory Visit (INDEPENDENT_AMBULATORY_CARE_PROVIDER_SITE_OTHER): Payer: Medicare Other | Admitting: Family Medicine

## 2015-05-30 ENCOUNTER — Ambulatory Visit (INDEPENDENT_AMBULATORY_CARE_PROVIDER_SITE_OTHER)
Admission: RE | Admit: 2015-05-30 | Discharge: 2015-05-30 | Disposition: A | Discharge status: Home / Self Care | Payer: Medicare Other | Source: Ambulatory Visit | Attending: Family Medicine | Admitting: Family Medicine

## 2015-05-30 ENCOUNTER — Encounter: Payer: Self-pay | Admitting: Family Medicine

## 2015-05-30 VITALS — BP 135/88 | HR 84 | Temp 98.6°F | Wt 179.0 lb

## 2015-05-30 DIAGNOSIS — R062 Wheezing: Secondary | ICD-10-CM | POA: Diagnosis not present

## 2015-05-30 DIAGNOSIS — R05 Cough: Secondary | ICD-10-CM

## 2015-05-30 DIAGNOSIS — R052 Subacute cough: Secondary | ICD-10-CM

## 2015-05-30 MED ORDER — PREDNISONE 20 MG PO TABS: ORAL_TABLET | ORAL | Status: DC

## 2015-05-30 NOTE — Addendum Note (Signed)
Addended by: Marin Olp on: 05/30/2015 05:17 PM   Modules accepted: Orders

## 2015-05-30 NOTE — Progress Notes (Addendum)
Garret Reddish, MD  Subjective:  Daniel Sullivan is a 64 y.o. year old very pleasant male patient who presents with:  Subacute cough with wheeze -Cold for 6 weeks now. Daily cough, mostly dry with occasional chest rattle. Still having occasional wheeze. Patient 3 weeks ago was given prednisone 6 days and was feeling better but did not go away completely. Used inhaler today for the first time in a few weeks and hasn't coughed for 15 minutes which is much improved. Blowing nose with minimal drainage 2-3 a day. No sinus pressure. No ear pain. Rode 6-8 miles on his bike and felt very tired when usually can do 25 miles once or twice a week.   ROS- no fevers/chills/nausea. Some exercise intolerance due to fatigue with mild shortness of breath but none at rest. No watery/itchy eyes/sneezing/rhinorrhea  Past Medical History- hyperlipidemia, hypertension, GERD, former smoker quit 27 years ago with 7.5 pack years.   Medications- reviewed and updated Current Outpatient Prescriptions  Medication Sig Dispense Refill  . fluticasone (FLONASE) 50 MCG/ACT nasal spray  For fall allergies USE TWO SPRAYS IN EACH NOSTRIL DAILY as needed. 16 g 2  . Glucosamine-Chondroit-Vit C-Mn TABS Take by mouth daily.     Marland Kitchen losartan (COZAAR) 50 MG tablet TAKE ONE TABLET BY MOUTH ONE TIME DAILY 30 tablet 5  . Multiple Vitamins-Minerals (MENS MULTI VITAMIN & MINERAL PO) Take by mouth daily.      Marland Kitchen omeprazole (PRILOSEC) 40 MG capsule TAKE ONE CAPSULE BY MOUTH ONE TIME DAILY 30 capsule 5  . pravastatin (PRAVACHOL) 40 MG tablet TAKE ONE TABLET BY MOUTH ONE TIME DAILY 30 tablet 5  . albuterol (PROVENTIL HFA;VENTOLIN HFA) 108 (90 BASE) MCG/ACT inhaler Inhale 2 puffs into the lungs every 6 (six) hours as needed for wheezing or shortness of breath. (Patient not taking: Reported on 05/30/2015) 1 Inhaler 0   Objective: BP 135/88 mmHg  Pulse 84  Temp(Src) 98.6 F (37 C)  Wt 179 lb (81.194 kg)  SpO2 94% Gen: NAD, resting  comfortably Nares normal, oropharynx normal perhaps mild pharyngeal erythema, TM normal CV: RRR no murmurs rubs or gallops Lungs: CTAB no crackles, wheeze, rhonchi (albuterol 15 minute prior) Abdomen: soft/nontender/nondistended/normal bowel sounds. Ext: no edema, no calf tenderness Skin: warm, dry, no rash   Assessment/Plan:  Subacute cough with wheeze 6 weeks of cough reported (though lat note 3 weeks ago stated started a few days ago)  associated with wheeze. Improved on prednisone previously 6 day course but never completely resolved then worsened back to prior levels. History of pneumonia last year. PMH but not problem list mentions asthma and patient denies pulmonary history/asthma. No sinus pressure to suggest sinusitis. No other symptoms to suggest allergies though typically has fall allergies. On PPI already. Not on ace-i. We will get chest x-ray to rule out pneumonia. Sats down to 94% when 97% last visit. Biking fatigue also concerning This could be bronchitis as well and we discussed treating with 2 week taper prednisone and following up if no resolution after that course if chest x-ray negative.   Return precautions- if symptoms do not resolve with planned treatment (decided after CXR)  Orders Placed This Encounter  Procedures  . DG Chest 2 View    Standing Status: Future     Number of Occurrences:      Standing Expiration Date: 07/29/2016    Order Specific Question:  Reason for Exam (SYMPTOM  OR DIAGNOSIS REQUIRED)    Answer:  cough 6 weeks, quit 27  years ago 7.5 pack years former smoker, improved but persisted with 6 days prednisone, some wheeze    Order Specific Question:  Preferred imaging location?    Answer:  [facility]    Addendum: CXR negative for PNA. 2 week prednisone taper called in. Discussed with patient's wife on DPR and patient to return to Dr. Elease Hashimoto if symptoms persist

## 2015-05-30 NOTE — Patient Instructions (Signed)
Unclear cause here  Rule out pneumonia with chest x-ray  Given wheeze and continued cough, could be a bronchitis which is usually viral. If chest x-ray negative, likely trial longer prednisone course and follow up with Dr. Elease Hashimoto if does not resolve.   Regardless, since albuterol helps, I would advise every 6 hours use for 24 hours at least

## 2015-07-29 ENCOUNTER — Other Ambulatory Visit (INDEPENDENT_AMBULATORY_CARE_PROVIDER_SITE_OTHER): Payer: Medicare Other

## 2015-07-29 DIAGNOSIS — R7989 Other specified abnormal findings of blood chemistry: Secondary | ICD-10-CM

## 2015-07-29 LAB — T4, FREE: Free T4: 0.65 ng/dL (ref 0.60–1.60)

## 2015-07-29 LAB — TSH: TSH: 2.27 u[IU]/mL (ref 0.35–4.50)

## 2015-08-21 ENCOUNTER — Telehealth: Payer: Self-pay | Admitting: Family Medicine

## 2015-08-21 MED ORDER — OMEPRAZOLE 40 MG PO CPDR: 40.0000 mg | DELAYED_RELEASE_CAPSULE | Freq: Every day | ORAL | Status: DC

## 2015-08-21 MED ORDER — PRAVASTATIN SODIUM 40 MG PO TABS: 40.0000 mg | ORAL_TABLET | Freq: Every day | ORAL | Status: DC

## 2015-08-21 NOTE — Telephone Encounter (Signed)
Pt request refill of the following: pravastatin (PRAVACHOL) 40 MG tablet ,  omeprazole (PRILOSEC) 40 MG capsule   Pt req 90 day supply    Phamacy:  [facility] [address]

## 2015-08-21 NOTE — Telephone Encounter (Signed)
RX sent to pharmacy  

## 2015-09-29 ENCOUNTER — Other Ambulatory Visit: Payer: Self-pay | Admitting: *Deleted

## 2015-09-29 MED ORDER — LOSARTAN POTASSIUM 50 MG PO TABS: 50.0000 mg | ORAL_TABLET | Freq: Every day | ORAL | Status: DC

## 2015-09-29 MED ORDER — OMEPRAZOLE 40 MG PO CPDR: 40.0000 mg | DELAYED_RELEASE_CAPSULE | Freq: Every day | ORAL | Status: DC

## 2015-12-25 ENCOUNTER — Ambulatory Visit: Payer: BC Managed Care – PPO | Admitting: Podiatry

## 2016-01-07 ENCOUNTER — Telehealth: Payer: Self-pay | Admitting: Family Medicine

## 2016-01-07 DIAGNOSIS — M79671 Pain in right foot: Secondary | ICD-10-CM

## 2016-01-07 NOTE — Telephone Encounter (Signed)
Pt wife is aware that order is entered.

## 2016-01-07 NOTE — Telephone Encounter (Signed)
Pt had an appt with dr sheard and md does not accept [employer] ins. Pt would like to see dr Inocencio Homes phone [phone] . Pt is having right foot pain. Pt has [employer] thn ins. Can we refer?

## 2016-01-07 NOTE — Telephone Encounter (Signed)
Pt was last seen on 05/30/2015 acute visit only. Pending on 04/06/2016 for CPE. Okay for referral or needs to be seen to evaluate?

## 2016-01-07 NOTE — Telephone Encounter (Signed)
OK to refer.

## 2016-02-06 DIAGNOSIS — M79672 Pain in left foot: Secondary | ICD-10-CM | POA: Diagnosis not present

## 2016-02-06 DIAGNOSIS — G5761 Lesion of plantar nerve, right lower limb: Secondary | ICD-10-CM | POA: Diagnosis not present

## 2016-02-06 DIAGNOSIS — G5762 Lesion of plantar nerve, left lower limb: Secondary | ICD-10-CM | POA: Diagnosis not present

## 2016-02-06 DIAGNOSIS — M79671 Pain in right foot: Secondary | ICD-10-CM | POA: Diagnosis not present

## 2016-02-06 DIAGNOSIS — M722 Plantar fascial fibromatosis: Secondary | ICD-10-CM | POA: Diagnosis not present

## 2016-02-27 ENCOUNTER — Other Ambulatory Visit (INDEPENDENT_AMBULATORY_CARE_PROVIDER_SITE_OTHER): Payer: Commercial Managed Care - HMO

## 2016-02-27 DIAGNOSIS — Z125 Encounter for screening for malignant neoplasm of prostate: Secondary | ICD-10-CM

## 2016-02-27 DIAGNOSIS — Z0000 Encounter for general adult medical examination without abnormal findings: Secondary | ICD-10-CM

## 2016-02-27 LAB — LIPID PANEL
CHOL/HDL RATIO: 3
Cholesterol: 206 mg/dL — ABNORMAL HIGH (ref 0–200)
HDL: 62.2 mg/dL (ref >39.00)
LDL Cholesterol: 107 mg/dL — ABNORMAL HIGH (ref 0–99)
NONHDL: 143.79
TRIGLYCERIDES: 185 mg/dL — AB (ref 0.0–149.0)
VLDL: 37 mg/dL (ref 0.0–40.0)

## 2016-02-27 LAB — BASIC METABOLIC PANEL
BUN: 14 mg/dL (ref 6–23)
CALCIUM: 9.2 mg/dL (ref 8.4–10.5)
CO2: 30 mEq/L (ref 19–32)
Chloride: 100 mEq/L (ref 96–112)
Creatinine, Ser: 0.88 mg/dL (ref 0.40–1.50)
GFR: 92.08 mL/min (ref >60.00)
Glucose, Bld: 108 mg/dL — ABNORMAL HIGH (ref 70–99)
POTASSIUM: 4.3 meq/L (ref 3.5–5.1)
SODIUM: 140 meq/L (ref 135–145)

## 2016-02-27 LAB — HEPATIC FUNCTION PANEL
ALBUMIN: 4.4 g/dL (ref 3.5–5.2)
ALK PHOS: 55 U/L (ref 39–117)
ALT: 23 U/L (ref 0–53)
AST: 29 U/L (ref 0–37)
BILIRUBIN DIRECT: 0.1 mg/dL (ref 0.0–0.3)
TOTAL PROTEIN: 7.2 g/dL (ref 6.0–8.3)
Total Bilirubin: 0.6 mg/dL (ref 0.2–1.2)

## 2016-02-27 LAB — CBC WITH DIFFERENTIAL/PLATELET
Basophils Absolute: 0 10*3/uL (ref 0.0–0.1)
Basophils Relative: 0.3 % (ref 0.0–3.0)
EOS ABS: 0.3 10*3/uL (ref 0.0–0.7)
Eosinophils Relative: 4.8 % (ref 0.0–5.0)
HEMATOCRIT: 41.6 % (ref 39.0–52.0)
HEMOGLOBIN: 14.1 g/dL (ref 13.0–17.0)
Lymphocytes Relative: 17.2 % (ref 12.0–46.0)
Lymphs Abs: 1 10*3/uL (ref 0.7–4.0)
MCHC: 33.9 g/dL (ref 30.0–36.0)
MCV: 92.5 fl (ref 78.0–100.0)
Monocytes Absolute: 0.6 10*3/uL (ref 0.1–1.0)
Monocytes Relative: 9.9 % (ref 3.0–12.0)
Neutro Abs: 4.1 10*3/uL (ref 1.4–7.7)
Neutrophils Relative %: 67.8 % (ref 43.0–77.0)
Platelets: 210 10*3/uL (ref 150.0–400.0)
RBC: 4.5 Mil/uL (ref 4.22–5.81)
RDW: 12.9 % (ref 11.5–15.5)
WBC: 6.1 10*3/uL (ref 4.0–10.5)

## 2016-02-27 LAB — TSH: TSH: 3.79 u[IU]/mL (ref 0.35–4.50)

## 2016-02-27 LAB — PSA: PSA: 3.8 ng/mL (ref 0.10–4.00)

## 2016-03-03 ENCOUNTER — Other Ambulatory Visit: Payer: Medicare HMO

## 2016-03-09 ENCOUNTER — Ambulatory Visit (INDEPENDENT_AMBULATORY_CARE_PROVIDER_SITE_OTHER): Payer: Commercial Managed Care - HMO | Admitting: Family Medicine

## 2016-03-09 ENCOUNTER — Encounter: Payer: Self-pay | Admitting: Family Medicine

## 2016-03-09 VITALS — BP 150/90 | HR 93 | Temp 97.9°F | Ht 69.0 in | Wt 180.5 lb

## 2016-03-09 DIAGNOSIS — K219 Gastro-esophageal reflux disease without esophagitis: Secondary | ICD-10-CM | POA: Diagnosis not present

## 2016-03-09 DIAGNOSIS — Z0000 Encounter for general adult medical examination without abnormal findings: Secondary | ICD-10-CM | POA: Diagnosis not present

## 2016-03-09 DIAGNOSIS — Z23 Encounter for immunization: Secondary | ICD-10-CM

## 2016-03-09 NOTE — Progress Notes (Signed)
Pre visit review using our clinic review tool, if applicable. No additional management support is needed unless otherwise documented below in the visit note. 

## 2016-03-09 NOTE — Patient Instructions (Signed)
Monitor blood pressure and be in touch if consistently > 150/90.   I will set up referral to Dr Earlean Shawl.

## 2016-03-09 NOTE — Progress Notes (Signed)
Subjective:    Patient ID: Daniel Sullivan, male    DOB: [birth year], 65 y.o.   MRN: [id number]  HPI Patient seen for physical exam and separate issue of worsening GERD symptoms as below. He is very active physically and cycles several days per week sometimes up to 25 or 30 miles per episode of exercise. He has history of hypertension treated with losartan. Past history of prediabetes. He has dyslipidemia. History of GERD and takes omeprazole 40 mg daily.  Needs Pneumovax , otherwise immunizations up-to-date. Colonoscopy up-to-date. Nonsmoker. No regular alcohol use.  Patient relates some progressive GERD symptoms over the past 4-5 weeks. He takes daily omeprazole and a supplement with Pepcid but has still had almost daily burning epigastric and esophageal region sub-sternally. Never related to exercise. No dysphagia. He's had good appetite and no reported weight loss. No stool changes. Never had any associated exertional chest pains.  Past Medical History  Diagnosis Date  . Asthma   . GERD (gastroesophageal reflux disease)   . Hyperlipidemia   . Hypertension   . Allergy   . Diverticulosis    No past surgical history on file.  reports that he quit smoking about 28 years ago. His smoking use included Cigarettes. He has a 7.5 pack-year smoking history. He has never used smokeless tobacco. He reports that he drinks alcohol. He reports that he does not use illicit drugs. family history includes Alcohol abuse in his father; Cancer (age of onset: 48) in his brother. No Known Allergies    Review of Systems  Constitutional: Negative for fever, activity change, appetite change and fatigue.  HENT: Negative for congestion, ear pain and trouble swallowing.   Eyes: Negative for pain and visual disturbance.  Respiratory: Negative for cough, shortness of breath and wheezing.   Cardiovascular: Negative for chest pain and palpitations.  Gastrointestinal: Negative for nausea, vomiting, abdominal pain,  diarrhea, constipation, blood in stool, abdominal distention and rectal pain.  Endocrine: Negative for polydipsia and polyuria.  Genitourinary: Negative for dysuria, hematuria and testicular pain.  Musculoskeletal: Negative for joint swelling and arthralgias.  Skin: Negative for rash.  Neurological: Negative for dizziness, syncope and headaches.  Hematological: Negative for adenopathy.  Psychiatric/Behavioral: Negative for confusion and dysphoric mood.       Objective:   Physical Exam  Constitutional: He is oriented to person, place, and time. He appears well-developed and well-nourished. No distress.  HENT:  Head: Normocephalic and atraumatic.  Right Ear: External ear normal.  Left Ear: External ear normal.  Mouth/Throat: Oropharynx is clear and moist.  Eyes: Conjunctivae and EOM are normal. Pupils are equal, round, and reactive to light.  Neck: Normal range of motion. Neck supple. No thyromegaly present.  Cardiovascular: Normal rate, regular rhythm and normal heart sounds.   No murmur heard. Pulmonary/Chest: No respiratory distress. He has no wheezes. He has no rales.  Abdominal: Soft. Bowel sounds are normal. He exhibits no distension and no mass. There is no tenderness. There is no rebound and no guarding.  Musculoskeletal: He exhibits no edema.  Lymphadenopathy:    He has no cervical adenopathy.  Neurological: He is alert and oriented to person, place, and time. He displays normal reflexes. No cranial nerve deficit.  Skin: No rash noted.  Psychiatric: He has a normal mood and affect.          Assessment & Plan:   #1 physical exam. Pneumovax given. Continue healthy lifestyle habits with regular exercise. Colonoscopy up-to-date   #2 hypertension. Borderline elevation  with repeat reading of 150/90. He is reluctant to add additional medication or increased dose at this time. We recommend regular monitoring blood pressure at home and be in touch if consistently greater than  150/90.   #3 GERD. Patient is having almost daily breakthrough symptoms over the past month even with daily omeprazole and Pepcid. Set up GI referral. May need EGD for further evaluation. He does not have any red flags such as weight loss or dysphagia.

## 2016-03-17 ENCOUNTER — Telehealth: Payer: Self-pay

## 2016-03-17 NOTE — Telephone Encounter (Signed)
Rec'd from [facility] Medical forward 20 pages to GI Historical Provider

## 2016-03-18 ENCOUNTER — Encounter: Payer: Self-pay | Admitting: Internal Medicine

## 2016-03-20 ENCOUNTER — Other Ambulatory Visit: Payer: Self-pay | Admitting: Family Medicine

## 2016-03-31 IMAGING — CR DG CHEST 2V
2 series · 2 of 2 positions shown · non-contrast
Comparison: February 05, 2010

CLINICAL DATA: Cough and difficulty breathing

EXAM:
CHEST  2 VIEW

[view not recorded (1 of 2)]
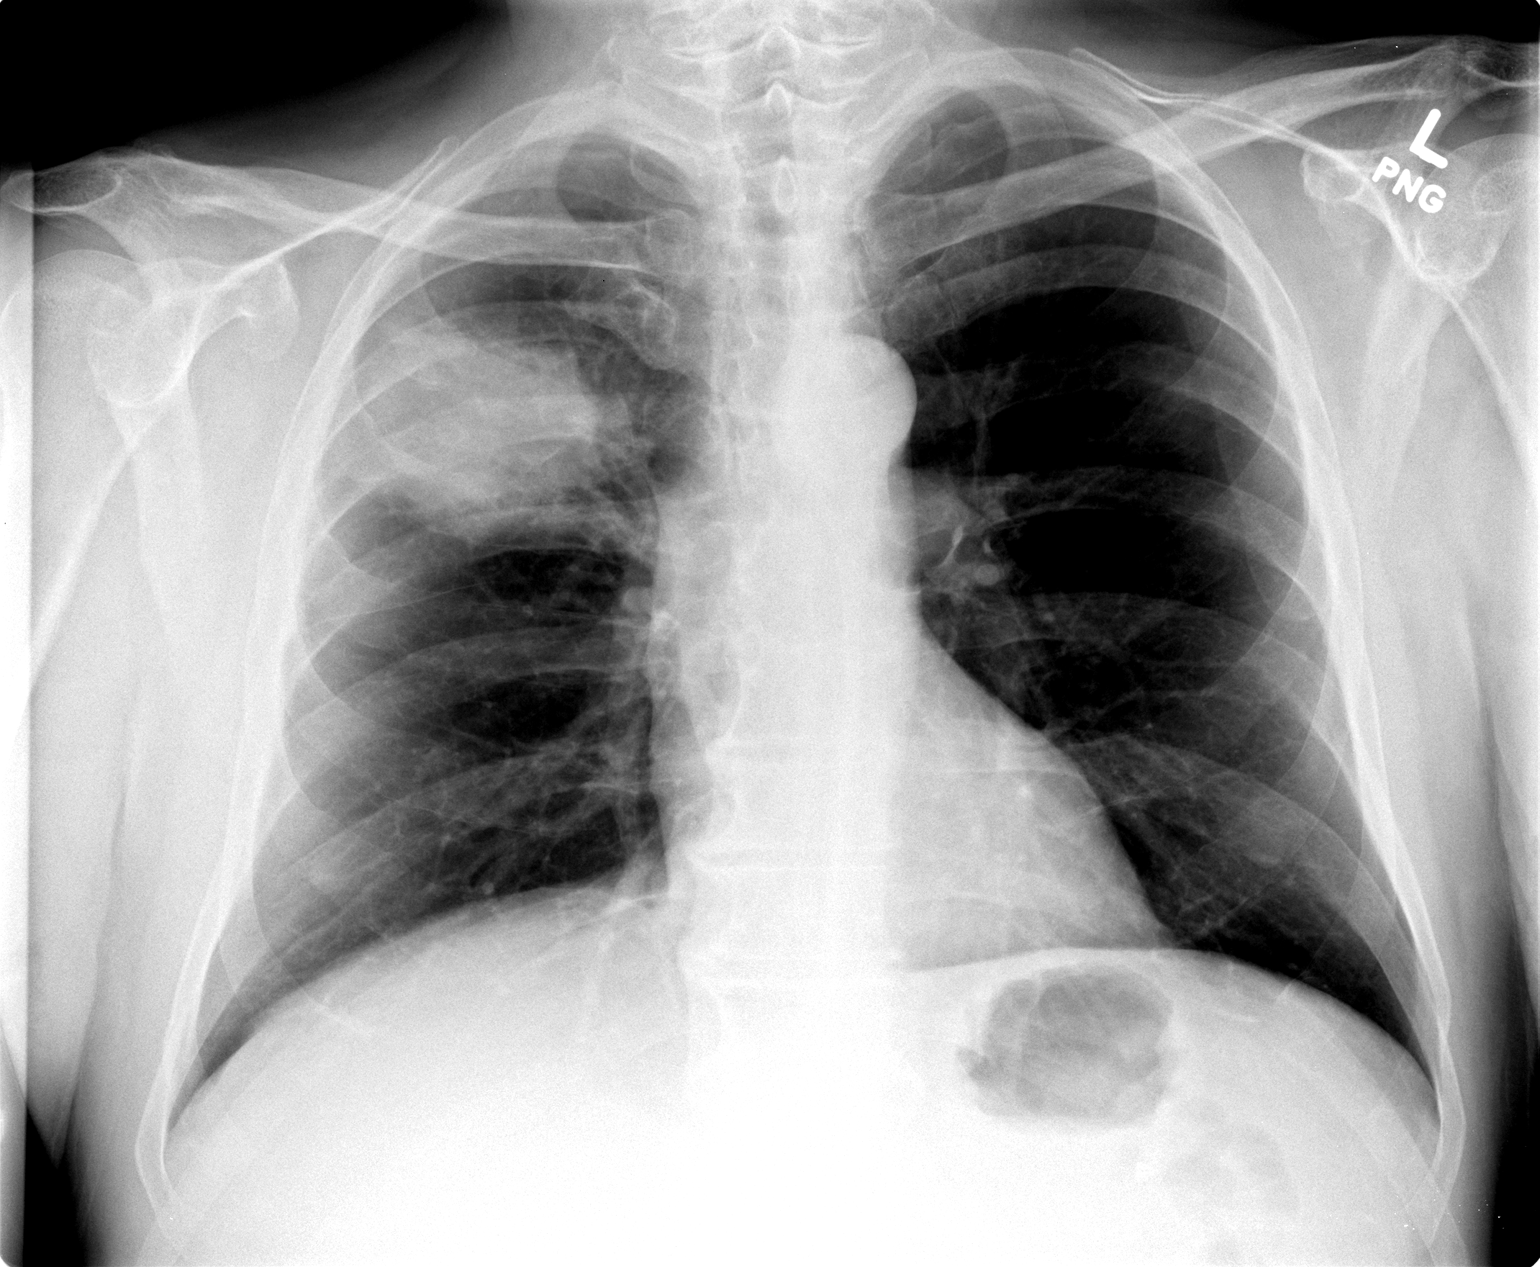

[view not recorded (2 of 2)]
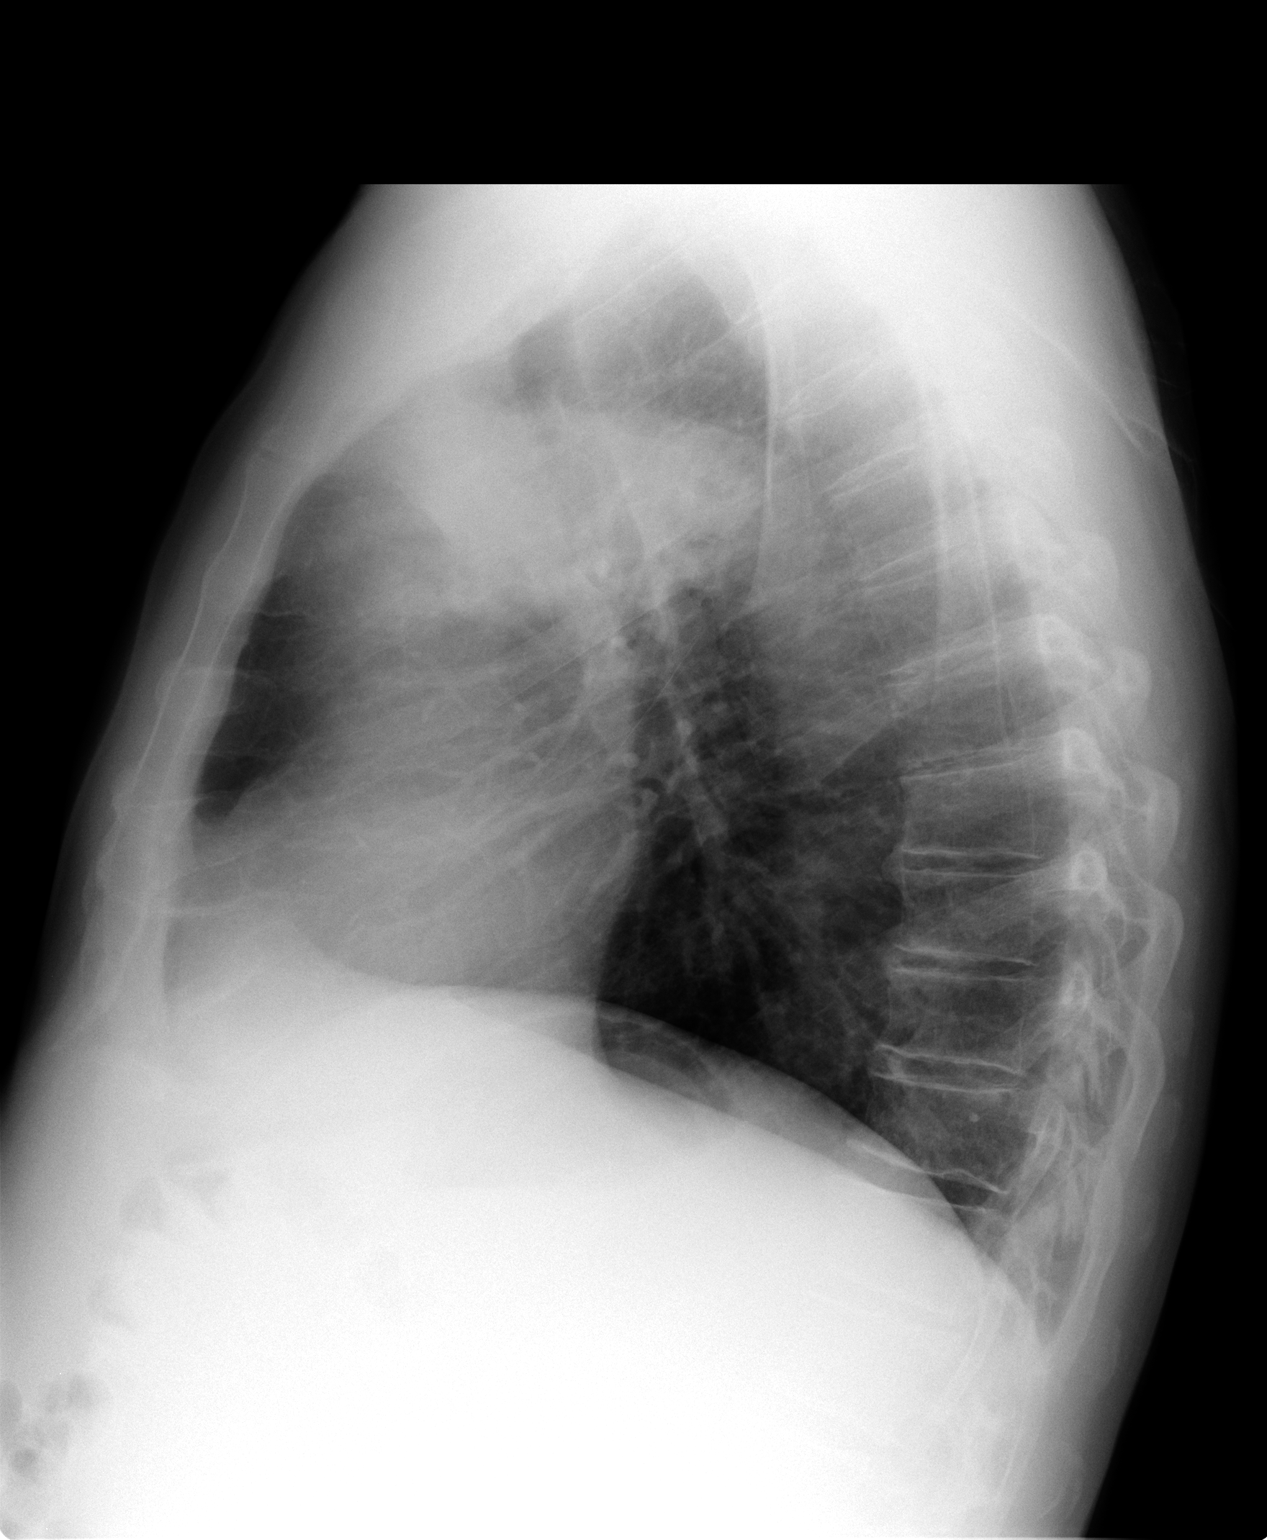

[2 of 2 positions shown; findings below may reference images not displayed]

FINDINGS: There is extensive consolidation in the anterior segment of the
right upper lobe. Elsewhere lungs are clear. Heart size and
pulmonary vascularity are normal. No adenopathy. Nipple shadows are
noted bilaterally. There are no appreciable bone lesions.
IMPRESSION: Extensive consolidation in the anterior segment of the right upper
lobe. This finding most likely is due to pneumonia. It must be
cautioned that an underlying mass lesion could easily be obscured in
this area of consolidation. In this regard, followups images to
clearing advised. If this area persists after approximately 10-14
days without significant clearance, chest CT, ideally with
intravenous contrast, would be advised to further assess. Elsewhere
lungs are clear.

These results will be called to the ordering clinician or
representative by the Radiologist Assistant, and communication
documented in the PACS or zVision Dashboard.

## 2016-04-06 ENCOUNTER — Encounter: Payer: Medicare Other | Admitting: Family Medicine

## 2016-05-20 ENCOUNTER — Ambulatory Visit: Payer: Commercial Managed Care - HMO | Admitting: Internal Medicine

## 2016-06-11 DIAGNOSIS — G5762 Lesion of plantar nerve, left lower limb: Secondary | ICD-10-CM | POA: Diagnosis not present

## 2016-06-11 DIAGNOSIS — G5761 Lesion of plantar nerve, right lower limb: Secondary | ICD-10-CM | POA: Diagnosis not present

## 2016-06-23 ENCOUNTER — Other Ambulatory Visit: Payer: Self-pay | Admitting: Family Medicine

## 2016-06-30 ENCOUNTER — Encounter: Payer: Self-pay | Admitting: Internal Medicine

## 2016-06-30 ENCOUNTER — Encounter (INDEPENDENT_AMBULATORY_CARE_PROVIDER_SITE_OTHER): Payer: Self-pay

## 2016-06-30 ENCOUNTER — Ambulatory Visit (INDEPENDENT_AMBULATORY_CARE_PROVIDER_SITE_OTHER): Payer: Commercial Managed Care - HMO | Admitting: Internal Medicine

## 2016-06-30 VITALS — BP 130/90 | HR 89 | Ht 69.0 in | Wt 175.0 lb

## 2016-06-30 DIAGNOSIS — Z86010 Personal history of colonic polyps: Secondary | ICD-10-CM

## 2016-06-30 DIAGNOSIS — K219 Gastro-esophageal reflux disease without esophagitis: Secondary | ICD-10-CM | POA: Diagnosis not present

## 2016-06-30 NOTE — Progress Notes (Signed)
Referred by:  Daniel Post, MD [place], [place] [id number]  Subjective:    Patient ID: Daniel Sullivan, male    DOB: [birth year], 65 y.o.   MRN: [id number]  CC: abdominal pain  HPI [name] is retired 65 y.o. male presenting with abdominal pain, occuring 3 months ago. Had 5 weeks of epigastric pain and increased heartburn while on a PPI. He has taken PPIs for the past 10 years and expresses concerns about its side effect and is wanting to discontinue the medication. He does not associate dysphagia, vomiting, regurgitation, hematochezia, or changes in bowels.  Symptoms have all resolved and are worsened if he stops his PPI.   No Known Allergies  Past Medical History:  Diagnosis Date  . Allergy   . Asthma   . Diverticulosis   . Elevated PSA   . GERD (gastroesophageal reflux disease)   . Hemorrhoids   . Hx of adenomatous colonic polyps   . Hyperlipidemia   . Hypertension   . Hypogonadism in male    Past Surgical History:  Procedure Laterality Date  . COLONOSCOPY W/ BIOPSIES    . HEMORRHOID BANDING     Social History   Social History  . Marital status: Married    Spouse name: N/A  . Number of children: N/A  . Years of education: N/A   Occupational History  . retired    Social History Main Topics  . Smoking status: Former Smoker    Packs/day: 0.50    Years: 15.00    Types: Cigarettes    Quit date: 02/24/1988  . Smokeless tobacco: Never Used  . Alcohol use Yes     Comment: occasionally  . Drug use: No  . Sexual activity: Yes    Partners: Female   Other Topics Concern  . None   Social History Narrative  . None   Family History  Problem Relation Age of Onset  . Alcohol abuse Father   . Suicidality Father   . Cancer Brother 33    melanoma    Review of Systems See HPI, all other systems negative    Objective:   Physical Exam  @BP  130/90   Pulse 89   Ht 5\' 9"  (1.753 m)   Wt 175 lb (79.4 kg)   BMI 25.84 kg/m @  General:   NAD Eyes:   anicteric Lungs:  clear Heart::  S1S2 no rubs, murmurs or gallops Abdomen:  soft and nontender, BS+ Ext:   no edema, cyanosis or clubbing     Data Reviewed:  Colonoscopy from 08/13/14 EGD from 12/19/03      Assessment & Plan:   Encounter Diagnoses  Name Primary?  . Gastroesophageal reflux disease without esophagitis Yes  . Hx of adenomatous polyp of colon    GERD: symptoms are consistent with GERD and are worsened when PPIs are held. Do not need EGD as symptoms have resolved and has had an EGD in 2005. Discussed with patient it is reasonable to stop PPI and see how he does. Educated on tapering PPI dose to prevent rebound acid.   Hx of polyps: Given his history of polyps on his colonoscopy on 08/13/14 we will have a recall colonoscopy in 2020.   Grayland Ormond PA-S   I have seen the patient with Mr. Venetia Maxon and he has served as a Education administrator.  I appreciate the opportunity to care for this patient.  CC: Daniel Post, MD [address] [place], [place] [address]

## 2016-06-30 NOTE — Progress Notes (Deleted)
   Subjective:    Patient ID: Daniel Sullivan, male    DOB: 1951/11/23, 65 y.o.   MRN: [id number]  HPI    Review of Systems     Objective:   Physical Exam        Assessment & Plan:

## 2016-06-30 NOTE — Patient Instructions (Signed)
   Try and taper off your omeprazole as discussed.  Purchase omeprazole 20mg  tablets over the counter to alternate using with your 40mg 's.    If you need a lower dose PPI prescribed let us know.    We are putting you in for a colon recall 08/2019.    Today we are giving you a handout on GERD to read and follow.     I appreciate the opportunity to care for you. Silvano Rusk, MD, [facility]

## 2016-07-01 ENCOUNTER — Encounter: Payer: Self-pay | Admitting: Internal Medicine

## 2016-07-06 DIAGNOSIS — Z85828 Personal history of other malignant neoplasm of skin: Secondary | ICD-10-CM | POA: Diagnosis not present

## 2016-07-06 DIAGNOSIS — L821 Other seborrheic keratosis: Secondary | ICD-10-CM | POA: Diagnosis not present

## 2016-07-06 DIAGNOSIS — D1801 Hemangioma of skin and subcutaneous tissue: Secondary | ICD-10-CM | POA: Diagnosis not present

## 2016-07-06 DIAGNOSIS — D235 Other benign neoplasm of skin of trunk: Secondary | ICD-10-CM | POA: Diagnosis not present

## 2016-07-06 DIAGNOSIS — L570 Actinic keratosis: Secondary | ICD-10-CM | POA: Diagnosis not present

## 2016-07-06 DIAGNOSIS — L814 Other melanin hyperpigmentation: Secondary | ICD-10-CM | POA: Diagnosis not present

## 2016-07-07 ENCOUNTER — Ambulatory Visit (INDEPENDENT_AMBULATORY_CARE_PROVIDER_SITE_OTHER): Payer: Commercial Managed Care - HMO | Admitting: Family Medicine

## 2016-07-07 ENCOUNTER — Encounter: Payer: Self-pay | Admitting: Family Medicine

## 2016-07-07 VITALS — BP 160/100 | HR 82 | Temp 98.4°F | Ht 69.0 in | Wt 176.0 lb

## 2016-07-07 DIAGNOSIS — R0789 Other chest pain: Secondary | ICD-10-CM

## 2016-07-07 DIAGNOSIS — I10 Essential (primary) hypertension: Secondary | ICD-10-CM

## 2016-07-07 NOTE — Progress Notes (Signed)
Subjective:     Patient ID: Daniel Sullivan, male   DOB: Jun 30, 1951, 65 y.o.   MRN: [id number]  HPI Patient seen with some nonspecific symptoms of fatigue and atypical chest pains. He is concerned this may be related to recent heat illness. He states that 11 days ago on Saturday he went for 21 mile bike ride and by 10:30 AM temperature was around 90. When he got back he felt very fatigued and somewhat lightheaded. After getting cooled down and drinking fluids he felt somewhat better. He denies having any chest pain during that ride.  That next week on Thursday again went for a bike ride and felt somewhat poorly during the ride and had no chest pain during the ride. No dyspnea with exertion. He has recalled during the past couple of weeks some very fleeting sharp very transient right chest wall pain which he describes as "achy" quality.  He also played golf on Friday. Sunday and Monday over the past week and had some issues with fatigue  Denies any recent fevers or chills. No dyspnea. No chest pains have been recent atypical right-sided pain as above.  Has hypertension treated with losartan and generally well controlled by home readings.  Past Medical History:  Diagnosis Date  . Allergy   . Asthma   . Diverticulosis   . Elevated PSA   . GERD (gastroesophageal reflux disease)   . Hemorrhoids   . Hx of adenomatous colonic polyps   . Hyperlipidemia   . Hypertension   . Hypogonadism in male    Past Surgical History:  Procedure Laterality Date  . COLONOSCOPY W/ BIOPSIES    . HEMORRHOID BANDING      reports that he quit smoking about 28 years ago. His smoking use included Cigarettes. He has a 7.50 pack-year smoking history. He has never used smokeless tobacco. He reports that he drinks alcohol. He reports that he does not use drugs. family history includes Alcohol abuse in his father; Cancer (age of onset: 26) in his brother; Suicidality in his father. No Known Allergies   Review of  Systems  Constitutional: Positive for fatigue. Negative for chills and fever.  Eyes: Negative for visual disturbance.  Cardiovascular: Positive for chest pain (see history of present illness). Negative for palpitations and leg swelling.  Gastrointestinal: Negative for abdominal pain, nausea and vomiting.  Genitourinary: Negative for dysuria.  Neurological: Positive for light-headedness. Negative for syncope.  Hematological: Negative for adenopathy. Does not bruise/bleed easily.       Objective:   Physical Exam  Constitutional: He is oriented to person, place, and time. He appears well-developed and well-nourished.  Neck: Neck supple. No thyromegaly present.  Cardiovascular: Normal rate and regular rhythm.  Exam reveals no gallop and no friction rub.   Pulmonary/Chest: Effort normal and breath sounds normal. No respiratory distress. He has no wheezes. He has no rales.  Abdominal: Soft. There is no tenderness.  Musculoskeletal: He exhibits no edema.  Lymphadenopathy:    He has no cervical adenopathy.  Neurological: He is alert and oriented to person, place, and time.       Assessment:     #1 recent atypical right-sided chest pain. Denies any recent exertional dyspnea or pain  #2 hypertension which has generally been well controlled but elevated today here in the office  #3 recent nonspecific lightheadedness and fatigue possibly related to heat exhaustion    Plan:     -EKG shows sinus rhythm with some nonspecific T-wave abnormalities inferior leads.  No significant change compared to prior tracing 2013 -Avoid exercising during extreme heat in the middle of day -Monitor blood pressure very closely at home to be in touch if consistently greater than 150/90  Eulas Post MD [place] [facility] at [facility]

## 2016-07-07 NOTE — Progress Notes (Signed)
Pre visit review using our clinic review tool, if applicable. No additional management support is needed unless otherwise documented below in the visit note. 

## 2016-07-07 NOTE — Patient Instructions (Signed)
Stay well hydrated Avoid being in sun middle of day Be conscious of electrolyte replacement as discussed. Follow up for any exertional chest pain or exertional shortness of breath.

## 2016-07-18 ENCOUNTER — Other Ambulatory Visit: Payer: Self-pay | Admitting: Family Medicine

## 2016-07-27 ENCOUNTER — Ambulatory Visit (INDEPENDENT_AMBULATORY_CARE_PROVIDER_SITE_OTHER): Payer: Commercial Managed Care - HMO | Admitting: Family Medicine

## 2016-07-27 VITALS — BP 150/94 | HR 105 | Temp 97.4°F | Ht 69.0 in | Wt 174.2 lb

## 2016-07-27 DIAGNOSIS — I10 Essential (primary) hypertension: Secondary | ICD-10-CM

## 2016-07-27 MED ORDER — AMLODIPINE BESYLATE 5 MG PO TABS: 5.0000 mg | ORAL_TABLET | Freq: Every day | ORAL | 11 refills | Status: DC

## 2016-07-27 NOTE — Progress Notes (Signed)
Pre visit review using our clinic review tool, if applicable. No additional management support is needed unless otherwise documented below in the visit note. 

## 2016-07-27 NOTE — Progress Notes (Signed)
Subjective:     Patient ID: Daniel Sullivan, male   DOB: 1951-05-06, 65 y.o.   MRN: [id number]  HPI Patient seen for follow-up hypertension. He currently takes losartan 50 mg daily. He had several home readings that have been ranging between [id number] systolic to up to 99991111 systolic and [id number] to 0000000 diastolic. He exercises regularly with cycling most days of the week. He has noted that after exercise his blood pressure tends to be well controlled but tends to rebound later in the day. He's not had any headaches. No chest pains. No peripheral edema issues. He is compliant with losartan therapy. Occasional alcohol but not usually to excess  Past Medical History:  Diagnosis Date  . Allergy   . Asthma   . Diverticulosis   . Elevated PSA   . GERD (gastroesophageal reflux disease)   . Hemorrhoids   . Hx of adenomatous colonic polyps   . Hyperlipidemia   . Hypertension   . Hypogonadism in male    Past Surgical History:  Procedure Laterality Date  . COLONOSCOPY W/ BIOPSIES    . HEMORRHOID BANDING      reports that he quit smoking about 28 years ago. His smoking use included Cigarettes. He has a 7.50 pack-year smoking history. He has never used smokeless tobacco. He reports that he drinks alcohol. He reports that he does not use drugs. family history includes Alcohol abuse in his father; Cancer (age of onset: 15) in his brother; Suicidality in his father. No Known Allergies   Review of Systems  Constitutional: Negative for fatigue.  Eyes: Negative for visual disturbance.  Respiratory: Negative for cough, chest tightness and shortness of breath.   Cardiovascular: Negative for chest pain, palpitations and leg swelling.  Neurological: Negative for dizziness, syncope, weakness, light-headedness and headaches.       Objective:   Physical Exam  Constitutional: He is oriented to person, place, and time. He appears well-developed and well-nourished.  HENT:  Right Ear: External ear normal.  Left Ear:  External ear normal.  Mouth/Throat: Oropharynx is clear and moist.  Eyes: Pupils are equal, round, and reactive to light.  Neck: Neck supple. No thyromegaly present.  Cardiovascular: Normal rate and regular rhythm.   Pulmonary/Chest: Effort normal and breath sounds normal. No respiratory distress. He has no wheezes. He has no rales.  Musculoskeletal: He exhibits no edema.  Neurological: He is alert and oriented to person, place, and time.       Assessment:     Hypertension. Poorly controlled    Plan:     Add amlodipine 5 mg daily Continue losartan 50 mg daily Continue regular exercise habits Routine follow-up within 2 months to reassess Discussed alcohol in moderation  Eulas Post MD [place] [facility]

## 2016-08-29 ENCOUNTER — Other Ambulatory Visit: Payer: Self-pay | Admitting: Family Medicine

## 2016-09-19 ENCOUNTER — Other Ambulatory Visit: Payer: Self-pay | Admitting: Family Medicine

## 2016-10-04 ENCOUNTER — Other Ambulatory Visit: Payer: Self-pay

## 2016-10-04 MED ORDER — FLUTICASONE PROPIONATE 50 MCG/ACT NA SUSP: 2.0000 | Freq: Every day | NASAL | 1 refills | Status: DC

## 2016-10-06 ENCOUNTER — Other Ambulatory Visit: Payer: Self-pay

## 2016-10-06 MED ORDER — FLUTICASONE PROPIONATE 50 MCG/ACT NA SUSP: 2.0000 | Freq: Every day | NASAL | 0 refills | Status: DC

## 2016-11-16 ENCOUNTER — Other Ambulatory Visit: Payer: Self-pay | Admitting: Family Medicine

## 2016-12-02 ENCOUNTER — Other Ambulatory Visit: Payer: Self-pay | Admitting: Family Medicine

## 2016-12-02 NOTE — Telephone Encounter (Signed)
Pt would like a refill. Last refill was 08/29/2016. Last seen 03/09/2016 for gastroesophageal reflux diease. Please advise.

## 2016-12-02 NOTE — Telephone Encounter (Signed)
Refill for 6 months. 

## 2016-12-11 ENCOUNTER — Other Ambulatory Visit: Payer: Self-pay | Admitting: Family Medicine

## 2016-12-25 ENCOUNTER — Other Ambulatory Visit: Payer: Self-pay | Admitting: Family Medicine

## 2016-12-31 ENCOUNTER — Encounter: Payer: Self-pay | Admitting: Internal Medicine

## 2017-01-03 ENCOUNTER — Telehealth: Payer: Self-pay

## 2017-01-03 NOTE — Telephone Encounter (Signed)
Left message for patient to call back  

## 2017-01-03 NOTE — Telephone Encounter (Signed)
-----   Message from Gatha Mayer, MD sent at 12/31/2016  3:28 PM EST ----- Regarding: needs appt Communication via My Chart Hx hemorrhoid banding elsewhere Still ghas rectal pain  I rec: Recticare and office evaluation  Ok to use a banding slot or squeeze him in in [place] but do not think I would band him

## 2017-01-04 NOTE — Telephone Encounter (Signed)
Patient notified of recommendations. He declines appt. He will think about it and call back if he changes his mind

## 2017-02-12 ENCOUNTER — Other Ambulatory Visit: Payer: Self-pay | Admitting: Family Medicine

## 2017-03-27 ENCOUNTER — Other Ambulatory Visit: Payer: Self-pay | Admitting: Family Medicine

## 2017-04-03 ENCOUNTER — Other Ambulatory Visit: Payer: Self-pay | Admitting: Family Medicine

## 2017-04-07 ENCOUNTER — Encounter: Payer: Self-pay | Admitting: Family Medicine

## 2017-04-08 ENCOUNTER — Other Ambulatory Visit: Payer: Self-pay | Admitting: *Deleted

## 2017-04-08 MED ORDER — OMEPRAZOLE 20 MG PO CPDR: 20.0000 mg | DELAYED_RELEASE_CAPSULE | Freq: Every day | ORAL | 5 refills | Status: DC

## 2017-04-08 NOTE — Telephone Encounter (Signed)
Rx done and pt notified via Mychart message. 

## 2017-05-23 ENCOUNTER — Encounter: Payer: Self-pay | Admitting: Family Medicine

## 2017-05-23 DIAGNOSIS — K219 Gastro-esophageal reflux disease without esophagitis: Secondary | ICD-10-CM

## 2017-05-31 ENCOUNTER — Other Ambulatory Visit: Payer: Self-pay | Admitting: Dermatology

## 2017-05-31 ENCOUNTER — Encounter: Payer: Self-pay | Admitting: Internal Medicine

## 2017-05-31 ENCOUNTER — Encounter: Payer: Self-pay | Admitting: Family Medicine

## 2017-05-31 DIAGNOSIS — Z85828 Personal history of other malignant neoplasm of skin: Secondary | ICD-10-CM | POA: Diagnosis not present

## 2017-05-31 DIAGNOSIS — L814 Other melanin hyperpigmentation: Secondary | ICD-10-CM | POA: Diagnosis not present

## 2017-05-31 DIAGNOSIS — L570 Actinic keratosis: Secondary | ICD-10-CM | POA: Diagnosis not present

## 2017-05-31 DIAGNOSIS — D225 Melanocytic nevi of trunk: Secondary | ICD-10-CM | POA: Diagnosis not present

## 2017-05-31 DIAGNOSIS — C44722 Squamous cell carcinoma of skin of right lower limb, including hip: Secondary | ICD-10-CM | POA: Diagnosis not present

## 2017-05-31 DIAGNOSIS — L821 Other seborrheic keratosis: Secondary | ICD-10-CM | POA: Diagnosis not present

## 2017-06-01 ENCOUNTER — Ambulatory Visit: Payer: Commercial Managed Care - HMO | Admitting: Family Medicine

## 2017-06-07 ENCOUNTER — Encounter: Payer: Self-pay | Admitting: Family Medicine

## 2017-06-07 ENCOUNTER — Ambulatory Visit (INDEPENDENT_AMBULATORY_CARE_PROVIDER_SITE_OTHER): Payer: Medicare HMO | Admitting: Family Medicine

## 2017-06-07 VITALS — BP 130/90 | HR 63 | Ht 69.5 in | Wt 175.2 lb

## 2017-06-07 DIAGNOSIS — Z125 Encounter for screening for malignant neoplasm of prostate: Secondary | ICD-10-CM

## 2017-06-07 DIAGNOSIS — Z0000 Encounter for general adult medical examination without abnormal findings: Secondary | ICD-10-CM | POA: Diagnosis not present

## 2017-06-07 LAB — BASIC METABOLIC PANEL
BUN: 13 mg/dL (ref 6–23)
CHLORIDE: 100 meq/L (ref 96–112)
CO2: 34 meq/L — AB (ref 19–32)
CREATININE: 1.03 mg/dL (ref 0.40–1.50)
Calcium: 9.4 mg/dL (ref 8.4–10.5)
GFR: 76.49 mL/min (ref >60.00)
Glucose, Bld: 121 mg/dL — ABNORMAL HIGH (ref 70–99)
Potassium: 4.3 mEq/L (ref 3.5–5.1)
Sodium: 140 mEq/L (ref 135–145)

## 2017-06-07 LAB — HEPATIC FUNCTION PANEL
ALT: 21 U/L (ref 0–53)
AST: 27 U/L (ref 0–37)
Albumin: 4.6 g/dL (ref 3.5–5.2)
Alkaline Phosphatase: 53 U/L (ref 39–117)
BILIRUBIN DIRECT: 0.1 mg/dL (ref 0.0–0.3)
BILIRUBIN TOTAL: 0.5 mg/dL (ref 0.2–1.2)
Total Protein: 7.5 g/dL (ref 6.0–8.3)

## 2017-06-07 LAB — CBC WITH DIFFERENTIAL/PLATELET
BASOS ABS: 0 10*3/uL (ref 0.0–0.1)
Basophils Relative: 0.4 % (ref 0.0–3.0)
EOS ABS: 0.3 10*3/uL (ref 0.0–0.7)
Eosinophils Relative: 5.3 % — ABNORMAL HIGH (ref 0.0–5.0)
HEMATOCRIT: 44 % (ref 39.0–52.0)
Hemoglobin: 14.8 g/dL (ref 13.0–17.0)
LYMPHS PCT: 15.9 % (ref 12.0–46.0)
Lymphs Abs: 1 10*3/uL (ref 0.7–4.0)
MCHC: 33.6 g/dL (ref 30.0–36.0)
MCV: 94.8 fl (ref 78.0–100.0)
MONO ABS: 0.6 10*3/uL (ref 0.1–1.0)
Monocytes Relative: 9.5 % (ref 3.0–12.0)
NEUTROS ABS: 4.3 10*3/uL (ref 1.4–7.7)
Neutrophils Relative %: 68.9 % (ref 43.0–77.0)
PLATELETS: 204 10*3/uL (ref 150.0–400.0)
RBC: 4.64 Mil/uL (ref 4.22–5.81)
RDW: 12.9 % (ref 11.5–15.5)
WBC: 6.3 10*3/uL (ref 4.0–10.5)

## 2017-06-07 LAB — LIPID PANEL
CHOL/HDL RATIO: 3
Cholesterol: 208 mg/dL — ABNORMAL HIGH (ref 0–200)
HDL: 62.5 mg/dL (ref >39.00)
LDL CALC: 122 mg/dL — AB (ref 0–99)
NonHDL: 145.83
TRIGLYCERIDES: 118 mg/dL (ref 0.0–149.0)
VLDL: 23.6 mg/dL (ref 0.0–40.0)

## 2017-06-07 LAB — TSH: TSH: 4.49 u[IU]/mL (ref 0.35–4.50)

## 2017-06-07 LAB — PSA: PSA: 5.52 ng/mL — ABNORMAL HIGH (ref 0.10–4.00)

## 2017-06-07 NOTE — Progress Notes (Signed)
Subjective:     Patient ID: Daniel Sullivan, male   DOB: 30-Aug-1951, 66 y.o.   MRN: [id number]  HPI Patient seen for physical exam. He has history of GERD which is controlled with low-dose omeprazole. Other medical problems include history of hypertension and dyslipidemia.  He stays very active physically. He recalls a day last week when he rode 23 miles on his bicycle and worked outside a considerable amount later that day. He had episode of what he termed "heat exhaustion ". He had fatigue, headaches, chills. He's had some fatigue since then. He feels he is hydrating well.  He's had some chronic loose stools and is scheduled see gastroenterologist soon. Colonoscopy is up-to-date. No known history of lactose intolerance or gluten intolerance.  Past Medical History:  Diagnosis Date  . Allergy   . Asthma   . Diverticulosis   . Elevated PSA   . GERD (gastroesophageal reflux disease)   . Hemorrhoids   . Hx of adenomatous colonic polyps   . Hyperlipidemia   . Hypertension   . Hypogonadism in male    Past Surgical History:  Procedure Laterality Date  . COLONOSCOPY W/ BIOPSIES    . HEMORRHOID BANDING      reports that he quit smoking about 29 years ago. His smoking use included Cigarettes. He has a 7.50 pack-year smoking history. He has never used smokeless tobacco. He reports that he drinks alcohol. He reports that he does not use drugs. family history includes Alcohol abuse in his father; Cancer (age of onset: 71) in his brother; Suicidality in his father. No Known Allergies   Review of Systems  Constitutional: Positive for fatigue. Negative for activity change, appetite change, fever and unexpected weight change.  HENT: Negative for congestion, ear pain and trouble swallowing.   Eyes: Negative for pain and visual disturbance.  Respiratory: Negative for cough, shortness of breath and wheezing.   Cardiovascular: Negative for chest pain and palpitations.  Gastrointestinal: Negative for  abdominal distention, abdominal pain, blood in stool, constipation, nausea, rectal pain and vomiting.  Endocrine: Negative for polydipsia and polyuria.  Genitourinary: Negative for dysuria, hematuria and testicular pain.  Musculoskeletal: Negative for arthralgias and joint swelling.  Skin: Negative for rash.  Neurological: Negative for dizziness, syncope and headaches.  Hematological: Negative for adenopathy.  Psychiatric/Behavioral: Negative for confusion and dysphoric mood.       Objective:   Physical Exam  Constitutional: He is oriented to person, place, and time. He appears well-developed and well-nourished. No distress.  HENT:  Head: Normocephalic and atraumatic.  Right Ear: External ear normal.  Left Ear: External ear normal.  Mouth/Throat: Oropharynx is clear and moist.  Eyes: Conjunctivae and EOM are normal. Pupils are equal, round, and reactive to light.  Neck: Normal range of motion. Neck supple. No thyromegaly present.  Cardiovascular: Normal rate, regular rhythm and normal heart sounds.   No murmur heard. Pulmonary/Chest: No respiratory distress. He has no wheezes. He has no rales.  Abdominal: Soft. Bowel sounds are normal. He exhibits no distension and no mass. There is no tenderness. There is no rebound and no guarding.  Genitourinary: Rectum normal and prostate normal.  Musculoskeletal: He exhibits no edema.  Lymphadenopathy:    He has no cervical adenopathy.  Neurological: He is alert and oriented to person, place, and time. He displays normal reflexes. No cranial nerve deficit.  Skin: No rash noted.  Eschar right anterior leg from recent skin biopsy per dermatology. No signs of secondary infection  Psychiatric:  He has a normal mood and affect.       Assessment:     Physical exam. Immunizations up-to-date. Colonoscopy up-to-date    Plan:     -Obtain screening lab work -The natural history of prostate cancer and ongoing controversy regarding screening and  potential treatment outcomes of prostate cancer has been discussed with the patient. The meaning of a false positive PSA and a false negative PSA has been discussed. He indicates understanding of the limitations of this screening test and wishes to proceed with screening PSA testing. -Discussed heat related illness and prevention and are also early recognition of signs and symptoms of heat exhaustion  Eulas Post MD [place] [facility] at [facility]

## 2017-06-09 ENCOUNTER — Other Ambulatory Visit: Payer: Self-pay | Admitting: Family Medicine

## 2017-06-09 DIAGNOSIS — R972 Elevated prostate specific antigen [PSA]: Secondary | ICD-10-CM

## 2017-06-14 ENCOUNTER — Encounter: Payer: Self-pay | Admitting: Internal Medicine

## 2017-06-24 ENCOUNTER — Other Ambulatory Visit: Payer: Self-pay | Admitting: Family Medicine

## 2017-07-07 ENCOUNTER — Other Ambulatory Visit: Payer: Self-pay | Admitting: Family Medicine

## 2017-07-12 DIAGNOSIS — Z85828 Personal history of other malignant neoplasm of skin: Secondary | ICD-10-CM | POA: Diagnosis not present

## 2017-07-12 DIAGNOSIS — L905 Scar conditions and fibrosis of skin: Secondary | ICD-10-CM | POA: Diagnosis not present

## 2017-07-19 ENCOUNTER — Ambulatory Visit: Payer: Commercial Managed Care - HMO | Admitting: Internal Medicine

## 2017-08-01 ENCOUNTER — Encounter: Payer: Self-pay | Admitting: Gastroenterology

## 2017-08-09 ENCOUNTER — Ambulatory Visit: Payer: Medicare HMO | Admitting: Internal Medicine

## 2017-08-18 ENCOUNTER — Encounter: Payer: Self-pay | Admitting: Internal Medicine

## 2017-08-18 ENCOUNTER — Ambulatory Visit (INDEPENDENT_AMBULATORY_CARE_PROVIDER_SITE_OTHER): Payer: Medicare HMO | Admitting: Internal Medicine

## 2017-08-18 VITALS — BP 118/76 | HR 60 | Ht 69.0 in | Wt 174.0 lb

## 2017-08-18 DIAGNOSIS — K580 Irritable bowel syndrome with diarrhea: Secondary | ICD-10-CM

## 2017-08-18 DIAGNOSIS — K219 Gastro-esophageal reflux disease without esophagitis: Secondary | ICD-10-CM | POA: Diagnosis not present

## 2017-08-18 DIAGNOSIS — R152 Fecal urgency: Secondary | ICD-10-CM | POA: Diagnosis not present

## 2017-08-18 DIAGNOSIS — M6289 Other specified disorders of muscle: Secondary | ICD-10-CM | POA: Diagnosis not present

## 2017-08-18 MED ORDER — RIFAXIMIN 550 MG PO TABS: 550.0000 mg | ORAL_TABLET | Freq: Three times a day (TID) | ORAL | 0 refills | Status: DC

## 2017-08-18 NOTE — Progress Notes (Signed)
Daniel Sullivan 66 y.o. [birth year] [id number]  Assessment & Plan:   Encounter Diagnoses  Name Primary?  . Irritable bowel syndrome with diarrhea Yes  . Fecal urgency   . Pelvic floor dysfunction   . Gastroesophageal reflux disease, esophagitis presence not specified      Review FODMAPS don't start Xifaxan 550 tid x 14 days for IBS D Anorectal manometry Nov f/u  I appreciate the opportunity to care for this patient. [id number], Alinda Sierras, MD  Subjective:   Chief Complaint:Irregular bowel habits reflux HPI   The patient has an approximate 10 year history where he has several bowel movements in a day, 2 or 3 in the morning that are progressively softer and even later in the day though be softer and much year and is some difficulty cleansing. He's had a colonoscopy in 05 and 15, he has a history of a small adenoma and 15 some hemorrhoids which have been banded but he still has some slight bleeding at times. Bowel habit is as follows: Bowels 2-3 stools in AM Feels like he needs to go a lot - incomplete defecation Progressively looser stools  Golf 3-4x/wk and has to move bowels when he gets to the golf course in the morning There is fecal urgency also   Reflux is under good control at 20 mg daily he reduced that in 2017 when I saw him then. He had tried to get off it but his been unable to. He is comfortable taking the medication. 2 cups of coffee a day. Not smoking. No Known Allergies Current Meds  Medication Sig  . amLODipine (NORVASC) 5 MG tablet TAKE 1 TABLET (5 MG TOTAL) BY MOUTH DAILY.  . fluticasone (FLONASE) 50 MCG/ACT nasal spray USE 2 SPRAYS INTO BOTH NOSTRILS DAILY.  Marland Kitchen Glucosamine-Chondroit-Vit C-Mn TABS Take by mouth daily.   Marland Kitchen losartan (COZAAR) 50 MG tablet TAKE 1 TABLET (50 MG TOTAL) BY MOUTH DAILY.  . Multiple Vitamins-Minerals (MENS MULTI VITAMIN & MINERAL PO) Take by mouth daily.    Marland Kitchen omeprazole (PRILOSEC) 20 MG capsule Take 1 capsule (20 mg total) by mouth  daily.  . pravastatin (PRAVACHOL) 40 MG tablet TAKE 1 TABLET BY MOUTH DAILY.  . vitamin C (ASCORBIC ACID) 500 MG tablet Take 500 mg by mouth daily.   Past Medical History:  Diagnosis Date  . Allergy   . Asthma   . Diverticulosis   . Elevated PSA   . GERD (gastroesophageal reflux disease)   . Hemorrhoids   . Hx of adenomatous colonic polyps   . Hyperlipidemia   . Hypertension   . Hypogonadism in male    Past Surgical History:  Procedure Laterality Date  . COLONOSCOPY W/ BIOPSIES    . HEMORRHOID BANDING     Social History   Social History  . Marital status: Married    Spouse name: N/A  . Number of children: N/A  . Years of education: N/A   Occupational History  . retired    Social History Main Topics  . Smoking status: Former Smoker    Packs/day: 0.50    Years: 15.00    Types: Cigarettes    Quit date: 02/24/1988  . Smokeless tobacco: Never Used  . Alcohol use Yes     Comment: occasionally  . Drug use: No  . Sexual activity: Yes    Partners: Female   Other Topics Concern  . Not on file   Social History Narrative  . No narrative on file   family  history includes Alcohol abuse in his father; Melanoma (age of onset: 6) in his brother; Suicidality in his father.   Review of Systems   Objective:   Physical Exam BP 118/76   Pulse 60   Ht 5\' 9"  (1.753 m)   Wt 174 lb (78.9 kg)   BMI 25.70 kg/m   Thin well-developed well-nourished white man in no acute distress Eyes are anicteric Rectal, normal anoderm though there is a slight amount of brown feces around the anus. Negative anal wink. There is increased resting tone. There is reduced voluntary squeeze. Prostate is normal there is soft brown stool. There is no mass. It's nontender. Simulated defecation shows appropriate abdominal contraction but some paradoxical contraction of the anus and rectum and decreased descent

## 2017-08-18 NOTE — Patient Instructions (Addendum)
   Today we are giving you a FODMAP to read and follow.   We have sent the following medications to your pharmacy for you to pick up at your convenience: Xifaxan, this may need a prior authorization which we do thru [facility] in [place].  They will contact you about shipment.  Patient aware that [facility] is working on this due to his insurance needing a PA.  You have been scheduled to have an anorectal manometry at [facility] on 09/07/17 at 8:30AM. Please arrive 30 minutes prior to your appointment time for registration (1st floor of the hospital-admissions).  Please make certain to use 1 Fleets enema 2 hours prior to coming for your appointment. You can purchase Fleets enemas from the laxative section at your drug store. You should not eat anything during the two hours prior to the procedure. You may take regular medications with small sips of water at least 2 hours prior to the study.  Anorectal manometry is a test performed to evaluate patients with constipation or fecal incontinence. This test measures the pressures of the anal sphincter muscles, the sensation in the rectum, and the neural reflexes that are needed for normal bowel movements.  THE PROCEDURE The test takes approximately 30 minutes to 1 hour. You will be asked to change into a hospital gown. A technician or nurse will explain the procedure to you, take a brief health history, and answer any questions you may have. The patient then lies on his or her left side. A small, flexible tube, about the size of a thermometer, with a balloon at the end is inserted into the rectum. The catheter is connected to a machine that measures the pressure. During the test, the small balloon attached to the catheter may be inflated in the rectum to assess the normal reflex pathways. The nurse or technician may also ask the person to squeeze, relax, and push at various times. The anal sphincter muscle pressures are measured during each of  these maneuvers. To squeeze, the patient tightens the sphincter muscles as if trying to prevent anything from coming out. To push or bear down, the patient strains down as if trying to have a bowel movement.   Please follow up with [place] in November.   I appreciate the opportunity to care for you. Silvano Rusk, MD, [facility]

## 2017-08-22 ENCOUNTER — Telehealth: Payer: Self-pay | Admitting: Family Medicine

## 2017-08-22 DIAGNOSIS — E291 Testicular hypofunction: Secondary | ICD-10-CM

## 2017-08-22 NOTE — Telephone Encounter (Signed)
Lab order placed.  Please schedule.  Thanks.

## 2017-08-22 NOTE — Telephone Encounter (Signed)
Yes- as long as he gets labs before 9 AM

## 2017-08-22 NOTE — Telephone Encounter (Signed)
Pt would like to add a testosterone lab to the PSA he has scheduled already.  Is that ok?

## 2017-08-24 ENCOUNTER — Telehealth: Payer: Self-pay

## 2017-08-24 NOTE — Telephone Encounter (Signed)
Received Prior Auth form from [place] for [place]. Form completed and faxed back to [facility] along with last clinical note.

## 2017-08-25 ENCOUNTER — Encounter: Payer: Self-pay | Admitting: Family Medicine

## 2017-08-25 ENCOUNTER — Encounter: Payer: Self-pay | Admitting: Internal Medicine

## 2017-08-25 NOTE — Telephone Encounter (Signed)
Received fax from [facility] stating Xifaxan 550 mg tablets were approved through 11/22/17.

## 2017-08-25 NOTE — Telephone Encounter (Signed)
Pt has been scheduled.  °

## 2017-09-05 ENCOUNTER — Telehealth: Payer: Self-pay | Admitting: Internal Medicine

## 2017-09-05 NOTE — Telephone Encounter (Signed)
Left message for patient to call back  

## 2017-09-05 NOTE — Telephone Encounter (Signed)
All questions answered

## 2017-09-06 ENCOUNTER — Ambulatory Visit (INDEPENDENT_AMBULATORY_CARE_PROVIDER_SITE_OTHER): Payer: Medicare HMO | Admitting: Family Medicine

## 2017-09-06 ENCOUNTER — Other Ambulatory Visit: Payer: Self-pay

## 2017-09-06 ENCOUNTER — Encounter: Payer: Self-pay | Admitting: Family Medicine

## 2017-09-06 VITALS — BP 138/82 | HR 85 | Temp 98.3°F | Wt 172.5 lb

## 2017-09-06 DIAGNOSIS — I10 Essential (primary) hypertension: Secondary | ICD-10-CM | POA: Diagnosis not present

## 2017-09-06 DIAGNOSIS — S298XXA Other specified injuries of thorax, initial encounter: Secondary | ICD-10-CM

## 2017-09-06 DIAGNOSIS — Z23 Encounter for immunization: Secondary | ICD-10-CM

## 2017-09-06 DIAGNOSIS — J069 Acute upper respiratory infection, unspecified: Secondary | ICD-10-CM

## 2017-09-06 DIAGNOSIS — R972 Elevated prostate specific antigen [PSA]: Secondary | ICD-10-CM | POA: Diagnosis not present

## 2017-09-06 DIAGNOSIS — S20211A Contusion of right front wall of thorax, initial encounter: Secondary | ICD-10-CM | POA: Diagnosis not present

## 2017-09-06 DIAGNOSIS — E291 Testicular hypofunction: Secondary | ICD-10-CM

## 2017-09-06 DIAGNOSIS — R42 Dizziness and giddiness: Secondary | ICD-10-CM

## 2017-09-06 LAB — PSA: PSA: 5.37 ng/mL — AB (ref 0.10–4.00)

## 2017-09-06 LAB — TESTOSTERONE: Testosterone: 247.17 ng/dL — ABNORMAL LOW (ref 300.00–890.00)

## 2017-09-06 NOTE — Patient Instructions (Signed)
Monitor blood pressure and be in touch if consistently > 130/80 

## 2017-09-06 NOTE — Progress Notes (Signed)
Subjective:     Patient ID: Daniel Sullivan, male   DOB: 06/30/1951, 66 y.o.   MRN: [id number]  HPI Patient seen for several issues as follows  He had some dizziness which was very transient about a week ago. He had couple episodes where he squatted down or bent over and when standing up noticed some sensation of disequilibrium. Was not sure if this represented true vertigo. No syncope. Symptoms were very transient lasting only a few seconds. He had no exercise intolerance whatsoever. He did a 55 mile bike ride last weekend without difficulty. No symptoms today.  Hypertension treated with amlodipine and losartan. Compliant with therapy. Initial blood pressure elevated here today but he had recent blood pressure GI office 118/76.  URI type symptoms. Onset last week. He had some mild sore throat and nonproductive cough with mild nasal congestion. No fever.  Possible bruised right rib. September 13 he was choking on piece of meat but apparently a friend of his performed the Heimlich maneuver his had some soreness since then. Minimal pain with deep breathing. No hemoptysis. Coping fairly well without medication.  Elevated PSA of over 5 by lab several months ago. Here for repeat today. Had previous negative biopsy several years ago. No obstructive urinary symptoms  Past Medical History:  Diagnosis Date  . Allergy   . Asthma   . Diverticulosis   . Elevated PSA   . GERD (gastroesophageal reflux disease)   . Hemorrhoids   . Hx of adenomatous colonic polyps   . Hyperlipidemia   . Hypertension   . Hypogonadism in male    Past Surgical History:  Procedure Laterality Date  . COLONOSCOPY W/ BIOPSIES    . HEMORRHOID BANDING    . HEMORRHOID BANDING      reports that he quit smoking about 29 years ago. His smoking use included Cigarettes. He has a 7.50 pack-year smoking history. He has never used smokeless tobacco. He reports that he drinks alcohol. He reports that he does not use drugs. family  history includes Alcohol abuse in his father; Melanoma (age of onset: 48) in his brother; Suicidality in his father. No Known Allergies   Review of Systems  Constitutional: Negative for chills, fatigue and fever.  HENT: Positive for congestion and sore throat.   Eyes: Negative for visual disturbance.  Respiratory: Positive for cough. Negative for chest tightness and shortness of breath.   Cardiovascular: Negative for chest pain, palpitations and leg swelling.  Neurological: Positive for dizziness. Negative for syncope, speech difficulty, weakness, light-headedness and headaches.  Hematological: Negative for adenopathy. Does not bruise/bleed easily.  Psychiatric/Behavioral: Negative for confusion.       Objective:   Physical Exam  Constitutional: He is oriented to person, place, and time. He appears well-developed and well-nourished.  HENT:  Right Ear: External ear normal.  Left Ear: External ear normal.  Mouth/Throat: Oropharynx is clear and moist.  Eyes: Pupils are equal, round, and reactive to light.  Neck: Neck supple. No thyromegaly present.  Cardiovascular: Normal rate and regular rhythm.   Pulmonary/Chest: Effort normal and breath sounds normal. No respiratory distress. He has no wheezes. He has no rales.  Mildly tender to palpation right anterior rib cage around 7th or 8th rib  Musculoskeletal: He exhibits no edema.  Neurological: He is alert and oriented to person, place, and time. No cranial nerve deficit. Coordination normal.  Normal gait. Normal finger to nose.       Assessment:     #1 elevated PSA  by recent labs. Previous negative prostate biopsy  #2 transient dizziness. Questionable transient vertigo by history. Symptoms not reproducible today. No orthostatic change. Seated blood pressure 42/82 and standing 138/82  #3 hypertension with slightly elevated reading here today but fairly consistently well controlled by home readings  #4 viral URI  #5 probable right  anterior rib contusion. Could have small fracture    Plan:     -Reassurance regarding URI symptoms and right anterior rib pain. We discussed pros and cons of x-ray but would not change therapy. -Continue to monitor blood pressure and be in touch if consistently greater than 130/80 -Recheck PSA today -Follow-up for any recurrent or progressive dizziness or any new symptoms  Eulas Post MD [facility]

## 2017-09-07 ENCOUNTER — Ambulatory Visit (HOSPITAL_COMMUNITY)
Admission: RE | Admit: 2017-09-07 | Discharge: 2017-09-07 | Disposition: A | Discharge status: Home / Self Care | Payer: Medicare HMO | Source: Ambulatory Visit | Attending: Gastroenterology | Admitting: Gastroenterology

## 2017-09-07 ENCOUNTER — Encounter (HOSPITAL_COMMUNITY)
Admission: RE | Disposition: A | Discharge status: Home / Self Care | Payer: Self-pay | Source: Ambulatory Visit | Attending: Gastroenterology

## 2017-09-07 DIAGNOSIS — R152 Fecal urgency: Secondary | ICD-10-CM | POA: Diagnosis not present

## 2017-09-07 DIAGNOSIS — R159 Full incontinence of feces: Secondary | ICD-10-CM | POA: Diagnosis not present

## 2017-09-07 DIAGNOSIS — K5902 Outlet dysfunction constipation: Secondary | ICD-10-CM | POA: Diagnosis not present

## 2017-09-07 DIAGNOSIS — R933 Abnormal findings on diagnostic imaging of other parts of digestive tract: Secondary | ICD-10-CM | POA: Insufficient documentation

## 2017-09-07 HISTORY — PX: ANAL RECTAL MANOMETRY: SHX6358

## 2017-09-07 SURGERY — MANOMETRY, ANORECTAL

## 2017-09-07 NOTE — Progress Notes (Signed)
Anorectal manometry done per protocol.  Pt. Tolerated procedure well, w/o complications.  Dr. Silverio Decamp to be notified today.  Laverta Baltimore, RN

## 2017-09-08 ENCOUNTER — Encounter (HOSPITAL_COMMUNITY): Payer: Self-pay | Admitting: Gastroenterology

## 2017-09-09 DIAGNOSIS — R152 Fecal urgency: Secondary | ICD-10-CM

## 2017-09-09 DIAGNOSIS — R159 Full incontinence of feces: Secondary | ICD-10-CM

## 2017-09-13 ENCOUNTER — Other Ambulatory Visit: Payer: Self-pay

## 2017-09-22 ENCOUNTER — Telehealth: Payer: Self-pay | Admitting: Family Medicine

## 2017-09-22 NOTE — Telephone Encounter (Signed)
Daniel Sullivan pt returned your call and stated that he saw his lab results on mychart and at the time that someone was calling him he was driving and was unable to answer the.  If you still need to contact him you may call him back but on his voice mail box it states his middle name "Worth" and not his first name "[name]".  He will also be sending a mychart msg to Dr. Elease Hashimoto but he says he is doing well and is okay.

## 2017-09-23 ENCOUNTER — Other Ambulatory Visit: Payer: Self-pay | Admitting: Family Medicine

## 2017-09-23 DIAGNOSIS — R972 Elevated prostate specific antigen [PSA]: Secondary | ICD-10-CM

## 2017-09-23 NOTE — Telephone Encounter (Signed)
Patient is aware of lab results and lab order placed.  Patient will call back to schedule a lab appointment.

## 2017-10-02 ENCOUNTER — Other Ambulatory Visit: Payer: Self-pay | Admitting: Family Medicine

## 2017-10-25 ENCOUNTER — Encounter: Payer: Self-pay | Admitting: Internal Medicine

## 2017-10-25 ENCOUNTER — Ambulatory Visit: Payer: Medicare HMO | Admitting: Internal Medicine

## 2017-10-25 VITALS — BP 140/100 | HR 96 | Ht 69.0 in | Wt 175.6 lb

## 2017-10-25 DIAGNOSIS — R198 Other specified symptoms and signs involving the digestive system and abdomen: Secondary | ICD-10-CM | POA: Diagnosis not present

## 2017-10-25 DIAGNOSIS — K648 Other hemorrhoids: Secondary | ICD-10-CM | POA: Diagnosis not present

## 2017-10-25 NOTE — Progress Notes (Signed)
   Daniel Sullivan 66 y.o. 06-27-1951 [id number]  Assessment & Plan:   Encounter Diagnoses  Name Primary?  . Dysynergic defecation Yes  . Hemorrhoids with complication      He will be referred for pelvic floor physical therapy. Return visit with me in late January or perhaps February. Can consider reassessment hemorrhoids symptoms and possible banding.  He was banded by Dr. met off in the past.  I think his defecation issues should be under control prior to any banding of hemorrhoids. Subjective:   Chief Complaint: Irritable bowel syndrome and hemorrhoids  HPI The patient returns having multiple 3-5 bowel movements daily, he also says that his hemorrhoids swell and cause change in the stool.He has the results  He had an anorectal manometry in October, demonstrated dyssynergic defecation, he had a paradoxical contraction of the anal sphincter during defecation when it should have relaxed everything else looks normal in balloon expulsion test was not performed. No Known Allergies Current Meds  Medication Sig  . amLODipine (NORVASC) 5 MG tablet TAKE 1 TABLET (5 MG TOTAL) BY MOUTH DAILY.  . fluticasone (FLONASE) 50 MCG/ACT nasal spray USE 2 SPRAYS INTO BOTH NOSTRILS DAILY.  Marland Kitchen Glucosamine-Chondroit-Vit C-Mn TABS Take by mouth daily.   Marland Kitchen losartan (COZAAR) 50 MG tablet TAKE 1 TABLET (50 MG TOTAL) BY MOUTH DAILY.  . Multiple Vitamins-Minerals (MENS MULTI VITAMIN & MINERAL PO) Take by mouth daily.    Marland Kitchen omeprazole (PRILOSEC) 20 MG capsule TAKE 1 CAPSULE BY MOUTH EVERY DAY  . pravastatin (PRAVACHOL) 40 MG tablet TAKE 1 TABLET BY MOUTH DAILY.  . vitamin C (ASCORBIC ACID) 500 MG tablet Take 500 mg by mouth daily.   Past Medical History:  Diagnosis Date  . Allergy   . Asthma   . Diverticulosis   . Elevated PSA   . GERD (gastroesophageal reflux disease)   . Hemorrhoids   . Hx of adenomatous colonic polyps   . Hyperlipidemia   . Hypertension   . Hypogonadism in male    Past Surgical  History:  Procedure Laterality Date  . ANO RECTAL MANOMETRY N/A 09/07/2017   Performed by Mauri Pole, MD at [place]  . COLONOSCOPY W/ BIOPSIES    . HEMORRHOID BANDING    . HEMORRHOID BANDING      Review of Systems As per HPI  Objective:   Physical Exam BP (!) 140/100   Pulse 96   Ht '5\' 9"'$  (1.753 m)   Wt 175 lb 9.6 oz (79.7 kg)   BMI 25.93 kg/m  No acute distress

## 2017-10-25 NOTE — Patient Instructions (Addendum)
  We are going to get you set up for physical therapy for pelvic floor dysfunction. They contact you with the appointment date/time.    I appreciate the opportunity to care for you. Silvano Rusk, MD, [facility]

## 2017-11-01 ENCOUNTER — Ambulatory Visit: Payer: Medicare HMO | Attending: Family Medicine | Admitting: Physical Therapy

## 2017-11-01 DIAGNOSIS — R252 Cramp and spasm: Secondary | ICD-10-CM | POA: Insufficient documentation

## 2017-11-01 DIAGNOSIS — M6281 Muscle weakness (generalized): Secondary | ICD-10-CM | POA: Diagnosis not present

## 2017-11-01 DIAGNOSIS — R279 Unspecified lack of coordination: Secondary | ICD-10-CM | POA: Insufficient documentation

## 2017-11-01 NOTE — Therapy (Signed)
[facility] [address] [place], [place], [address] Phone: [phone]   Fax:  [phone]  Physical Therapy Evaluation  Patient Details  Name: Daniel Sullivan MRN: [id number] Date of Birth: 09-20-1951 Referring Provider: Gatha Mayer, MD   Encounter Date: 11/01/2017  PT End of Session - 11/01/17 1212    Visit Number  1    Number of Visits  10    Date for PT Re-Evaluation  12/27/17    Authorization Type  medicare gcodes at visit 10    PT Start Time  1020    PT Stop Time  1100    PT Time Calculation (min)  40 min    Activity Tolerance  Patient tolerated treatment well    Behavior During Therapy  [facility] for tasks assessed/performed       Past Medical History:  Diagnosis Date  . Allergy   . Asthma   . Diverticulosis   . Elevated PSA   . GERD (gastroesophageal reflux disease)   . Hemorrhoids   . Hx of adenomatous colonic polyps   . Hyperlipidemia   . Hypertension   . Hypogonadism in male     Past Surgical History:  Procedure Laterality Date  . ANAL RECTAL MANOMETRY N/A 09/07/2017   Procedure: ANO RECTAL MANOMETRY;  Surgeon: Mauri Pole, MD;  Location: [facility];  Service: Endoscopy;  Laterality: N/A;  . COLONOSCOPY W/ BIOPSIES    . HEMORRHOID BANDING    . HEMORRHOID BANDING      There were no vitals filed for this visit.   Subjective Assessment - 11/01/17 1028    Subjective  Pt reports having bowel movements 4-5x/day mostly in the first 1-2 hour/day.  I have had this issue for 10 years but has progressively gotten worse.  I used to take immodium years ago when I traveled, but don't do that any more.  Based on the [facility] stool chart he indicates stool starts at 3 in the morning and becomes 5-6 at the end of the day.  Pt states he is able to go for 15 minute car rides but has to go when he gets there.      Pertinent History  hx of hemorrhoid banding, goes by "Worth"    Limitations  Walking;Other  (comment) golf and long car rides > 15 min    Diagnostic tests  anorectal manometry (dyssynergia)    Patient Stated Goals  have less BM, less anal irritation, avoid hemorrhoid banding surgery    Currently in Pain?  No/denies         [facility] PT Assessment - 11/01/17 0001      Assessment   Medical Diagnosis  R19.8 (ICD-10-CM) - Abnormal defecation    Referring Provider  Gatha Mayer, MD    Onset Date/Surgical Date  -- 10 years ago, noticed when traveling for work initially    Prior Therapy  no      Precautions   Precautions  None      Restrictions   Weight Bearing Restrictions  No      Balance Screen   Has the patient fallen in the past 6 months  No      [place] residence    Living Arrangements  Spouse/significant other      Prior Function   Level of Independence  Independent    Leisure  golfing and cycling      Cognition   Overall Cognitive  Status  Within Functional Limits for tasks assessed      Posture/Postural Control   Posture/Postural Control  No significant limitations      ROM / Strength   AROM / PROM / Strength  PROM;Strength      PROM   Overall PROM Comments  25% limited hip IR/ER bilateral      Strength   Overall Strength Comments  hip abduction 4-/5 bilaterally      Ambulation/Gait   Gait Pattern  Within Functional Limits             Objective measurements completed on examination: See above findings.    Pelvic Floor Special Questions - 11/01/17 0001    Prior Pelvic/Prostate Exam  Yes    Urinary Leakage  No    Urinary urgency  -- fecal urgency    Urinary frequency  fecal frequency 4-5x/day    Fecal incontinence  No    Fluid intake  3 glasses; 5 when riding my bike    Caffeine beverages  2 cups/day; occasionally soda    Falling out feeling (prolapse)  No    Skin Integrity  Hemorroids;Erthema;Irritaion present at    Skin Integrity Irritation Present at  anal opening    Prolapse  None    Pelvic  Floor Internal Exam  pt informed and consent given to perform internal assessment    Exam Type  Rectal    Sensation  normal    Palpation  tender hemorrhoid at anal sphincter and palpated internally as well, tender anal sphincter LA and coccygeus    Strength  weak squeeze, no lift    Strength # of reps  2    Strength # of seconds  3    Tone  high       [facility] Adult PT Treatment/Exercise - 11/01/17 0001      Self-Care   Self-Care  Other Self-Care Comments    Other Self-Care Comments   urge to void, fiber, bowel control techniques             PT Education - 11/01/17 1051    Education provided  Yes    Education Details  urge to void, fiber, bowel control    Person(s) Educated  Patient    Methods  Explanation;Handout    Comprehension  Verbalized understanding       PT Short Term Goals - 11/01/17 1100      PT SHORT TERM GOAL #1   Title  pt will be independent with initial HEP    Time  4    Period  Weeks    Status  New    Target Date  11/29/17      PT SHORT TERM GOAL #2   Title  pt will reduce number of BM to 3x/day    Time  4    Period  Weeks    Status  New    Target Date  11/29/17      PT SHORT TERM GOAL #3   Title  Pt will report 25% less anal pain or discomfort with BM    Time  4    Period  Weeks    Status  New    Target Date  11/29/17        PT Long Term Goals - 11/01/17 1102      PT LONG TERM GOAL #1   Title  pt will be independent with advanced HEP    Time  8    Period  Weeks  Status  New    Target Date  12/27/17      PT LONG TERM GOAL #2   Title  Pt will be able to go for 30 minute car ride without stopping to use the bathroom    Time  8    Period  Weeks    Status  New    Target Date  12/27/17      PT LONG TERM GOAL #3   Title  pt will be able to play golf without stopping to use the bathroom    Time  8    Period  Weeks    Status  New    Target Date  12/27/17      PT LONG TERM GOAL #4   Title  pt will report emptying bowels  completely due to improved muscle coordination and tone, demonstrated by no fecal matter coming out after performing toileting hygeine     Time  8    Period  Weeks    Status  New    Target Date  12/27/17      PT LONG TERM GOAL #5   Title  pt reports 50% less pain before, during, and after bowel movement    Time  8    Period  Weeks    Status  New    Target Date  12/27/17             Plan - 11/01/17 1204    Clinical Impression Statement  Patient presents to clinic due to dyssynergia of pelvic floor anorectal muscles.  Pt has increased tone and MMT of 2/5 of pelvic floor.  Decreased hip strength and decreased hip ROM.  Pt is unable to relax puborectalis and anal sphincter muscles when trying to push to have BM.  Pt is experiencing frequency of BM and unable to get everything out.  Pt has some pain and discomfort surrounding bowel movements.  He reports less solid stool throughout the day.  Pt demonstrates impairments mentioned above and will benefit from skilled PT to address these issues so he can avoid surgery and improve functional self care and recreational activities.    History and Personal Factors relevant to plan of care:  hemorrhoid banding    Clinical Presentation  Evolving    Clinical Presentation due to:  pt condition has worsened and hemorrhoid may need to have another surgery without intervention    Clinical Decision Making  Moderate    Rehab Potential  Good    PT Frequency  1x / week    PT Duration  8 weeks    PT Treatment/Interventions  ADLs/Self Care Home Management;Biofeedback;Cryotherapy;Moist Heat;Therapeutic activities;Therapeutic exercise;Patient/family education;Neuromuscular re-education;Passive range of motion;Dry needling;Taping;Electrical Stimulation;Iontophoresis '4mg'$ /ml Dexamethasone    PT Next Visit Plan  Anal distress PFDI, ab massage, toilet techniques, hip stretches, puborectalis MET, EMG biofeedback for relax/contract anal coordination    Consulted and  Agree with Plan of Care  Patient       Patient will benefit from skilled therapeutic intervention in order to improve the following deficits and impairments:  Pain, Decreased strength, Increased muscle spasms, Decreased coordination  Visit Diagnosis: Muscle weakness (generalized) - Plan: PT plan of care cert/re-cert  Cramp and spasm - Plan: PT plan of care cert/re-cert  Unspecified lack of coordination - Plan: PT plan of care cert/re-cert  G-Codes - 93/23/55 1430    Functional Assessment Tool Used (Outpatient Only)  clinical impression    Functional Limitation  Self care    Self Care  Current Status [phone])  At least 1 percent but less than 20 percent impaired, limited or restricted    Self Care Goal Status ([id number])  At least 1 percent but less than 20 percent impaired, limited or restricted        Problem List Patient Active Problem List   Diagnosis Date Noted  . Constipation due to outlet dysfunction   . Incontinence of feces with fecal urgency   . Anemia, iron deficiency 08/21/2014  . Prediabetes 03/26/2014  . Onychomycosis 12/28/2013  . Pain, foot 12/28/2013  . Metatarsalgia of both feet 06/22/2013  . Metatarsal deformity 06/22/2013  . ELEVATED PROSTATE SPECIFIC ANTIGEN 09/18/2010  . TESTICULAR HYPOFUNCTION 08/17/2010  . HYPERLIPIDEMIA 08/17/2010  . Essential hypertension 08/17/2010  . ALLERGIC RHINITIS 08/17/2010  . GERD 08/17/2010    Zannie Cove, PT 11/01/2017, 2:32 PM  [facility] [address], [place], [place], [address] Phone: [phone]   Fax:  [phone]  Name: IMARI SIVERTSEN MRN: [id number] Date of Birth: 1951/09/01

## 2017-11-01 NOTE — Patient Instructions (Addendum)
Bowel Control and Holding On When bowel control is decreased or lost it is helpful to understand some control techniques. Here are some tips for controlling your bowel movements.  1. Choose the best time of day to have a bowel movement:  Usually the best time of day for a bowel movement will be a half hour to an hour after breakfast.  For some people, a half hour to an hour after lunch will work better.  These times are best because the body uses the gastrocolic reflex, a stimulation of bowel motion that occurs with eating, to help produce a bowel movement.  2. Make sure that you are not rushed and have convenient access to a bathroom at your selected time.  3. Eat all your meals at a predictable time each day.  The bowel functions best when food is introduced at the same regular intervals.  4. The amount of food eaten at a given time of day should be about the same size from day to day.  The bowel functions best when food is introduced in similar quantities from day to day.  It is fine to have a small breakfast and a large lunch, or vice versa, just be consistent.  5. Eat two servings of fruit or vegetables and at least one serving of complex carbohydrates (whole grains such as brown rice, bran, whole wheat bread, or oatmeal) at each meal.   A serving of fruit or vegetables is a half-cup or medium-sized piece of fruit.  A serving of a complex carbohydrate is a half-cup or a slice of bread.  It is often desirable to eat more than the recommended minimum amounts of fruits, vegetables, and complex carbohydrates.  6. Drink plenty of water-ideally eight glasses a day.  7. Until regular bowel movements are established at a desired time of day, take 2-3 dried prunes (or  to 1/3 cup of prune juice) each night to stimulate morning bowel function.  8. Exercise daily.  You may exercise at any time of day, but you may find that bowel function is helped most if the exercise is at a consistent time each  day.   Types of Fiber  There are two main types of fiber:  insoluble and soluble.  Both of these types can prevent and relieve constipation and diarrhea, although some people find one or the other to be more easily digested.  This handout details information about both types of fiber.  Insoluble Fiber       Functions of Insoluble Fiber . moves bulk through the intestines  . controls and balances the pH (acidity) in the intestines       Benefits of Insoluble Fiber . promotes regular bowel movement and prevents constipation  . removes fecal waste through colon in less time  . keeps an optimal pH in intestines to prevent microbes from producing cancer substances, therefore preventing colon cancer        Food Sources of Insoluble Fiber . whole-wheat products  . wheat bran "miller's bran" . corn bran  . flax seed or other seeds . vegetables such as green beans, broccoli, cauliflower and potato skins  . fruit skins and root vegetable skins  . popcorn . brown rice  Soluble Fiber       Functions of Soluble Fiber  . holds water in the colon to bulk and soften the stool . prolongs stomach emptying time so that sugar is released and absorbed more slowly  Benefits of Soluble Fiber . lowers total cholesterol and LDL cholesterol (the bad cholesterol) therefore reducing the risk of heart disease  . regulates blood sugar for people with diabetes       Food Sources of Soluble Fiber . oat/oat bran . dried beans and peas  . nuts  . barley  . flax seed or other seeds . fruits such as oranges, pears, peaches, and apples  . vegetables such as carrots  . psyllium husk  . prunes  Relaxation Exercises with the Urge to Void   When you experience an urge to void:  FIRST  Stop and stand very still    Sit down if you can    Don't move    You need to stay very still to maintain control  SECOND Squeeze your pelvic floor muscles 5 times, like a quick flick, to keep from  leaking  THIRD Relax  Take a deep breath and then let it out  Try to make the urge go away by using relaxation and visualization techniques  FINALLY When you feel the urge go away somewhat, walk normally to the bathroom.   If the urge gets suddenly stronger on the way, you may stop again and relax to regain control.   [facility] [address] [place], [place] [address] Phone # [phone] Fax [phone]

## 2017-11-08 ENCOUNTER — Ambulatory Visit: Payer: Medicare HMO | Attending: Family Medicine | Admitting: Physical Therapy

## 2017-11-08 ENCOUNTER — Encounter: Payer: Self-pay | Admitting: Physical Therapy

## 2017-11-08 ENCOUNTER — Ambulatory Visit: Payer: Medicare HMO | Admitting: Family Medicine

## 2017-11-08 ENCOUNTER — Encounter: Payer: Self-pay | Admitting: Family Medicine

## 2017-11-08 VITALS — BP 130/88 | HR 88 | Temp 97.6°F | Wt 176.5 lb

## 2017-11-08 DIAGNOSIS — L97511 Non-pressure chronic ulcer of other part of right foot limited to breakdown of skin: Secondary | ICD-10-CM | POA: Diagnosis not present

## 2017-11-08 DIAGNOSIS — R279 Unspecified lack of coordination: Secondary | ICD-10-CM

## 2017-11-08 DIAGNOSIS — L84 Corns and callosities: Secondary | ICD-10-CM | POA: Diagnosis not present

## 2017-11-08 DIAGNOSIS — M6281 Muscle weakness (generalized): Secondary | ICD-10-CM | POA: Diagnosis not present

## 2017-11-08 DIAGNOSIS — R252 Cramp and spasm: Secondary | ICD-10-CM | POA: Diagnosis not present

## 2017-11-08 MED ORDER — CEPHALEXIN 500 MG PO CAPS: 500.0000 mg | ORAL_CAPSULE | Freq: Four times a day (QID) | ORAL | 0 refills | Status: DC

## 2017-11-08 NOTE — Therapy (Signed)
[facility] [address], [place], [place], [address] Phone: [phone]   Fax:  [phone]  Physical Therapy Treatment  Patient Details  Name: Daniel [place] MRN: [id number] Date of Birth: 06-12-1951 Referring Provider: Gatha Mayer, MD   Encounter Date: 11/08/2017  PT End of Session - 11/08/17 0931    Visit Number  2    Number of Visits  10    Date for PT Re-Evaluation  12/27/17    Authorization Type  medicare gcodes at visit 10    PT Start Time  0848    PT Stop Time  0928    PT Time Calculation (min)  40 min    Activity Tolerance  Patient tolerated treatment well    Behavior During Therapy  [facility] for tasks assessed/performed       Past Medical History:  Diagnosis Date  . Allergy   . Asthma   . Diverticulosis   . Elevated PSA   . GERD (gastroesophageal reflux disease)   . Hemorrhoids   . Hx of adenomatous colonic polyps   . Hyperlipidemia   . Hypertension   . Hypogonadism in male     Past Surgical History:  Procedure Laterality Date  . ANAL RECTAL MANOMETRY N/A 09/07/2017   Procedure: ANO RECTAL MANOMETRY;  Surgeon: Mauri Pole, MD;  Location: [facility];  Service: Endoscopy;  Laterality: N/A;  . COLONOSCOPY W/ BIOPSIES    . HEMORRHOID BANDING    . HEMORRHOID BANDING      There were no vitals filed for this visit.  Subjective Assessment - 11/08/17 0915    Subjective  Pt states he has been trying to control the urge to go and maybe is down to 3-4x/day.    Pertinent History  hx of hemorrhoid banding, goes by "Worth"    Limitations  Walking;Other (comment)    Patient Stated Goals  have less BM, less anal irritation, avoid hemorrhoid banding surgery    Currently in Pain?  No/denies                      [facility] Adult PT Treatment/Exercise - 11/08/17 0001      Self-Care   Other Self-Care Comments   toilet techniques      Neuro Re-ed    Neuro Re-ed Details   diaphragmatic  breathing lying down and with stretches      Manual Therapy   Manual Therapy  Myofascial release;Soft tissue mobilization;Muscle Energy Technique    Soft tissue mobilization  lumbar and thoracic paraspinals    Myofascial Release  pelvic and thoracic diaphragm in4 planes    Muscle Energy Technique  breathing with ribcage movements             PT Education - 11/08/17 0931    Education provided  Yes    Education Details  stretches and toilet techniques    Person(s) Educated  Patient    Methods  Explanation;Demonstration;Handout    Comprehension  Verbalized understanding;Returned demonstration       PT Short Term Goals - 11/01/17 1100      PT SHORT TERM GOAL #1   Title  pt will be independent with initial HEP    Time  4    Period  Weeks    Status  New    Target Date  11/29/17      PT SHORT TERM GOAL #2   Title  pt will reduce number of BM to 3x/day  Time  4    Period  Weeks    Status  New    Target Date  11/29/17      PT SHORT TERM GOAL #3   Title  Pt will report 25% less anal pain or discomfort with BM    Time  4    Period  Weeks    Status  New    Target Date  11/29/17        PT Long Term Goals - 11/01/17 1102      PT LONG TERM GOAL #1   Title  pt will be independent with advanced HEP    Time  8    Period  Weeks    Status  New    Target Date  12/27/17      PT LONG TERM GOAL #2   Title  Pt will be able to go for 30 minute car ride without stopping to use the bathroom    Time  8    Period  Weeks    Status  New    Target Date  12/27/17      PT LONG TERM GOAL #3   Title  pt will be able to play golf without stopping to use the bathroom    Time  8    Period  Weeks    Status  New    Target Date  12/27/17      PT LONG TERM GOAL #4   Title  pt will report emptying bowels completely due to improved muscle coordination and tone, demonstrated by no fecal matter coming out after performing toileting hygeine     Time  8    Period  Weeks    Status  New     Target Date  12/27/17      PT LONG TERM GOAL #5   Title  pt reports 50% less pain before, during, and after bowel movement    Time  8    Period  Weeks    Status  New    Target Date  12/27/17            Plan - 11/08/17 0932    Clinical Impression Statement  Patient has difficulty with ribcage movements during breathing.  He responded well to manual techniques to thoracic.  Pt was able to perform stretches correctly andreported some low back stiffness after doing stretches but no pain.  Pt will benefit from skllled PT to improve soft tissue and length for improved BM.    Rehab Potential  Good    PT Treatment/Interventions  ADLs/Self Care Home Management;Biofeedback;Cryotherapy;Moist Heat;Therapeutic activities;Therapeutic exercise;Patient/family education;Neuromuscular re-education;Passive range of motion;Dry needling;Taping;Electrical Stimulation;Iontophoresis 62m/ml Dexamethasone    PT Next Visit Plan  Anal distress PFDI, ab massage, toilet techniques, hip stretches, puborectalis MET, EMG biofeedback for relax/contract anal coordination    Consulted and Agree with Plan of Care  Patient       Patient will benefit from skilled therapeutic intervention in order to improve the following deficits and impairments:  Pain, Decreased strength, Increased muscle spasms, Decreased coordination  Visit Diagnosis: Muscle weakness (generalized)  Cramp and spasm  Unspecified lack of coordination     Problem List Patient Active Problem List   Diagnosis Date Noted  . Constipation due to outlet dysfunction   . Incontinence of feces with fecal urgency   . Anemia, iron deficiency 08/21/2014  . Prediabetes 03/26/2014  . Onychomycosis 12/28/2013  . Pain, foot 12/28/2013  . Metatarsalgia of both feet 06/22/2013  .  Metatarsal deformity 06/22/2013  . ELEVATED PROSTATE SPECIFIC ANTIGEN 09/18/2010  . TESTICULAR HYPOFUNCTION 08/17/2010  . HYPERLIPIDEMIA 08/17/2010  . Essential hypertension  08/17/2010  . ALLERGIC RHINITIS 08/17/2010  . GERD 08/17/2010    [place], PT 11/08/2017, 9:49 AM  [facility] [address], [place], [place], [address] Phone: [phone]   Fax:  [phone]  Name: RAS KOLLMAN MRN: [id number] Date of Birth: 07-Sep-1951

## 2017-11-08 NOTE — Patient Instructions (Signed)
Keep clean with soap and water.   Follow up for any increased redness or swelling.  Touch base in 2-3 weeks if not improving.

## 2017-11-08 NOTE — Progress Notes (Signed)
Subjective:     Patient ID: [place], male   DOB: 02-Aug-1951, 66 y.o.   MRN: [id number]  HPI Patient seen with callused area on the inside of his right fifth toe. He cycles a lot but denies any injury. He just noticed this  past Saturday. He has little bit of soreness. No drainage. He has history of prediabetes. No history of peripheral vascular disease. Denies recent change of shoe wear. No noticed a little bit of redness past couple days.     Past Medical History:  Diagnosis Date  . Allergy   . Asthma   . Diverticulosis   . Elevated PSA   . GERD (gastroesophageal reflux disease)   . Hemorrhoids   . Hx of adenomatous colonic polyps   . Hyperlipidemia   . Hypertension   . Hypogonadism in male    Past Surgical History:  Procedure Laterality Date  . ANAL RECTAL MANOMETRY N/A 09/07/2017   Procedure: ANO RECTAL MANOMETRY;  Surgeon: Mauri Pole, MD;  Location: [facility];  Service: Endoscopy;  Laterality: N/A;  . COLONOSCOPY W/ BIOPSIES    . HEMORRHOID BANDING    . HEMORRHOID BANDING      reports that he quit smoking about 29 years ago. His smoking use included cigarettes. He has a 7.50 pack-year smoking history. he has never used smokeless tobacco. He reports that he drinks alcohol. He reports that he does not use drugs. family history includes Alcohol abuse in his father; Melanoma (age of onset: 49) in his brother; Suicidality in his father. No Known Allergies   Review of Systems  Constitutional: Negative for chills and fever.       Objective:   Physical Exam  Constitutional: He appears well-developed and well-nourished.  Cardiovascular: Normal rate and regular rhythm.  Pulmonary/Chest: Effort normal and breath sounds normal. No respiratory distress. He has no wheezes. He has no rales.  Skin:  Patient has superficial ulcer medial aspect right fifth toe. He has thick callused corn around the periphery. No foul odor. No necrosis. Good granulation tissue. Diameter of  ulcer is about 1/2 cm. Mild surrounding erythema       Assessment:     Callus/corn right fifth toe with early ulcer and possible early cellulitis changes    Plan:     -We recommend trimming periphery of nonviable tissue around the corn and callus and patient consented. Using #15 blade we trimmed off callused area. -Start Keflex 500 mg 3 times a day for 7 days -We've recommended some padding to prevent friction against the next toe -We recommended touch base in 2 weeks for follow-up and sooner for any increased redness or signs of increasing infection  Eulas Post MD [facility]

## 2017-11-08 NOTE — Patient Instructions (Signed)
Windshield wipers  Let both knees drop to other side. Repeat side to side with breathing 10 times.    DOUBLE KNEE TO CHEST STRETCH - DKTC (Also do single knee to chest)  While Lying on your back,  hold your knees and gently pull them up towards your chest. Hold 30 sec do 3x    Piriformis Stretch (Figure Four)  While lying down, bend up one knee keeping the foot on the mat or floor. Bend opposite leg and cross ankle over the bent knee. Gently push inside of crossed leg at knee. You should feel the stretch in the back of the buttock of crossed leg.   Hold 30 sec repeat 3x   HAMSTRING STRETCH - SUPINE  While lying on your back, raise up your leg and hold the back of your knee until a stretch is felt. Hold 30 sec repeat 3 x    Toileting Techniques for Bowel Movements (Defecation) Using your belly (abdomen) and pelvic floor muscles to have a bowel movement is usually instinctive.  Sometimes people can have problems with these muscles and have to relearn proper defecation (emptying) techniques.  If you have weakness in your muscles, organs that are falling out, decreased sensation in your pelvis, or ignore your urge to go, you may find yourself straining to have a bowel movement.  You are straining if you are: . holding your breath or taking in a huge gulp of air and holding it  . keeping your lips and jaw tensed and closed tightly . turning red in the face because of excessive pushing or forcing . developing or worsening your  hemorrhoids . getting faint while pushing . not emptying completely and have to defecate many times a day  If you are straining, you are actually making it harder for yourself to have a bowel movement.  Many people find they are pulling up with the pelvic floor muscles and closing off instead of opening the anus. Due to lack pelvic floor relaxation and coordination the abdominal muscles, one has to work harder to push the feces out.  Many people have never  been taught how to defecate efficiently and effectively.  Notice what happens to your body when you are having a bowel movement.  While you are sitting on the toilet pay attention to the following areas: . Jaw and mouth position . Angle of your hips   . Whether your feet touch the ground or not . Arm placement  . Spine position . Waist . Belly tension . Anus (opening of the anal canal)  An Evacuation/Defecation Plan   Here are the 4 basic points:  1. Lean forward enough for your elbows to rest on your knees 2. Support your feet on the floor or use a low stool if your feet don't touch the floor  3. Push out your belly as if you have swallowed a beach ball-you should feel a widening of your waist 4. Open and relax your pelvic floor muscles, rather than tightening around the anus      The following conditions my require modifications to your toileting posture:  . If you have had surgery in the past that limits your back, hip, pelvic, knee or ankle flexibility . Constipation   Your healthcare practitioner may make the following additional suggestions and adjustments:  1) Sit on the toilet  a) Make sure your feet are supported. b) Notice your hip angle and spine position-most people find it effective to lean forward or  raise their knees, which can help the muscles around the anus to relax  c) When you lean forward, place your forearms on your thighs for support  2) Relax suggestions a) Breath deeply in through your nose and out slowly through your mouth as if you are smelling the flowers and blowing out the candles. b) To become aware of how to relax your muscles, contracting and releasing muscles can be helpful.  Pull your pelvic floor muscles in tightly by using the image of holding back gas, or closing around the anus (visualize making a circle smaller) and lifting the anus up and in.  Then release the muscles and your anus should drop down and feel open. Repeat 5 times ending with  the feeling of relaxation. c) Keep your pelvic floor muscles relaxed; let your belly bulge out. d) The digestive tract starts at the mouth and ends at the anal opening, so be sure to relax both ends of the tube.  Place your tongue on the roof of your mouth with your teeth separated.  This helps relax your mouth and will help to relax the anus at the same time.  3) Empty (defecation) a) Keep your pelvic floor and sphincter relaxed, then bulge your anal muscles.  Make the anal opening wide.  b) Stick your belly out as if you have swallowed a beach ball. c) Make your belly wall hard using your belly muscles while continuing to breathe. Doing this makes it easier to open your anus. d) Breath out and give a grunt (or try using other sounds such as ahhhh, shhhhh, ohhhh or grrrrrrr).  4) Finish a) As you finish your bowel movement, pull the pelvic floor muscles up and in.  This will leave your anus in the proper place rather than remaining pushed out and down. If you leave your anus pushed out and down, it will start to feel as though that is normal and give you incorrect signals about needing to have a bowel movement.     [facility] [address] [place], [place] [address]

## 2017-11-14 ENCOUNTER — Encounter: Payer: Self-pay | Admitting: Family Medicine

## 2017-11-14 DIAGNOSIS — M79674 Pain in right toe(s): Secondary | ICD-10-CM

## 2017-11-15 ENCOUNTER — Ambulatory Visit: Payer: Medicare HMO | Admitting: Physical Therapy

## 2017-11-15 ENCOUNTER — Encounter: Payer: Self-pay | Admitting: Physical Therapy

## 2017-11-15 DIAGNOSIS — R252 Cramp and spasm: Secondary | ICD-10-CM | POA: Diagnosis not present

## 2017-11-15 DIAGNOSIS — R279 Unspecified lack of coordination: Secondary | ICD-10-CM

## 2017-11-15 DIAGNOSIS — M6281 Muscle weakness (generalized): Secondary | ICD-10-CM | POA: Diagnosis not present

## 2017-11-15 NOTE — Therapy (Signed)
[facility] [address] [place], [place], [address] Phone: [phone]   Fax:  [phone]  Physical Therapy Treatment  Patient Details  Name: [place] MRN: [id number] Date of Birth: 1951/06/10 Referring Provider: Gatha Mayer, MD   Encounter Date: 11/15/2017  PT End of Session - 11/15/17 1444    Visit Number  3    Number of Visits  10    Date for PT Re-Evaluation  12/27/17    Authorization Type  medicare gcodes at visit 10    PT Start Time  1455    PT Stop Time  1535    PT Time Calculation (min)  40 min    Activity Tolerance  Patient tolerated treatment well    Behavior During Therapy  [facility] for tasks assessed/performed       Past Medical History:  Diagnosis Date  . Allergy   . Asthma   . Diverticulosis   . Elevated PSA   . GERD (gastroesophageal reflux disease)   . Hemorrhoids   . Hx of adenomatous colonic polyps   . Hyperlipidemia   . Hypertension   . Hypogonadism in male     Past Surgical History:  Procedure Laterality Date  . ANAL RECTAL MANOMETRY N/A 09/07/2017   Procedure: ANO RECTAL MANOMETRY;  Surgeon: Mauri Pole, MD;  Location: [facility];  Service: Endoscopy;  Laterality: N/A;  . COLONOSCOPY W/ BIOPSIES    . HEMORRHOID BANDING    . HEMORRHOID BANDING      There were no vitals filed for this visit.  Subjective Assessment - 11/15/17 1507    Subjective  I have only been going 2x and sometimes 3 times per day.  I am noticing some improvement    Pertinent History  hx of hemorrhoid banding, goes by "Worth"    Limitations  Walking;Other (comment)    Diagnostic tests  anorectal manometry (dyssynergia)    Patient Stated Goals  have less BM, less anal irritation, avoid hemorrhoid banding surgery    Currently in Pain?  No/denies                      [facility] Adult PT Treatment/Exercise - 11/15/17 0001      Self-Care   Other Self-Care Comments   review HEP      Neuro Re-ed    Neuro Re-ed Details   assessment pelvic floor EMG: rest 4.35; contrct relax 30.8 max, time 60.67; 10 sec hold 10.21 mV average; 20 sec hold 10.65 ave; rest 5.57 mV contract and hold 5 sec at 50% contraction - 8 x               PT Short Term Goals - 11/15/17 1501      PT SHORT TERM GOAL #1   Title  pt will be independent with initial HEP    Time  4    Period  Weeks    Status  Achieved      PT SHORT TERM GOAL #2   Title  pt will reduce number of BM to 3x/day    Baseline  2-3x    Time  4    Period  Weeks    Status  Achieved      PT SHORT TERM GOAL #3   Title  Pt will report 25% less anal pain or discomfort with BM    Baseline  pt states he doesn't normally have pain    Time  4  Period  Weeks    Status  Deferred        PT Long Term Goals - 11/15/17 1537      PT LONG TERM GOAL #5   Title  pt reports 50% less pain before, during, and after bowel movement    Baseline  pt reports he doesn't normally have pain    Status  Deferred            Plan - 11/15/17 1538    Clinical Impression Statement  Patient was able to demonstrate ability to contract and relax pelvic floor on command using EMG.  He was not able to relax quickly enough to perform all 3 contractions within 4 seconds.  Pt was also holding pelvic floor at a high resting tone >39mV.  He has been able to use the urge to void techniques with bowel movements and has reduced the number of bowel movements he is having down to 2 or 3 from 5 or 6.  Pt will continue to need skilled PT work on reduced resting tone for improved bowel function.    PT Treatment/Interventions  ADLs/Self Care Home Management;Biofeedback;Cryotherapy;Moist Heat;Therapeutic activities;Therapeutic exercise;Patient/family education;Neuromuscular re-education;Passive range of motion;Dry needling;Taping;Electrical Stimulation;Iontophoresis 4mg /ml Dexamethasone    PT Next Visit Plan  internal contract/relax to puborectalis, ab massage,  hip stretches    Consulted and Agree with Plan of Care  Patient       Patient will benefit from skilled therapeutic intervention in order to improve the following deficits and impairments:  Pain, Decreased strength, Increased muscle spasms, Decreased coordination  Visit Diagnosis: Muscle weakness (generalized)  Cramp and spasm  Unspecified lack of coordination     Problem List Patient Active Problem List   Diagnosis Date Noted  . Constipation due to outlet dysfunction   . Incontinence of feces with fecal urgency   . Anemia, iron deficiency 08/21/2014  . Prediabetes 03/26/2014  . Onychomycosis 12/28/2013  . Pain, foot 12/28/2013  . Metatarsalgia of both feet 06/22/2013  . Metatarsal deformity 06/22/2013  . ELEVATED PROSTATE SPECIFIC ANTIGEN 09/18/2010  . TESTICULAR HYPOFUNCTION 08/17/2010  . HYPERLIPIDEMIA 08/17/2010  . Essential hypertension 08/17/2010  . ALLERGIC RHINITIS 08/17/2010  . GERD 08/17/2010    Zannie Cove, PT 11/15/2017, 3:44 PM  [facility] [address] [place], [place], [address] Phone: [phone]   Fax:  [phone]  Name: [place] MRN: [id number] Date of Birth: 1951/10/03

## 2017-11-17 ENCOUNTER — Encounter: Payer: Self-pay | Admitting: Family Medicine

## 2017-11-17 DIAGNOSIS — M792 Neuralgia and neuritis, unspecified: Secondary | ICD-10-CM | POA: Diagnosis not present

## 2017-11-17 DIAGNOSIS — L97512 Non-pressure chronic ulcer of other part of right foot with fat layer exposed: Secondary | ICD-10-CM | POA: Diagnosis not present

## 2017-11-17 DIAGNOSIS — Z79899 Other long term (current) drug therapy: Secondary | ICD-10-CM | POA: Diagnosis not present

## 2017-11-17 DIAGNOSIS — M2041 Other hammer toe(s) (acquired), right foot: Secondary | ICD-10-CM | POA: Diagnosis not present

## 2017-11-22 ENCOUNTER — Ambulatory Visit: Payer: Medicare HMO | Admitting: Physical Therapy

## 2017-11-22 ENCOUNTER — Encounter: Payer: Self-pay | Admitting: Physical Therapy

## 2017-11-22 DIAGNOSIS — M6281 Muscle weakness (generalized): Secondary | ICD-10-CM

## 2017-11-22 DIAGNOSIS — R279 Unspecified lack of coordination: Secondary | ICD-10-CM | POA: Diagnosis not present

## 2017-11-22 DIAGNOSIS — R252 Cramp and spasm: Secondary | ICD-10-CM | POA: Diagnosis not present

## 2017-11-22 NOTE — Therapy (Addendum)
[facility] [address] [place], [place], [address] Phone: [phone]   Fax:  [phone]  Physical Therapy Treatment  Patient Details  Name: Daniel Sullivan MRN: [id number] Date of Birth: 1951/04/28 Referring Provider: Gatha Mayer, MD   Encounter Date: 11/22/2017  PT End of Session - 11/22/17 0846    Visit Number  4    Number of Visits  10    Date for PT Re-Evaluation  12/27/17    Authorization Type  medicare gcodes at visit 10    PT Start Time  0846    PT Stop Time  0926    PT Time Calculation (min)  40 min    Activity Tolerance  Patient tolerated treatment well    Behavior During Therapy  [facility] for tasks assessed/performed       Past Medical History:  Diagnosis Date  . Allergy   . Asthma   . Diverticulosis   . Elevated PSA   . GERD (gastroesophageal reflux disease)   . Hemorrhoids   . Hx of adenomatous colonic polyps   . Hyperlipidemia   . Hypertension   . Hypogonadism in male     Past Surgical History:  Procedure Laterality Date  . ANAL RECTAL MANOMETRY N/A 09/07/2017   Procedure: ANO RECTAL MANOMETRY;  Surgeon: Mauri Pole, MD;  Location: [facility];  Service: Endoscopy;  Laterality: N/A;  . COLONOSCOPY W/ BIOPSIES    . HEMORRHOID BANDING    . HEMORRHOID BANDING      There were no vitals filed for this visit.  Subjective Assessment - 11/22/17 0847    Subjective  I had a little set back after eating red meat my stomach was upset.  I didn't sleep well last night but overall it's okay    Pertinent History  hx of hemorrhoid banding, goes by "Worth"    Patient Stated Goals  have less BM, less anal irritation, avoid hemorrhoid banding surgery    Currently in Pain?  No/denies                      [facility] Adult PT Treatment/Exercise - 11/22/17 0001      Neuro Re-ed    Neuro Re-ed Details   breathing with contract/relax of pelvic floor with internal cues to relax muscles       Exercises   Exercises  Lumbar      Lumbar Exercises: Stretches   Press Ups  3 reps;30 seconds    Piriformis Stretch  3 reps;20 seconds      Lumbar Exercises: Supine   Other Supine Lumbar Exercises  foam roll horizontally under sacrum for thoracolumbar and gluteal fascial release - knee to chest and rocking side to side      Manual Therapy   Manual Therapy  Internal Pelvic Floor    Soft tissue mobilization  bilateral levator, coccygeus, puborectalis, and coccyx mobs flex/ext     Internal Pelvic Floor  pt informed and consent given to perform internal soft tissue mobs             PT Education - 11/22/17 0927    Education provided  Yes    Education Details  dilator, prone press up    Person(s) Educated  Patient    Methods  Explanation;Demonstration;Handout;Verbal cues;Tactile cues    Comprehension  Verbalized understanding;Returned demonstration       PT Short Term Goals - 11/15/17 1501      PT SHORT TERM  GOAL #1   Title  pt will be independent with initial HEP    Time  4    Period  Weeks    Status  Achieved      PT SHORT TERM GOAL #2   Title  pt will reduce number of BM to 3x/day    Baseline  2-3x    Time  4    Period  Weeks    Status  Achieved      PT SHORT TERM GOAL #3   Title  Pt will report 25% less anal pain or discomfort with BM    Baseline  pt states he doesn't normally have pain    Time  4    Period  Weeks    Status  Deferred        PT Long Term Goals - 11/22/17 9323      PT LONG TERM GOAL #1   Title  pt will be independent with advanced HEP    Time  8    Period  Weeks    Status  On-going      PT LONG TERM GOAL #2   Title  Pt will be able to go for 30 minute car ride without stopping to use the bathroom    Time  8    Period  Weeks    Status  On-going      PT LONG TERM GOAL #3   Title  pt will be able to play golf without stopping to use the bathroom    Time  8    Period  Weeks    Status  On-going      PT LONG TERM GOAL #4    Title  pt will report emptying bowels completely due to improved muscle coordination and tone, demonstrated by no fecal matter coming out after performing toileting hygeine     Time  8    Period  Weeks    Status  On-going            Plan - 11/22/17 0931    Clinical Impression Statement  Pt tolerated internal soft tissue release and demonstrated increased tissue length.  Pt will benefit from using dilator and was educated in use at home and where to purchase.  pt had small amount of bleeding of hemmorrhoid this morning.  Pt continues to have tight lumbar and increased thoracic kyphosis and will benefit from skilled therapy for improved mobilty and abilty to control pelvic floor muscles.    PT Treatment/Interventions  ADLs/Self Care Home Management;Biofeedback;Cryotherapy;Moist Heat;Therapeutic activities;Therapeutic exercise;Patient/family education;Neuromuscular re-education;Passive range of motion;Dry needling;Taping;Electrical Stimulation;Iontophoresis 27m/ml Dexamethasone    PT Next Visit Plan  internal contract/relax to puborectalis, ab massage, hip stretches, lumbar and thoracic soft tissue and ROM    Consulted and Agree with Plan of Care  Patient       Patient will benefit from skilled therapeutic intervention in order to improve the following deficits and impairments:  Pain, Decreased strength, Increased muscle spasms, Decreased coordination  Visit Diagnosis: Muscle weakness (generalized)  Cramp and spasm  Unspecified lack of coordination     Problem List Patient Active Problem List   Diagnosis Date Noted  . Constipation due to outlet dysfunction   . Incontinence of feces with fecal urgency   . Anemia, iron deficiency 08/21/2014  . Prediabetes 03/26/2014  . Onychomycosis 12/28/2013  . Pain, foot 12/28/2013  . Metatarsalgia of both feet 06/22/2013  . Metatarsal deformity 06/22/2013  . ELEVATED PROSTATE SPECIFIC ANTIGEN 09/18/2010  .  TESTICULAR HYPOFUNCTION 08/17/2010   . HYPERLIPIDEMIA 08/17/2010  . Essential hypertension 08/17/2010  . ALLERGIC RHINITIS 08/17/2010  . GERD 08/17/2010    Zannie Cove, PT 11/22/2017, 9:34 AM  [facility] [address], [place], [place], [address] Phone: [phone]   Fax:  [phone]  Name: Daniel Sullivan MRN: [id number] Date of Birth: 06-28-1951  PHYSICAL THERAPY DISCHARGE SUMMARY  Visits from Start of Care: 4  Current functional level related to goals / functional outcomes: See above most recent update   Remaining deficits: See above most recent update   Education / Equipment: HEP  Plan: Patient agrees to discharge.  Patient goals were partially met. Patient is being discharged due to not returning since the last visit.  ?????    Patient was contacted via telephone and reported he has been dealing with other issues stemming from using an over the counter colon cleanse product.  States he may be interested in returning to PT at a later date.  Zannie Cove, PT 12/28/17 10:57 AM

## 2017-11-22 NOTE — Patient Instructions (Signed)
    [email or web address].es  Search anal dilator     Guide to Using a Anal Dilator ( No radiation) The anal dilator is used to stretch the anal canal from surgery or radiation. Is  a smooth plastic cylinder that is 6 inches long. It is used to stretch the anal canal to assist in having a bowel movement.  1.  Use the dilator your therapist has directed you to  2. Lubricate both the anus and tip of the dilator.  Do not use petroleum based lubricant due to increased risk of infection and more difficult to wash off.  3. Lay on your side to insert the tip of the dilator at a right angle to the rectum and lightly insert the dilator.  Exhale as you gently ease the dilator into the anal canal.  Breathe in deeply and inch the dilator deeper. See below for frequency: Anal stenosis- holds dilator in 2-3 minutes 2 times daily for 3-4 months Anal fissure or hemmorrhoid- hold dilator 2-3 minutes 1x/day 7-15 days (chill dilator Hemmorrhoidectomy- use 1 time per day for 1st month then every other day for 2nd month, and 2x/wk. for third month 4. Remove the dilator. Wash your hands and the dilator with warm water and soap. Let the dilator dry completely to prevent bacteria build-up.     PRONE ON ELBOWS - POE  Lying face down, slowly press up and prop yourself up on your elbows. Let your hips and low back relax and keep shoulders down away from the ears.  Take 5 breathes, then come back down, repeat 5x.  [place] [address], [place], [place] [address] Phone # [phone] Fax [phone]

## 2017-11-23 DIAGNOSIS — L97512 Non-pressure chronic ulcer of other part of right foot with fat layer exposed: Secondary | ICD-10-CM | POA: Diagnosis not present

## 2017-12-07 DIAGNOSIS — L97512 Non-pressure chronic ulcer of other part of right foot with fat layer exposed: Secondary | ICD-10-CM | POA: Diagnosis not present

## 2017-12-26 DIAGNOSIS — L97512 Non-pressure chronic ulcer of other part of right foot with fat layer exposed: Secondary | ICD-10-CM | POA: Diagnosis not present

## 2018-01-02 ENCOUNTER — Ambulatory Visit: Payer: Medicare HMO | Admitting: Internal Medicine

## 2018-01-02 ENCOUNTER — Encounter: Payer: Self-pay | Admitting: Internal Medicine

## 2018-01-02 VITALS — BP 124/68 | HR 74 | Ht 69.0 in | Wt 182.2 lb

## 2018-01-02 DIAGNOSIS — T50905A Adverse effect of unspecified drugs, medicaments and biological substances, initial encounter: Secondary | ICD-10-CM | POA: Diagnosis not present

## 2018-01-02 DIAGNOSIS — R152 Fecal urgency: Secondary | ICD-10-CM

## 2018-01-02 DIAGNOSIS — K580 Irritable bowel syndrome with diarrhea: Secondary | ICD-10-CM

## 2018-01-02 DIAGNOSIS — R159 Full incontinence of feces: Secondary | ICD-10-CM | POA: Diagnosis not present

## 2018-01-02 DIAGNOSIS — K648 Other hemorrhoids: Secondary | ICD-10-CM | POA: Diagnosis not present

## 2018-01-02 MED ORDER — RIFAXIMIN 550 MG PO TABS
550.0000 mg | ORAL_TABLET | Freq: Three times a day (TID) | ORAL | 0 refills | Status: DC
Start: 2018-01-02 — End: 2018-04-12

## 2018-01-02 NOTE — Assessment & Plan Note (Signed)
Back to PT

## 2018-01-02 NOTE — Patient Instructions (Addendum)
    We will make a referral back to PT.  Take the Xifaxan 1 three times a day for 14 days and give me an update in about 4-6 weeks as to what that did for your bowel movement problems.  Give [place] an update in 4-6 weeks with how your doing.  I appreciate the opportunity to care for you. Gatha Mayer, MD, [facility]

## 2018-01-02 NOTE — Progress Notes (Signed)
Daniel Sullivan 67 y.o. [birth year] [id number]  Assessment & Plan:   Encounter Diagnoses  Name Primary?  . Irritable bowel syndrome with diarrhea Yes  . Incontinence of feces with fecal urgency   . Adverse effect of supplement, initial encounter   . Hemorrhoids with complication     Trial of Xifaxan 550 mg tid x 14 d to see if it helps IBS - samples  Go back to pelvic PT - I think he could get further benefit with repeat Tx - worth a try given dyssynergic defecation  Stay off supplements and do not listen to Dr. 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 Irena Cords and tried 2 supplements - Revive Detox and [place] in an effort to ward off diabetes "I am pre-diabetic". They caused terrible gas and heartburn and bloating - he stopped them and it is subsiding   No Known Allergies  Current Outpatient Medications:  .  amLODipine (NORVASC) 5 MG tablet, TAKE 1 TABLET (5 MG TOTAL) BY MOUTH DAILY., Disp: 30 tablet, Rfl: 5 .  cephALEXin (KEFLEX) 500 MG capsule, Take 1 capsule (500 mg total) by mouth 4 (four) times daily., Disp: 21 capsule, Rfl: 0 .  fluticasone (FLONASE) 50 MCG/ACT nasal spray, USE 2 SPRAYS INTO BOTH NOSTRILS DAILY., Disp: 16 g, Rfl: 0 .  Glucosamine-Chondroit-Vit C-Mn TABS, Take by mouth daily. , Disp: , Rfl:  .  losartan (COZAAR) 50 MG tablet, TAKE 1 TABLET (50 MG TOTAL) BY MOUTH DAILY., Disp: 90 tablet, Rfl: 2 .  Multiple Vitamins-Minerals (MENS MULTI  VITAMIN & MINERAL PO), Take by mouth daily.  , Disp: , Rfl:  .  omeprazole (PRILOSEC) 20 MG capsule, TAKE 1 CAPSULE BY MOUTH EVERY DAY, Disp: 90 capsule, Rfl: 1 .  pravastatin (PRAVACHOL) 40 MG tablet, TAKE 1 TABLET BY MOUTH DAILY., Disp: 90 tablet, Rfl: 2 .  vitamin C (ASCORBIC ACID) 500 MG tablet, Take 500 mg by mouth daily., Disp: , Rfl:   Past Medical History:  Diagnosis Date  . Allergy   . Asthma   . Diverticulosis   . Elevated PSA   . GERD (gastroesophageal reflux disease)   . Hemorrhoids   . Hx of adenomatous colonic polyps   . Hyperlipidemia   . Hypertension   . Hypogonadism in male    Past Surgical History:  Procedure Laterality Date  . ANAL RECTAL MANOMETRY N/A 09/07/2017   Procedure: ANO RECTAL MANOMETRY;  Surgeon: Mauri Pole, MD;  Location: [facility];  Service: Endoscopy;  Laterality: N/A;  . COLONOSCOPY W/ BIOPSIES    . HEMORRHOID BANDING     Social History   Social History Narrative   Married, retired   Former smoker   + [place], no drugs   family history includes Alcohol abuse in his father; Melanoma (age of onset: 86) in his brother; Suicidality in his father.   Review of Systems As above  Objective:  Physical Exam BP 124/68   Pulse 74   Ht 5\' 9"  (1.753 m)   Wt 182 lb 4 oz (82.7 kg)   BMI 26.91 kg/m  NAD  25 minutes time spent with patient > half in counseling coordination of care

## 2018-01-03 DIAGNOSIS — L821 Other seborrheic keratosis: Secondary | ICD-10-CM | POA: Diagnosis not present

## 2018-01-03 DIAGNOSIS — L814 Other melanin hyperpigmentation: Secondary | ICD-10-CM | POA: Diagnosis not present

## 2018-01-03 DIAGNOSIS — L853 Xerosis cutis: Secondary | ICD-10-CM | POA: Diagnosis not present

## 2018-01-03 DIAGNOSIS — D229 Melanocytic nevi, unspecified: Secondary | ICD-10-CM | POA: Diagnosis not present

## 2018-01-03 DIAGNOSIS — Z85828 Personal history of other malignant neoplasm of skin: Secondary | ICD-10-CM | POA: Diagnosis not present

## 2018-01-17 ENCOUNTER — Other Ambulatory Visit: Payer: Self-pay | Admitting: Family Medicine

## 2018-01-18 DIAGNOSIS — L97511 Non-pressure chronic ulcer of other part of right foot limited to breakdown of skin: Secondary | ICD-10-CM | POA: Diagnosis not present

## 2018-03-22 ENCOUNTER — Other Ambulatory Visit: Payer: Self-pay | Admitting: Family Medicine

## 2018-03-29 ENCOUNTER — Other Ambulatory Visit: Payer: Self-pay | Admitting: Family Medicine

## 2018-04-11 ENCOUNTER — Ambulatory Visit: Payer: Self-pay | Admitting: *Deleted

## 2018-04-11 ENCOUNTER — Ambulatory Visit: Payer: Medicare HMO | Admitting: Family Medicine

## 2018-04-11 NOTE — Telephone Encounter (Signed)
Pt reports mild dizziness x 2 weeks. States B/P higher than usual. This AM 180/97 before medications(Norvasc and Cozaar) States high every AM, trends down throughout day. Reports mild dizziness x 2 weeks, "Little woozy in mornings,have to be careful." States B/P has been ranging  140's/ high 80's.  Denies any other symptoms; no headache, CP, SOB or weakness. Appt made for today with Dr. Elease Hashimoto.Care advise given per protocol.   Reason for Disposition . Systolic BP  >= 412 OR Diastolic >= 878  Answer Assessment - Initial Assessment Questions 1. BLOOD PRESSURE: "What is the blood pressure?" "Did you take at least two measurements 5 minutes apart?"     180/97 2. ONSET: "When did you take your blood pressure?"     This am before meds 3. HOW: "How did you obtain the blood pressure?" (e.g., visiting nurse, automatic home BP monitor)    Home monitor 4. HISTORY: "Do you have a history of high blood pressure?"     Yes 5. MEDICATIONS: "Are you taking any medications for blood pressure?" "Have you missed any doses recently?"     No 6. OTHER SYMPTOMS: "Do you have any symptoms?" (e.g., headache, chest pain, blurred vision, difficulty breathing, weakness)     Mild dizziness only  Protocols used: HIGH BLOOD PRESSURE-A-AH

## 2018-04-12 ENCOUNTER — Ambulatory Visit (INDEPENDENT_AMBULATORY_CARE_PROVIDER_SITE_OTHER): Payer: Medicare HMO | Admitting: Family Medicine

## 2018-04-12 ENCOUNTER — Encounter: Payer: Self-pay | Admitting: Family Medicine

## 2018-04-12 VITALS — BP 142/80 | HR 97 | Temp 98.0°F | Wt 179.2 lb

## 2018-04-12 DIAGNOSIS — I10 Essential (primary) hypertension: Secondary | ICD-10-CM | POA: Diagnosis not present

## 2018-04-12 MED ORDER — AMLODIPINE BESYLATE 10 MG PO TABS
10.0000 mg | ORAL_TABLET | Freq: Every day | ORAL | 3 refills | Status: DC
Start: 2018-04-12 — End: 2019-03-19

## 2018-04-12 NOTE — Progress Notes (Signed)
  Subjective:     Patient ID: Daniel Sullivan, male   DOB: [birth year], 67 y.o.   MRN: [id number]  HPI Patient has long-standing history of hypertension. Takes losartan 50 mg daily and amlodipine 5 mg daily. He states for the past few weeks he's had some intermittent lightheadedness and his blood pressure has been more erratic. He's had several readings up as high as 270 systolic. Most of his diastolics have been very well controlled. He had one day where he felt slightly lightheaded and flushed and had blood pressure 181/87 and after cycling had blood pressure 119/86. No regular alcohol use. No dietary changes. No chest pain. No consistent headaches.  Past Medical History:  Diagnosis Date  . Allergy   . Asthma   . Diverticulosis   . Elevated PSA   . GERD (gastroesophageal reflux disease)   . Hemorrhoids   . Hx of adenomatous colonic polyps   . Hyperlipidemia   . Hypertension   . Hypogonadism in male    Past Surgical History:  Procedure Laterality Date  . ANAL RECTAL MANOMETRY N/A 09/07/2017   Procedure: ANO RECTAL MANOMETRY;  Surgeon: Mauri Pole, MD;  Location: [facility];  Service: Endoscopy;  Laterality: N/A;  . COLONOSCOPY W/ BIOPSIES    . HEMORRHOID BANDING      reports that he quit smoking about 30 years ago. His smoking use included cigarettes. He has a 7.50 pack-year smoking history. He has never used smokeless tobacco. He reports that he drinks alcohol. He reports that he does not use drugs. family history includes Alcohol abuse in his father; Melanoma (age of onset: 58) in his brother; Suicidality in his father. No Known Allergies   Review of Systems  Constitutional: Negative for fatigue.  Eyes: Negative for visual disturbance.  Respiratory: Negative for cough, chest tightness and shortness of breath.   Cardiovascular: Negative for chest pain, palpitations and leg swelling.  Neurological: Negative for dizziness, syncope, weakness, light-headedness and headaches.        Objective:   Physical Exam  Constitutional: He is oriented to person, place, and time. He appears well-developed and well-nourished.  HENT:  Right Ear: External ear normal.  Left Ear: External ear normal.  Mouth/Throat: Oropharynx is clear and moist.  Eyes: Pupils are equal, round, and reactive to light.  Neck: Neck supple. No thyromegaly present.  Cardiovascular: Normal rate and regular rhythm.  Pulmonary/Chest: Effort normal and breath sounds normal. No respiratory distress. He has no wheezes. He has no rales.  Musculoskeletal: He exhibits no edema.  Neurological: He is alert and oriented to person, place, and time.       Assessment:     Hypertension. Suboptimal control by several home readings and slightly up today as well    Plan:     -Increase amlodipine to 10 mg daily and watch for peripheral edema -Continue losartan and continue regular exercise habits. Reassess in one month and bring home blood pressure cuff to check at that time. If still up at that point consider addition of low-dose HCTZ  Eulas Post MD [facility]

## 2018-04-12 NOTE — Patient Instructions (Signed)
Increase the Amlodipine to 10 mg by mouth once daily.  Bring in your cuff at follow up.

## 2018-05-26 ENCOUNTER — Encounter: Payer: Self-pay | Admitting: Family Medicine

## 2018-05-26 ENCOUNTER — Ambulatory Visit (INDEPENDENT_AMBULATORY_CARE_PROVIDER_SITE_OTHER): Payer: Medicare HMO | Admitting: Family Medicine

## 2018-05-26 VITALS — BP 140/80 | HR 92 | Temp 97.7°F | Wt 177.7 lb

## 2018-05-26 DIAGNOSIS — M542 Cervicalgia: Secondary | ICD-10-CM

## 2018-05-26 DIAGNOSIS — I10 Essential (primary) hypertension: Secondary | ICD-10-CM

## 2018-05-26 DIAGNOSIS — R42 Dizziness and giddiness: Secondary | ICD-10-CM

## 2018-05-26 NOTE — Progress Notes (Signed)
  Subjective:     Patient ID: Daniel Sullivan, male   DOB: 12-21-1950, 67 y.o.   MRN: [id number]  HPI Patient seen for the following issues  Cervical neck pain. This is mostly near the base of the neck. He cycles several times per week but denies any specific injury. He describes a dull ache which is mild to moderate intensity. Symptoms worse at night and particularly after prolonged periods of inactivity. Ibuprofen helps. Muscle massage and topicals have helped somewhat. Has tried some Aspercreme. Denies any radiculitis symptoms. No upper extremity weakness or numbness. No headaches.  Hypertension. Treated with amlodipine and losartan. Brings in home cuff today. Home blood pressures consistently 09G diastolic and around 283 systolic with occasional high around 150.  Occasional lightheadedness but symptoms very inconsistent. Thinks this maybe due to inadequate hydration.  Past Medical History:  Diagnosis Date  . Allergy   . Asthma   . Diverticulosis   . Elevated PSA   . GERD (gastroesophageal reflux disease)   . Hemorrhoids   . Hx of adenomatous colonic polyps   . Hyperlipidemia   . Hypertension   . Hypogonadism in male    Past Surgical History:  Procedure Laterality Date  . ANAL RECTAL MANOMETRY N/A 09/07/2017   Procedure: ANO RECTAL MANOMETRY;  Surgeon: Mauri Pole, MD;  Location: [facility];  Service: Endoscopy;  Laterality: N/A;  . COLONOSCOPY W/ BIOPSIES    . HEMORRHOID BANDING      reports that he quit smoking about 30 years ago. His smoking use included cigarettes. He has a 7.50 pack-year smoking history. He has never used smokeless tobacco. He reports that he drinks alcohol. He reports that he does not use drugs. family history includes Alcohol abuse in his father; Melanoma (age of onset: 81) in his brother; Suicidality in his father. No Known Allergies   Review of Systems  Constitutional: Negative for appetite change, chills, fatigue, fever and unexpected weight  change.  Eyes: Negative for visual disturbance.  Respiratory: Negative for cough, chest tightness and shortness of breath.   Cardiovascular: Negative for chest pain, palpitations and leg swelling.  Neurological: Positive for light-headedness. Negative for syncope, weakness and headaches.       Objective:   Physical Exam  Constitutional: He is oriented to person, place, and time. He appears well-developed and well-nourished.  HENT:  Right Ear: External ear normal.  Left Ear: External ear normal.  Mouth/Throat: Oropharynx is clear and moist.  Eyes: Pupils are equal, round, and reactive to light.  Neck: Neck supple. No thyromegaly present.  Cardiovascular: Normal rate and regular rhythm.  Pulmonary/Chest: Effort normal and breath sounds normal. No respiratory distress. He has no wheezes. He has no rales.  Musculoskeletal: He exhibits no edema.  Fairly good range of motion cervical spine.  Neurological: He is alert and oriented to person, place, and time.  Full-strength upper extremities with symmetric reflexes upper extremities       Assessment:     #1 cervical neck pain. Suspect degenerative spondylosis. Nonfocal exam neurologically  #2 hypertension. Borderline control  #3 occasional lightheadedness. Is not describing consistent orthostatic symptoms    Plan:     -Avoid regular use of ibuprofen for neck given his age -Try conservative therapies including muscle massage, neck extension exercises, topicals -Consider cervical spine films for any progressive symptoms -Increase hydration- especially on hot days when he is out cycling  Eulas Post MD [facility]

## 2018-05-26 NOTE — Patient Instructions (Signed)
Be in touch if BP consistently > 150/90.

## 2018-06-01 ENCOUNTER — Encounter: Payer: Self-pay | Admitting: Internal Medicine

## 2018-06-01 ENCOUNTER — Encounter: Payer: Self-pay | Admitting: Family Medicine

## 2018-06-13 ENCOUNTER — Encounter: Payer: Medicare HMO | Admitting: Family Medicine

## 2018-06-13 ENCOUNTER — Ambulatory Visit (INDEPENDENT_AMBULATORY_CARE_PROVIDER_SITE_OTHER): Payer: Medicare HMO | Admitting: Family Medicine

## 2018-06-13 ENCOUNTER — Encounter: Payer: Self-pay | Admitting: Family Medicine

## 2018-06-13 VITALS — BP 140/90 | HR 80 | Temp 97.8°F | Ht 69.5 in | Wt 179.1 lb

## 2018-06-13 DIAGNOSIS — R5383 Other fatigue: Secondary | ICD-10-CM | POA: Diagnosis not present

## 2018-06-13 DIAGNOSIS — Z0000 Encounter for general adult medical examination without abnormal findings: Secondary | ICD-10-CM

## 2018-06-13 DIAGNOSIS — R7303 Prediabetes: Secondary | ICD-10-CM | POA: Diagnosis not present

## 2018-06-13 DIAGNOSIS — R972 Elevated prostate specific antigen [PSA]: Secondary | ICD-10-CM | POA: Diagnosis not present

## 2018-06-13 DIAGNOSIS — E785 Hyperlipidemia, unspecified: Secondary | ICD-10-CM | POA: Diagnosis not present

## 2018-06-13 DIAGNOSIS — R7989 Other specified abnormal findings of blood chemistry: Secondary | ICD-10-CM | POA: Diagnosis not present

## 2018-06-13 DIAGNOSIS — I10 Essential (primary) hypertension: Secondary | ICD-10-CM | POA: Diagnosis not present

## 2018-06-13 LAB — HEMOGLOBIN A1C: Hgb A1c MFr Bld: 6.3 % (ref 4.6–6.5)

## 2018-06-13 LAB — HEPATIC FUNCTION PANEL
ALK PHOS: 57 U/L (ref 39–117)
ALT: 24 U/L (ref 0–53)
AST: 31 U/L (ref 0–37)
Albumin: 4.6 g/dL (ref 3.5–5.2)
BILIRUBIN TOTAL: 0.5 mg/dL (ref 0.2–1.2)
Bilirubin, Direct: 0.1 mg/dL (ref 0.0–0.3)
TOTAL PROTEIN: 7.3 g/dL (ref 6.0–8.3)

## 2018-06-13 LAB — BASIC METABOLIC PANEL
BUN: 15 mg/dL (ref 6–23)
CALCIUM: 9.3 mg/dL (ref 8.4–10.5)
CO2: 35 mEq/L — ABNORMAL HIGH (ref 19–32)
CREATININE: 0.86 mg/dL (ref 0.40–1.50)
Chloride: 100 mEq/L (ref 96–112)
GFR: 93.9 mL/min (ref >60.00)
GLUCOSE: 120 mg/dL — AB (ref 70–99)
Potassium: 4.7 mEq/L (ref 3.5–5.1)
Sodium: 140 mEq/L (ref 135–145)

## 2018-06-13 LAB — TSH: TSH: 2.73 u[IU]/mL (ref 0.35–4.50)

## 2018-06-13 LAB — LIPID PANEL
Cholesterol: 198 mg/dL (ref 0–200)
HDL: 59.4 mg/dL (ref >39.00)
LDL Cholesterol: 114 mg/dL — ABNORMAL HIGH (ref 0–99)
NONHDL: 138.6
Total CHOL/HDL Ratio: 3
Triglycerides: 123 mg/dL (ref 0.0–149.0)
VLDL: 24.6 mg/dL (ref 0.0–40.0)

## 2018-06-13 LAB — CBC WITH DIFFERENTIAL/PLATELET
Basophils Absolute: 0 10*3/uL (ref 0.0–0.1)
Basophils Relative: 0.5 % (ref 0.0–3.0)
EOS ABS: 0.2 10*3/uL (ref 0.0–0.7)
Eosinophils Relative: 3.5 % (ref 0.0–5.0)
HEMATOCRIT: 42.6 % (ref 39.0–52.0)
Hemoglobin: 14.5 g/dL (ref 13.0–17.0)
LYMPHS PCT: 16.8 % (ref 12.0–46.0)
Lymphs Abs: 1 10*3/uL (ref 0.7–4.0)
MCHC: 34 g/dL (ref 30.0–36.0)
MCV: 94.5 fl (ref 78.0–100.0)
MONOS PCT: 9.9 % (ref 3.0–12.0)
Monocytes Absolute: 0.6 10*3/uL (ref 0.1–1.0)
NEUTROS PCT: 69.3 % (ref 43.0–77.0)
Neutro Abs: 4.3 10*3/uL (ref 1.4–7.7)
Platelets: 199 10*3/uL (ref 150.0–400.0)
RBC: 4.51 Mil/uL (ref 4.22–5.81)
RDW: 13.2 % (ref 11.5–15.5)
WBC: 6.2 10*3/uL (ref 4.0–10.5)

## 2018-06-13 LAB — TESTOSTERONE: Testosterone: 154.73 ng/dL — ABNORMAL LOW (ref 300.00–890.00)

## 2018-06-13 LAB — PSA: PSA: 5.61 ng/mL — AB (ref 0.10–4.00)

## 2018-06-13 NOTE — Progress Notes (Signed)
Subjective:     Patient ID: Daniel Sullivan, male   DOB: 06/10/1951, 67 y.o.   MRN: [id number]  HPI Patient seen for physical exam. His chronic prompt include history of hypertension, GERD, low testosterone, hyperlipidemia, elevated PSA, prediabetes  is on several medications for hypertension. Blood pressures usually run borderline high around 140/90. Reflux well controlled with omeprazole. He's had some fatigue issues especially with the heat this summer. No chest pain. Still cycles fairly regularly for exercise. No recent falls. No depression issues. Some pravastatin for hyperlipidemia.  History of hyperglycemia/prediabetes. No polyuria or polydipsia.  Health maintenance review:  -Tetanus up-to-date -Colonoscopy up-to-date -Patient has received Prevnar 13 and Pneumovax -Needs hepatitis C screening  Past Medical History:  Diagnosis Date  . Allergy   . Asthma   . Diverticulosis   . Elevated PSA   . GERD (gastroesophageal reflux disease)   . Hemorrhoids   . Hx of adenomatous colonic polyps   . Hyperlipidemia   . Hypertension   . Hypogonadism in male    Past Surgical History:  Procedure Laterality Date  . ANAL RECTAL MANOMETRY N/A 09/07/2017   Procedure: ANO RECTAL MANOMETRY;  Surgeon: Mauri Pole, MD;  Location: [facility];  Service: Endoscopy;  Laterality: N/A;  . COLONOSCOPY W/ BIOPSIES    . HEMORRHOID BANDING      reports that he quit smoking about 30 years ago. His smoking use included cigarettes. He has a 7.50 pack-year smoking history. He has never used smokeless tobacco. He reports that he drinks alcohol. He reports that he does not use drugs. family history includes Alcohol abuse in his father; Melanoma (age of onset: 47) in his brother; Suicidality in his father. No Known Allergies    Review of Systems  Constitutional: Positive for fatigue.  Eyes: Negative for visual disturbance.  Respiratory: Negative for cough, chest tightness and shortness of breath.    Cardiovascular: Negative for chest pain, palpitations and leg swelling.  Gastrointestinal: Negative for abdominal pain.  Endocrine: Negative for polydipsia and polyuria.  Genitourinary: Negative for dysuria.  Neurological: Negative for dizziness, syncope, weakness, light-headedness and headaches.       Objective:   Physical Exam  Constitutional: He is oriented to person, place, and time. He appears well-developed and well-nourished. No distress.  HENT:  Head: Normocephalic and atraumatic.  Right Ear: External ear normal.  Left Ear: External ear normal.  Mouth/Throat: Oropharynx is clear and moist.  Eyes: Pupils are equal, round, and reactive to light. Conjunctivae and EOM are normal.  Neck: Normal range of motion. Neck supple. No thyromegaly present.  Cardiovascular: Normal rate, regular rhythm and normal heart sounds.  No murmur heard. Pulmonary/Chest: No respiratory distress. He has no wheezes. He has no rales.  Abdominal: Soft. Bowel sounds are normal. He exhibits no distension and no mass. There is no tenderness. There is no rebound and no guarding.  Musculoskeletal: He exhibits no edema.  Lymphadenopathy:    He has no cervical adenopathy.  Neurological: He is alert and oriented to person, place, and time. He displays normal reflexes. No cranial nerve deficit.  Skin: No rash noted.  Psychiatric: He has a normal mood and affect.       Assessment:     Physical exam. Several health maintenance issues addressed as below    Plan:     -Check hepatitis C antibody -Recheck lipid and hepatic panel to follow-up his hyperlipidemia -Check hemoglobin A1c with prior history of prediabetes -Continue monitor blood pressures closely and be  in touch if consistently greater than 140/90 -Continue with yearly flu vaccine -check testosterone level per pt request with hx of low T -The natural history of prostate cancer and ongoing controversy regarding screening and potential treatment  outcomes of prostate cancer has been discussed with the patient. The meaning of a false positive PSA and a false negative PSA has been discussed. He indicates understanding of the limitations of this screening test and wishes  to proceed with screening PSA testing.   Eulas Post MD [facility] at [facility]

## 2018-06-13 NOTE — Patient Instructions (Signed)
I would like for you you to schedule a Medicare Annual Wellness Visit (AWV).   This is a yearly appointment with our Health Coach Wynetta Fines, RN) and is designed to develop a personalized prevention plan. This is not a head to toe physical, but rather an opportunity to prevent illness based on your current health and risk factors for disease.   Visits usually last 30-60 minutes and include various screenings for hearing, vision, depression, and dementia, falls, and safety concerns. The visit also includes diet and exercise counseling and information about advance directives.   This is also an opportunity to discuss appropriate health maintenance testing such as mammography, colonoscopy, lung cancer screening, and hepatitis C testing.   The AWV is fully covered by Medicare Part B if:  . You have had Part B for over 12 months, AND . You have not had an AWV in the past 12 months .  Please don't miss out on this opportunity! Set up your appointment today!

## 2018-06-14 LAB — HEPATITIS C ANTIBODY
Hepatitis C Ab: NONREACTIVE
SIGNAL TO CUT-OFF: 0.01 (ref <1.00)

## 2018-06-23 ENCOUNTER — Other Ambulatory Visit: Payer: Self-pay | Admitting: Family Medicine

## 2018-06-23 DIAGNOSIS — R972 Elevated prostate specific antigen [PSA]: Secondary | ICD-10-CM

## 2018-07-14 ENCOUNTER — Ambulatory Visit (INDEPENDENT_AMBULATORY_CARE_PROVIDER_SITE_OTHER): Payer: Medicare HMO | Admitting: Family Medicine

## 2018-07-14 ENCOUNTER — Encounter: Payer: Self-pay | Admitting: Family Medicine

## 2018-07-14 VITALS — BP 130/88 | HR 88 | Temp 98.0°F | Wt 179.6 lb

## 2018-07-14 DIAGNOSIS — M545 Low back pain, unspecified: Secondary | ICD-10-CM

## 2018-07-14 MED ORDER — MELOXICAM 15 MG PO TABS: 15.0000 mg | ORAL_TABLET | Freq: Every day | ORAL | 0 refills | Status: DC

## 2018-07-14 NOTE — Patient Instructions (Signed)

## 2018-07-14 NOTE — Progress Notes (Signed)
  Subjective:     Patient ID: Daniel Sullivan, male   DOB: 1951-10-31, 67 y.o.   MRN: [id number]  HPI Patient symptoms low back pain. Onset 6 days ago. No specific injury. He has remained active this week with weightlifting and even cycled 26 miles yesterday without difficulty. Location is right lower lumbar area. No significant radiation. Pain somewhat intermittent. Worse with golfing. No urine or stool incontinence. No fevers or chills. No dysuria. Mild relief with ibuprofen  Past Medical History:  Diagnosis Date  . Allergy   . Asthma   . Diverticulosis   . Elevated PSA   . GERD (gastroesophageal reflux disease)   . Hemorrhoids   . Hx of adenomatous colonic polyps   . Hyperlipidemia   . Hypertension   . Hypogonadism in male    Past Surgical History:  Procedure Laterality Date  . ANAL RECTAL MANOMETRY N/A 09/07/2017   Procedure: ANO RECTAL MANOMETRY;  Surgeon: Mauri Pole, MD;  Location: [facility];  Service: Endoscopy;  Laterality: N/A;  . COLONOSCOPY W/ BIOPSIES    . HEMORRHOID BANDING      reports that he quit smoking about 30 years ago. His smoking use included cigarettes. He has a 7.50 pack-year smoking history. He has never used smokeless tobacco. He reports that he drinks alcohol. He reports that he does not use drugs. family history includes Alcohol abuse in his father; Melanoma (age of onset: 42) in his brother; Suicidality in his father. No Known Allergies   Review of Systems  Constitutional: Negative for activity change, appetite change and fever.  Respiratory: Negative for cough and shortness of breath.   Cardiovascular: Negative for chest pain and leg swelling.  Gastrointestinal: Negative for abdominal pain and vomiting.  Genitourinary: Negative for dysuria, flank pain and hematuria.  Musculoskeletal: Positive for back pain. Negative for joint swelling.  Neurological: Negative for weakness and numbness.       Objective:   Physical Exam  Constitutional:  He is oriented to person, place, and time. He appears well-developed and well-nourished. No distress.  Neck: No thyromegaly present.  Cardiovascular: Normal rate, regular rhythm and normal heart sounds.  No murmur heard. Pulmonary/Chest: Effort normal and breath sounds normal. No respiratory distress. He has no wheezes. He has no rales.  Musculoskeletal: He exhibits no edema.  Straight leg raises are negative bilaterally  Neurological: He is alert and oriented to person, place, and time. He has normal reflexes. No cranial nerve deficit.  Skin: No rash noted.       Assessment:     Right lower lumbar back pain. Suspect musculoskeletal    Plan:     -Short-term use of meloxicam 15 mg once daily. Reviewed potential side effects. -Reviewed some back stretches Heat or ice for symptom relief -Consider physical therapy if not improving next couple weeks-  Eulas Post MD [facility] at [facility]

## 2018-08-10 ENCOUNTER — Other Ambulatory Visit: Payer: Self-pay | Admitting: Family Medicine

## 2018-08-10 NOTE — Telephone Encounter (Signed)
Given at office visit 07/14/18 Short-term use of meloxicam 15 mg once daily. Reviewed potential side effects. Okay to refill?

## 2018-08-10 NOTE — Telephone Encounter (Signed)
Refill once OK. 

## 2018-09-21 ENCOUNTER — Other Ambulatory Visit: Payer: Self-pay | Admitting: Family Medicine

## 2018-09-21 DIAGNOSIS — L821 Other seborrheic keratosis: Secondary | ICD-10-CM | POA: Diagnosis not present

## 2018-09-21 DIAGNOSIS — Z85828 Personal history of other malignant neoplasm of skin: Secondary | ICD-10-CM | POA: Diagnosis not present

## 2018-09-21 DIAGNOSIS — L570 Actinic keratosis: Secondary | ICD-10-CM | POA: Diagnosis not present

## 2018-09-21 DIAGNOSIS — D1801 Hemangioma of skin and subcutaneous tissue: Secondary | ICD-10-CM | POA: Diagnosis not present

## 2018-09-21 DIAGNOSIS — L814 Other melanin hyperpigmentation: Secondary | ICD-10-CM | POA: Diagnosis not present

## 2018-09-21 DIAGNOSIS — L819 Disorder of pigmentation, unspecified: Secondary | ICD-10-CM | POA: Diagnosis not present

## 2018-09-21 DIAGNOSIS — D229 Melanocytic nevi, unspecified: Secondary | ICD-10-CM | POA: Diagnosis not present

## 2018-09-21 NOTE — Telephone Encounter (Signed)
Requested Prescriptions  Pending Prescriptions Disp Refills  . losartan (COZAAR) 50 MG tablet [Pharmacy Med Name: LOSARTAN POTASSIUM 50 MG TAB] 90 tablet 1    Sig: TAKE 1 TABLET BY MOUTH EVERY DAY     Cardiovascular:  Angiotensin Receptor Blockers Passed - 09/21/2018 10:47 AM      Passed - Cr in normal range and within 180 days    Creatinine, Ser  Date Value Ref Range Status  06/13/2018 0.86 0.40 - 1.50 mg/dL Final         Passed - K in normal range and within 180 days    Potassium  Date Value Ref Range Status  06/13/2018 4.7 3.5 - 5.1 mEq/L Final         Passed - Patient is not pregnant      Passed - Last BP in normal range    BP Readings from Last 1 Encounters:  07/14/18 130/88         Passed - Valid encounter within last 6 months    Recent Outpatient Visits          2 months ago Acute right-sided low back pain without sciatica   Therapist, music at [facility], Alinda Sierras, MD   3 months ago Essential hypertension   Therapist, music at [facility], Alinda Sierras, MD   3 months ago Cervical pain (neck)   Therapist, music at [facility], Alinda Sierras, MD   5 months ago Essential hypertension   Therapist, music at [facility], Alinda Sierras, MD   10 months ago [employer] of Stage manager at [facility], Alinda Sierras, MD           . omeprazole (PRILOSEC) 20 MG capsule [Pharmacy Med Name: OMEPRAZOLE DR 20 MG CAPSULE] 90 capsule 1    Sig: TAKE 1 [place]     Gastroenterology: Proton Pump Inhibitors Passed - 09/21/2018 10:47 AM      Passed - Valid encounter within last 12 months    Recent Outpatient Visits          2 months ago Acute right-sided low back pain without sciatica   Therapist, music at [facility], Alinda Sierras, MD   3 months ago Essential hypertension   Therapist, music at [facility], Alinda Sierras, MD   3 months ago Cervical pain (neck)   Therapist, music at [facility], Alinda Sierras, MD   5 months ago Essential hypertension   Therapist, music at [facility], Alinda Sierras, MD   10 months ago [employer] of Stage manager at [facility], Alinda Sierras, MD           . pravastatin (PRAVACHOL) 40 MG tablet [Pharmacy Med Name: PRAVASTATIN SODIUM 40 MG TAB] 90 tablet 1    Sig: TAKE 1 TABLET BY MOUTH EVERY DAY     Cardiovascular:  Antilipid - Statins Failed - 09/21/2018 10:47 AM      Failed - LDL in normal range and within 360 days    LDL Cholesterol  Date Value Ref Range Status  06/13/2018 114 (H) 0 - 99 mg/dL Final         Passed - Total Cholesterol in normal range and within 360 days    Cholesterol  Date Value Ref Range Status  06/13/2018 198 0 - 200 mg/dL Final    Comment:    ATP III Classification       Desirable:  < 200 mg/dL  Borderline High:  200 - 239 mg/dL          High:  > = 240 mg/dL         Passed - HDL in normal range and within 360 days    HDL  Date Value Ref Range Status  06/13/2018 59.40 >39.00 mg/dL Final         Passed - Triglycerides in normal range and within 360 days    Triglycerides  Date Value Ref Range Status  06/13/2018 123.0 0.0 - 149.0 mg/dL Final    Comment:    Normal:  <150 mg/dLBorderline High:  150 - 199 mg/dL         Passed - Patient is not pregnant      Passed - Valid encounter within last 12 months    Recent Outpatient Visits          2 months ago Acute right-sided low back pain without sciatica   Therapist, music at [facility], Alinda Sierras, MD   3 months ago Essential hypertension   Therapist, music at [facility], Alinda Sierras, MD   3 months ago Cervical pain (neck)   Therapist, music at [facility], Alinda Sierras, MD   5 months ago Essential hypertension   Therapist, music at [facility], Alinda Sierras, MD   10 months ago [employer] of Stage manager at [facility], Alinda Sierras, MD

## 2018-09-21 NOTE — Telephone Encounter (Signed)
Patient stated he only has enough medication to get through tomorrow.  He would like the request expedited.

## 2019-01-04 DIAGNOSIS — E291 Testicular hypofunction: Secondary | ICD-10-CM | POA: Diagnosis not present

## 2019-01-04 DIAGNOSIS — N400 Enlarged prostate without lower urinary tract symptoms: Secondary | ICD-10-CM | POA: Diagnosis not present

## 2019-01-04 DIAGNOSIS — R972 Elevated prostate specific antigen [PSA]: Secondary | ICD-10-CM | POA: Diagnosis not present

## 2019-01-08 ENCOUNTER — Telehealth: Payer: Self-pay | Admitting: *Deleted

## 2019-01-08 NOTE — Telephone Encounter (Signed)
Copied from [place] [id number]. Topic: Appointment Scheduling - Scheduling Inquiry for Clinic >> Jan 08, 2019  3:24 PM Scherrie Gerlach wrote: Reason for CRM: pt has an order from Dr Jeffie Pollock at [facility] for a testosterone, PSA, ,Prolactin PSH-LIT. Pt wants t know if he can come to [place] to have those labs? We did refer pt to [facility]

## 2019-01-08 NOTE — Telephone Encounter (Signed)
Please advise 

## 2019-01-08 NOTE — Telephone Encounter (Signed)
If they ordered them, should try to get at Urology if possible.  If not, would have to get order from Urology with ICD-10 codes.

## 2019-01-09 ENCOUNTER — Ambulatory Visit: Payer: Medicare HMO | Admitting: Internal Medicine

## 2019-01-09 ENCOUNTER — Encounter: Payer: Self-pay | Admitting: Internal Medicine

## 2019-01-09 DIAGNOSIS — K580 Irritable bowel syndrome with diarrhea: Secondary | ICD-10-CM

## 2019-01-09 DIAGNOSIS — K648 Other hemorrhoids: Secondary | ICD-10-CM

## 2019-01-09 DIAGNOSIS — R972 Elevated prostate specific antigen [PSA]: Secondary | ICD-10-CM | POA: Diagnosis not present

## 2019-01-09 DIAGNOSIS — E291 Testicular hypofunction: Secondary | ICD-10-CM | POA: Diagnosis not present

## 2019-01-09 DIAGNOSIS — K589 Irritable bowel syndrome without diarrhea: Secondary | ICD-10-CM | POA: Insufficient documentation

## 2019-01-09 LAB — PSA: PSA: 6.64

## 2019-01-09 NOTE — Assessment & Plan Note (Addendum)
Trial of loperamide, he can try out 1 tablet at bedtime and take 2 then and perhaps take some during the day as well up to 4 a day to see if that regulates things.  It is his recollection that he was able to use this in the past.  If that is the case he should be able to tolerate his canoe trip.  Another option in his case might be a bile salt binding agent.  Colestipol versus cholestyramine.  He is going to work with the loperamide and then contact me if that is not effective.

## 2019-01-09 NOTE — Patient Instructions (Signed)
Per Dr Carlean Purl try over the counter Imodium : take 1-2 tablets at bedtime, and you may add 1-2 tablets during the day as needed , up to 4 tablets a day.   Message Dr Carlean Purl with how your doing.    I appreciate the opportunity to care for you. Silvano Rusk, MD, [facility]

## 2019-01-09 NOTE — Assessment & Plan Note (Signed)
As before I do not think it makes sense to band these again unless his bowel habit situation is better controlled.  He understands and agrees.  It does not sound like they are as symptomatic as they were in the past.

## 2019-01-09 NOTE — Progress Notes (Signed)
AYCE PIETRZYK 68 y.o. 07/27/1951 [id number]  Assessment & Plan:   IBS (irritable bowel syndrome) Trial of loperamide, he can try out 1 tablet at bedtime and take 2 then and perhaps take some during the day as well up to 4 a day to see if that regulates things.  It is his recollection that he was able to use this in the past.  If that is the case he should be able to tolerate his canoe trip.  Another option in his case might be a bile salt binding agent.  Colestipol versus cholestyramine.  He is going to work with the loperamide and then contact me if that is not effective.   Hemorrhoids with complication As before I do not think it makes sense to band these again unless his bowel habit situation is better controlled.  He understands and agrees.  It does not sound like they are as symptomatic as they were in the past.   I appreciate the opportunity to care for this patient. CC: Eulas Post, MD   Subjective:   Chief Complaint: IBS  HPI The patient is here because of his irritable bowel syndrome which also aggravates his hemorrhoids.  For 10 or 15 years he has had frequent morning defecation 2-5 stools every morning, moving from solid to softer stools.  His hemorrhoids will swell get inflamed and he will be sore in the anal area at times.  Again this is chronic.  Previous colonoscopy, last done in 2015 by Dr. Earlean Shawl.  2 diminutive right colon polyps removed plan for recall 2020.  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  He still has problems though.  Xifaxan treatment last year did not seem to make much difference.  In talking to him he says he used to use Imodium when he travels, he used to go to [place] to visit factories for the [employer]).  He wonders if he did not get this IBS problem after an illness down there at one point.  Certainly possible as I told him.  [place] and knows that his defecation habits will be problematic and he would like some relief. No Known Allergies Current Meds  Medication Sig  . amLODipine (NORVASC) 10 MG tablet Take 1 tablet (10 mg total) by mouth daily.  . fluticasone (FLONASE) 50 MCG/ACT nasal spray USE 2 SPRAYS INTO BOTH NOSTRILS DAILY. (Patient taking differently: USE 2 SPRAYS INTO BOTH NOSTRILS DAILY. as needed)  . Glucosamine-Chondroit-Vit C-Mn TABS Take by mouth daily.   Marland Kitchen losartan (COZAAR) 50 MG tablet TAKE 1 TABLET BY MOUTH EVERY DAY  . Multiple Vitamins-Minerals (MENS MULTI VITAMIN & MINERAL PO) Take by mouth daily.    Marland Kitchen omeprazole (PRILOSEC) 20 MG capsule TAKE 1 CAPSULE BY MOUTH EVERY DAY  . pravastatin (PRAVACHOL) 40 MG tablet TAKE 1 TABLET BY MOUTH EVERY DAY  . vitamin C (ASCORBIC ACID) 500 MG tablet Take 500 mg by mouth daily.   Past Medical History:  Diagnosis Date  . Allergy   . Asthma   . Diverticulosis   . Elevated PSA   . GERD (gastroesophageal reflux disease)   . Hemorrhoids   . Hx of adenomatous colonic polyps   . Hyperlipidemia   . Hypertension   . Hypogonadism in male    Past Surgical History:  Procedure Laterality Date  . ANAL RECTAL MANOMETRY N/A  09/07/2017   Procedure: ANO RECTAL MANOMETRY;  Surgeon: Mauri Pole, MD;  Location: [facility];  Service: Endoscopy;  Laterality: N/A;  . COLONOSCOPY W/ BIOPSIES    . HEMORRHOID BANDING     Social History   Social History Narrative   Married, retired   Former smoker   + [place], no drugs   family history includes Alcohol abuse in his father; Melanoma (age of onset: 53) in his brother; Suicidality in his father.   Review of Systems As per HPI  Objective:   Physical Exam BP 124/90 (BP Location: Left Arm, Patient Position: Sitting, Cuff Size: Normal)   Pulse 96   Ht 5\' 9"  (1.753 m) Comment: height measured without shoes  Wt 182 lb 6 oz (82.7 kg)   BMI 26.93 kg/m  No acute distress  15 minutes time  spent with patient > half in counseling coordination of care

## 2019-01-09 NOTE — Telephone Encounter (Signed)
Called patient and gave him the message from Dr. Elease Hashimoto and patient stated that he will just go to [facility] for labs.

## 2019-01-19 ENCOUNTER — Encounter: Payer: Self-pay | Admitting: Family Medicine

## 2019-01-19 ENCOUNTER — Other Ambulatory Visit: Payer: Self-pay | Admitting: Urology

## 2019-01-19 DIAGNOSIS — R972 Elevated prostate specific antigen [PSA]: Secondary | ICD-10-CM

## 2019-01-30 ENCOUNTER — Ambulatory Visit: Payer: Medicare HMO | Admitting: Internal Medicine

## 2019-02-02 ENCOUNTER — Ambulatory Visit
Admission: RE | Admit: 2019-02-02 | Discharge: 2019-02-02 | Disposition: A | Discharge status: Home / Self Care | Payer: Medicare HMO | Source: Ambulatory Visit | Attending: Urology | Admitting: Urology

## 2019-02-02 DIAGNOSIS — R972 Elevated prostate specific antigen [PSA]: Secondary | ICD-10-CM

## 2019-02-02 MED ORDER — GADOBENATE DIMEGLUMINE 529 MG/ML IV SOLN
17.0000 mL | Freq: Once | INTRAVENOUS | Status: AC | PRN
  Administered 2019-02-02: 17 mL via INTRAVENOUS

## 2019-02-21 ENCOUNTER — Encounter: Payer: Self-pay | Admitting: Family Medicine

## 2019-03-06 ENCOUNTER — Encounter: Payer: Self-pay | Admitting: Family Medicine

## 2019-03-13 ENCOUNTER — Other Ambulatory Visit: Payer: Self-pay | Admitting: Family Medicine

## 2019-03-17 ENCOUNTER — Other Ambulatory Visit: Payer: Self-pay | Admitting: Family Medicine

## 2019-03-18 ENCOUNTER — Other Ambulatory Visit: Payer: Self-pay | Admitting: Family Medicine

## 2019-03-19 ENCOUNTER — Encounter: Payer: Self-pay | Admitting: Family Medicine

## 2019-04-09 ENCOUNTER — Encounter: Payer: Self-pay | Admitting: Family Medicine

## 2019-04-10 ENCOUNTER — Ambulatory Visit (INDEPENDENT_AMBULATORY_CARE_PROVIDER_SITE_OTHER): Payer: Medicare HMO | Admitting: Family Medicine

## 2019-04-10 ENCOUNTER — Other Ambulatory Visit: Payer: Self-pay

## 2019-04-10 DIAGNOSIS — I10 Essential (primary) hypertension: Secondary | ICD-10-CM | POA: Diagnosis not present

## 2019-04-10 DIAGNOSIS — R7989 Other specified abnormal findings of blood chemistry: Secondary | ICD-10-CM | POA: Diagnosis not present

## 2019-04-10 DIAGNOSIS — E785 Hyperlipidemia, unspecified: Secondary | ICD-10-CM | POA: Diagnosis not present

## 2019-04-10 DIAGNOSIS — R7303 Prediabetes: Secondary | ICD-10-CM

## 2019-04-10 MED ORDER — TESTOSTERONE 20.25 MG/ACT (1.62%) TD GEL: TRANSDERMAL | 5 refills | Status: DC

## 2019-04-10 NOTE — Progress Notes (Signed)
Patient ID: Daniel Sullivan, male   DOB: May 31, 1951, 68 y.o.   MRN: [id number]  This visit type was conducted due to national recommendations for restrictions regarding the COVID-19 pandemic in an effort to limit this patient's exposure and mitigate transmission in our community.   Virtual Visit via Video Note  I connected with Daniel Sullivan") Imperato on 04/10/19 at  2:30 PM EDT by a video enabled telemedicine application and verified that I am speaking with the correct person using two identifiers.  Location patient: home Location provider:work or home office Persons participating in the virtual visit: patient, provider  I discussed the limitations of evaluation and management by telemedicine and the availability of in person appointments. The patient expressed understanding and agreed to proceed.   HPI: Chronic problems include history of hypertension, GERD, low testosterone, hyperlipidemia, BPH, prediabetes.  Patient had slightly increased PSA last year.  He had requested going back on testosterone replacement.  We had requested clearance from urology first.  He saw urologist back over the wintertime.  He had MRI of the prostate on 02/02/2019 and this showed no evidence for any carcinoma changes.  Recent PSA per urology 6.64.  He had prolactin levels as well as FSH and LH which were normal.  He was given clearance to start back testosterone replacement.  Total testosterone level through urology 251.  He has had multiple prior low levels here.  He had biopsy the prostate back in 2014 which was negative for cancer.  Denies obstructive urinary symptoms now.  He has increased lethargy and fatigue though he does manage sometimes to ride around 30 miles per day on his bike.  Previously took topical testosterone and would like to try AndroGel pump spray if covered by his insurance.  He is on pravastatin for hyperlipidemia.  He will be due for lipids soon.    He has hypertension treated with amlodipine and  losartan.  His blood pressures been stable. History of prediabetes range blood sugars.  No recent polyuria or polydipsia.  Last A1c was low 6 range   ROS: See pertinent positives and negatives per HPI.  Past Medical History:  Diagnosis Date  . Allergy   . Asthma   . Diverticulosis   . Elevated PSA   . GERD (gastroesophageal reflux disease)   . Hemorrhoids   . Hx of adenomatous colonic polyps   . Hyperlipidemia   . Hypertension   . Hypogonadism in male     Past Surgical History:  Procedure Laterality Date  . ANAL RECTAL MANOMETRY N/A 09/07/2017   Procedure: ANO RECTAL MANOMETRY;  Surgeon: Mauri Pole, MD;  Location: [facility];  Service: Endoscopy;  Laterality: N/A;  . COLONOSCOPY W/ BIOPSIES    . HEMORRHOID BANDING      Family History  Problem Relation Age of Onset  . Alcohol abuse Father   . Suicidality Father   . Melanoma Brother 28  . Stomach cancer Neg Hx   . Colon cancer Neg Hx     SOCIAL HX: Non-smoker   Current Outpatient Medications:  .  amLODipine (NORVASC) 10 MG tablet, TAKE 1 TABLET BY MOUTH EVERY DAY, Disp: 90 tablet, Rfl: 0 .  fluticasone (FLONASE) 50 MCG/ACT nasal spray, USE 2 SPRAYS INTO BOTH NOSTRILS DAILY. (Patient taking differently: USE 2 SPRAYS INTO BOTH NOSTRILS DAILY. as needed), Disp: 16 g, Rfl: 0 .  Glucosamine-Chondroit-Vit C-Mn TABS, Take by mouth daily. , Disp: , Rfl:  .  losartan (COZAAR) 50 MG tablet, TAKE 1 TABLET  BY MOUTH EVERY DAY, Disp: 90 tablet, Rfl: 0 .  meloxicam (MOBIC) 15 MG tablet, TAKE 1 TABLET BY MOUTH EVERY DAY (Patient not taking: Reported on 01/09/2019), Disp: 30 tablet, Rfl: 0 .  Multiple Vitamins-Minerals (MENS MULTI VITAMIN & MINERAL PO), Take by mouth daily.  , Disp: , Rfl:  .  omeprazole (PRILOSEC) 20 MG capsule, TAKE 1 CAPSULE BY MOUTH EVERY DAY, Disp: 90 capsule, Rfl: 1 .  pravastatin (PRAVACHOL) 40 MG tablet, TAKE 1 TABLET BY MOUTH EVERY DAY, Disp: 90 tablet, Rfl: 0 .  Testosterone 20.25 MG/ACT (1.62%) GEL,  One pump spray per arm once daily, Disp: 75 g, Rfl: 5 .  vitamin C (ASCORBIC ACID) 500 MG tablet, Take 500 mg by mouth daily., Disp: , Rfl:   EXAM:  VITALS per patient if applicable:  GENERAL: alert, oriented, appears well and in no acute distress  HEENT: atraumatic, conjunttiva clear, no obvious abnormalities on inspection of external nose and ears  NECK: normal movements of the head and neck  LUNGS: on inspection no signs of respiratory distress, breathing rate appears normal, no obvious gross SOB, gasping or wheezing  CV: no obvious cyanosis  MS: moves all visible extremities without noticeable abnormality  PSYCH/NEURO: pleasant and cooperative, no obvious depression or anxiety, speech and thought processing grossly intact  ASSESSMENT AND PLAN:  Discussed the following assessment and plan:  #1 low testosterone.  He has history of elevated PSA but previous negative biopsies and recent MRI of the prostate not revealing evidence for cancer.  He has been given clearance by urology to consider testosterone replacement -Start AndroGel pump spray 1 spray per arm once daily -Future labs with total testosterone and CBC in 3 months  #2 hypertension stable -Continue losartan and amlodipine.  Check basic metabolic panel at 73-month follow-up  #3 history of prediabetes.  No polyuria or polydipsia -Check A1c at 16-month follow-up labs  #4 dyslipidemia -Continue pravastatin check lipid and hepatic panel in 3 months     I discussed the assessment and treatment plan with the patient. The patient was provided an opportunity to ask questions and all were answered. The patient agreed with the plan and demonstrated an understanding of the instructions.   The patient was advised to call back or seek an in-person evaluation if the symptoms worsen or if the condition fails to improve as anticipated.    Carolann Littler, MD

## 2019-04-12 ENCOUNTER — Telehealth: Payer: Self-pay

## 2019-04-12 NOTE — Telephone Encounter (Signed)
PA for Testosterone 20.25 MG/ACT (1.62%) GEL has been sent to cover my meds.  KeyTrena Platt - PA Case ID: [id number] - Rx #: [id number]

## 2019-04-13 NOTE — Telephone Encounter (Signed)
Spoke with [employer] Additional info has been given. Awaiting response.

## 2019-04-13 NOTE — Telephone Encounter (Signed)
Christy, from [place], called and is requesting more information in order to complete the prior authorization.   Callback # [phone] Reference # [id number]

## 2019-04-24 NOTE — Telephone Encounter (Signed)
Testosterone rx has been denied by the insurance.  Paper work has been given the provider.  Please return to [place].  Thanks

## 2019-05-02 NOTE — Telephone Encounter (Signed)
Let him know his insurance is refusing to cover the testosterone.  I don't know of any other recourse.  Their letter of denial states that they are not covering age-related hypogonadism.  He, of course, does have option of paying out of pocket for medication.  Injectables are less expensive than topicals- but cost may still be prohibitive.

## 2019-06-07 DIAGNOSIS — L570 Actinic keratosis: Secondary | ICD-10-CM | POA: Diagnosis not present

## 2019-06-07 DIAGNOSIS — D225 Melanocytic nevi of trunk: Secondary | ICD-10-CM | POA: Diagnosis not present

## 2019-06-07 DIAGNOSIS — L821 Other seborrheic keratosis: Secondary | ICD-10-CM | POA: Diagnosis not present

## 2019-06-07 DIAGNOSIS — Z85828 Personal history of other malignant neoplasm of skin: Secondary | ICD-10-CM | POA: Diagnosis not present

## 2019-06-07 DIAGNOSIS — D485 Neoplasm of uncertain behavior of skin: Secondary | ICD-10-CM | POA: Diagnosis not present

## 2019-06-07 DIAGNOSIS — D1801 Hemangioma of skin and subcutaneous tissue: Secondary | ICD-10-CM | POA: Diagnosis not present

## 2019-06-07 DIAGNOSIS — L814 Other melanin hyperpigmentation: Secondary | ICD-10-CM | POA: Diagnosis not present

## 2019-06-13 ENCOUNTER — Other Ambulatory Visit: Payer: Self-pay | Admitting: Family Medicine

## 2019-06-20 ENCOUNTER — Other Ambulatory Visit: Payer: Self-pay | Admitting: Family Medicine

## 2019-06-21 ENCOUNTER — Encounter: Payer: Self-pay | Admitting: Family Medicine

## 2019-06-22 ENCOUNTER — Ambulatory Visit (INDEPENDENT_AMBULATORY_CARE_PROVIDER_SITE_OTHER): Payer: Medicare HMO | Admitting: Family Medicine

## 2019-06-22 ENCOUNTER — Other Ambulatory Visit: Payer: Self-pay

## 2019-06-22 DIAGNOSIS — Z20828 Contact with and (suspected) exposure to other viral communicable diseases: Secondary | ICD-10-CM

## 2019-06-22 DIAGNOSIS — R5383 Other fatigue: Secondary | ICD-10-CM

## 2019-06-22 NOTE — Progress Notes (Signed)
Patient ID: Daniel Sullivan, male   DOB: 12-Apr-1951, 68 y.o.   MRN: [id number]  This visit type was conducted due to national recommendations for restrictions regarding the COVID-19 pandemic in an effort to limit this patient's exposure and mitigate transmission in our community.   Virtual Visit via Video Note  I connected with Daniel Sullivan on 06/22/19 at  4:00 PM EDT by a video enabled telemedicine application and verified that I am speaking with the correct person using two identifiers.  Location patient: home Location provider:work or home office Persons participating in the virtual visit: patient, provider  I discussed the limitations of evaluation and management by telemedicine and the availability of in person appointments. The patient expressed understanding and agreed to proceed.   HPI: Patient had called earlier with complaints of feeling fatigued and "exhausted ".  He will make sure this was not some kind of viral process.  He states back around July 4 he rode over 30 miles on a bike with temperatures in the high 80s and felt very fatigued afterwards.  Then played golf July 6 and felt very hot and weak.  He felt he had case of heat exhaustion that day.  He then played golf again this past Monday and felt very hot and weak all over and symptoms of lightheadedness, muscle achiness and occasional chill.  No documented fever.  He denies any cough, loss of taste or smell, diarrhea, sore throat.  No known sick exposures.  He has had heat exhaustion previously   ROS: See pertinent positives and negatives per HPI.  Past Medical History:  Diagnosis Date  . Allergy   . Asthma   . Diverticulosis   . Elevated PSA   . GERD (gastroesophageal reflux disease)   . Hemorrhoids   . Hx of adenomatous colonic polyps   . Hyperlipidemia   . Hypertension   . Hypogonadism in male     Past Surgical History:  Procedure Laterality Date  . ANAL RECTAL MANOMETRY N/A 09/07/2017   Procedure: ANO RECTAL  MANOMETRY;  Surgeon: Mauri Pole, MD;  Location: [facility];  Service: Endoscopy;  Laterality: N/A;  . COLONOSCOPY W/ BIOPSIES    . HEMORRHOID BANDING      Family History  Problem Relation Age of Onset  . Alcohol abuse Father   . Suicidality Father   . Melanoma Brother 52  . Stomach cancer Neg Hx   . Colon cancer Neg Hx     SOCIAL HX: Non-smoker   Current Outpatient Medications:  .  amLODipine (NORVASC) 10 MG tablet, TAKE 1 TABLET BY MOUTH EVERY DAY, Disp: 90 tablet, Rfl: 0 .  fluticasone (FLONASE) 50 MCG/ACT nasal spray, USE 2 SPRAYS INTO BOTH NOSTRILS DAILY. (Patient taking differently: USE 2 SPRAYS INTO BOTH NOSTRILS DAILY. as needed), Disp: 16 g, Rfl: 0 .  Glucosamine-Chondroit-Vit C-Mn TABS, Take by mouth daily. , Disp: , Rfl:  .  losartan (COZAAR) 50 MG tablet, TAKE 1 TABLET BY MOUTH EVERY DAY, Disp: 90 tablet, Rfl: 0 .  meloxicam (MOBIC) 15 MG tablet, TAKE 1 TABLET BY MOUTH EVERY DAY (Patient not taking: Reported on 01/09/2019), Disp: 30 tablet, Rfl: 0 .  Multiple Vitamins-Minerals (MENS MULTI VITAMIN & MINERAL PO), Take by mouth daily.  , Disp: , Rfl:  .  omeprazole (PRILOSEC) 20 MG capsule, TAKE 1 CAPSULE BY MOUTH EVERY DAY, Disp: 90 capsule, Rfl: 1 .  pravastatin (PRAVACHOL) 40 MG tablet, TAKE 1 TABLET BY MOUTH EVERY DAY, Disp: 90 tablet, Rfl: 0 .  Testosterone 20.25 MG/ACT (1.62%) GEL, One pump spray per arm once daily, Disp: 75 g, Rfl: 5 .  vitamin C (ASCORBIC ACID) 500 MG tablet, Take 500 mg by mouth daily., Disp: , Rfl:   EXAM:  VITALS per patient if applicable:  GENERAL: alert, oriented, appears well and in no acute distress  HEENT: atraumatic, conjunttiva clear, no obvious abnormalities on inspection of external nose and ears  NECK: normal movements of the head and neck  LUNGS: on inspection no signs of respiratory distress, breathing rate appears normal, no obvious gross SOB, gasping or wheezing  CV: no obvious cyanosis  MS: moves all visible  extremities without noticeable abnormality  PSYCH/NEURO: pleasant and cooperative, no obvious depression or anxiety, speech and thought processing grossly intact  ASSESSMENT AND PLAN:  Discussed the following assessment and plan:  Fatigue, unspecified type-suspect related to heat exhaustion type symptoms.  Patient states his wife is very debilitated and he wants to make sure he does not have COVID.  -We will set up COVID testing -We discussed heat illness in some detail.  We discussed preventative measures.  We also discussed the fact the people who have had severe heat illness previously are more prone to future events of heat illness    I discussed the assessment and treatment plan with the patient. The patient was provided an opportunity to ask questions and all were answered. The patient agreed with the plan and demonstrated an understanding of the instructions.   The patient was advised to call back or seek an in-person evaluation if the symptoms worsen or if the condition fails to improve as anticipated.   Carolann Littler, MD

## 2019-06-24 ENCOUNTER — Other Ambulatory Visit: Payer: Self-pay | Admitting: Family Medicine

## 2019-06-25 ENCOUNTER — Telehealth: Payer: Self-pay

## 2019-06-25 ENCOUNTER — Other Ambulatory Visit: Payer: Self-pay

## 2019-06-25 DIAGNOSIS — R5383 Other fatigue: Secondary | ICD-10-CM

## 2019-06-25 NOTE — Telephone Encounter (Signed)
Called patient to see if he had a Covid test over the weekend and had to leave a detailed voice message for him to call back to see if he still needs to have done and for him to find out where the testing sites are located.  CRM Created.

## 2019-06-26 ENCOUNTER — Other Ambulatory Visit: Payer: Self-pay

## 2019-06-26 DIAGNOSIS — Z20822 Contact with and (suspected) exposure to covid-19: Secondary | ICD-10-CM

## 2019-06-26 DIAGNOSIS — Z20828 Contact with and (suspected) exposure to other viral communicable diseases: Secondary | ICD-10-CM

## 2019-06-26 NOTE — Progress Notes (Signed)
covid

## 2019-06-28 ENCOUNTER — Encounter: Payer: Self-pay | Admitting: Family Medicine

## 2019-06-28 LAB — NOVEL CORONAVIRUS, NAA: SARS-CoV-2, NAA: NOT DETECTED

## 2019-07-07 DEATH — deceased

## 2019-07-20 ENCOUNTER — Other Ambulatory Visit: Payer: Self-pay | Admitting: Family Medicine

## 2019-08-02 ENCOUNTER — Encounter: Payer: Self-pay | Admitting: Family Medicine

## 2019-08-06 ENCOUNTER — Encounter: Payer: Self-pay | Admitting: Internal Medicine

## 2019-09-10 ENCOUNTER — Other Ambulatory Visit: Payer: Self-pay | Admitting: Family Medicine

## 2019-09-13 ENCOUNTER — Other Ambulatory Visit: Payer: Self-pay | Admitting: Family Medicine

## 2019-10-07 DEATH — deceased

## 2019-11-13 DIAGNOSIS — L570 Actinic keratosis: Secondary | ICD-10-CM | POA: Diagnosis not present

## 2019-11-13 DIAGNOSIS — R208 Other disturbances of skin sensation: Secondary | ICD-10-CM | POA: Diagnosis not present

## 2019-11-13 DIAGNOSIS — D225 Melanocytic nevi of trunk: Secondary | ICD-10-CM | POA: Diagnosis not present

## 2019-11-13 DIAGNOSIS — L309 Dermatitis, unspecified: Secondary | ICD-10-CM | POA: Diagnosis not present

## 2019-11-13 DIAGNOSIS — L820 Inflamed seborrheic keratosis: Secondary | ICD-10-CM | POA: Diagnosis not present

## 2019-11-13 DIAGNOSIS — L814 Other melanin hyperpigmentation: Secondary | ICD-10-CM | POA: Diagnosis not present

## 2019-11-13 DIAGNOSIS — L821 Other seborrheic keratosis: Secondary | ICD-10-CM | POA: Diagnosis not present

## 2019-11-17 DIAGNOSIS — Z20828 Contact with and (suspected) exposure to other viral communicable diseases: Secondary | ICD-10-CM | POA: Diagnosis not present

## 2019-11-27 ENCOUNTER — Ambulatory Visit (INDEPENDENT_AMBULATORY_CARE_PROVIDER_SITE_OTHER): Payer: Medicare HMO | Admitting: Family Medicine

## 2019-11-27 ENCOUNTER — Encounter: Payer: Self-pay | Admitting: Family Medicine

## 2019-11-27 ENCOUNTER — Telehealth: Payer: Self-pay

## 2019-11-27 ENCOUNTER — Other Ambulatory Visit: Payer: Self-pay

## 2019-11-27 VITALS — BP 130/88 | HR 89 | Temp 98.1°F | Ht 69.0 in | Wt 183.1 lb

## 2019-11-27 DIAGNOSIS — R202 Paresthesia of skin: Secondary | ICD-10-CM

## 2019-11-27 DIAGNOSIS — M542 Cervicalgia: Secondary | ICD-10-CM | POA: Diagnosis not present

## 2019-11-27 DIAGNOSIS — M545 Low back pain, unspecified: Secondary | ICD-10-CM

## 2019-11-27 NOTE — Telephone Encounter (Signed)
Called and left a detailed voice message for patient to call us back and schedule an office visit per his MyChart message.   OK for [facility] to schedule and discuss and advise patient.  CRM Created.

## 2019-11-27 NOTE — Progress Notes (Signed)
Subjective:     Patient ID: BERTRAN GIRARD, male   DOB: Jun 15, 1951, 68 y.o.   MRN: [id number]  HPI Mr. Borst is here with complaint of some low back pain and neck pain pretty much daily for at least a year.  He is taken ibuprofen and meloxicam in the past with some relief.  He states his neck pain and back pain are specially noted with things like golfing.  He generally does some cycling but not recently.  He has had occasional fleeting sharp pain lasting just seconds in both upper extremities.  No upper extremity weakness or persistent numbness.  He also relates intermittent tingling sensation in both feet.  This has been going on for some time.  Symptoms are very intermittent.  No associated pain.  He was screened for B12 deficiency back in 2015 and level was 267.  No history of diabetes.  Denies any recent polyuria or polydipsia  Social history is that his wife died of some heart disease complications back in October.  He has been [place] much of his time [place] the beach and is enjoying the peace and seclusion of being there.  Past Medical History:  Diagnosis Date  . Allergy   . Asthma   . Diverticulosis   . Elevated PSA   . GERD (gastroesophageal reflux disease)   . Hemorrhoids   . Hx of adenomatous colonic polyps   . Hyperlipidemia   . Hypertension   . Hypogonadism in male    Past Surgical History:  Procedure Laterality Date  . ANAL RECTAL MANOMETRY N/A 09/07/2017   Procedure: ANO RECTAL MANOMETRY;  Surgeon: Mauri Pole, MD;  Location: [facility];  Service: Endoscopy;  Laterality: N/A;  . COLONOSCOPY W/ BIOPSIES    . HEMORRHOID BANDING      reports that he quit smoking about 31 years ago. His smoking use included cigarettes. He has a 7.50 pack-year smoking history. He has never used smokeless tobacco. He reports current alcohol use. He reports that he does not use drugs. family history includes Alcohol abuse in his father; Melanoma (age of onset: 46) in his brother;  Suicidality in his father. No Known Allergies   Review of Systems  Constitutional: Negative for chills and fever.  Cardiovascular: Negative for chest pain.  Musculoskeletal: Positive for back pain and neck pain.  Neurological: Positive for numbness. Negative for weakness.       Objective:   Physical Exam Vitals reviewed.  Constitutional:      Appearance: Normal appearance.  Neck:     Comments: He has good range of motion with flexion of the neck and some limitation of extension and lateral bending to the right and left side Cardiovascular:     Rate and Rhythm: Normal rate and regular rhythm.  Pulmonary:     Effort: Pulmonary effort is normal.     Breath sounds: Normal breath sounds.  Musculoskeletal:     Right lower leg: No edema.     Left lower leg: No edema.  Neurological:     General: No focal deficit present.     Mental Status: He is alert.     Cranial Nerves: No cranial nerve deficit.     Comments: 1+ reflexes throughout upper extremities.  Full strength throughout        Assessment:     #1 chronic cervical and lumbar back pain.  Suspect degenerative spondylosis.  No focal neurologic symptoms  #2 intermittent paresthesias involving the feet    Plan:     -  We discussed cautions with chronic nonsteroidal use- especially at his age. -Discussed possible physical therapy referral for his chronic neck and low back pain but he declines at this time.  He will let us know if this is progressing -We discussed possible further lab evaluation regarding his intermittent paresthesias.  He wishes to wait until physical at this point.  If symptoms progressing follow-up sooner for repeat A1c, B12, TSH, electrolytes, and serum protein electrophoresis  Eulas Post MD [facility] at [facility]

## 2019-11-27 NOTE — Patient Instructions (Signed)
Paresthesia Paresthesia is an abnormal burning or prickling sensation. It is usually felt in the hands, arms, legs, or feet. However, it may occur in any part of the body. Usually, paresthesia is not painful. It may feel like:  Tingling or numbness.  Buzzing.  Itching. Paresthesia may occur without any clear cause, or it may be caused by:  Breathing too quickly (hyperventilation).  Pressure on a nerve.  An underlying medical condition.  Side effects of a medication.  Nutritional deficiencies.  Exposure to toxic chemicals. Most people experience temporary (transient) paresthesia at some time in their lives. For some people, it may be long-lasting (chronic) because of an underlying medical condition. If you have paresthesia that lasts a long time, you may need to be evaluated by your health care provider. Follow these instructions at home: Alcohol use   Do not drink alcohol if: ? Your health care provider tells you not to drink. ? You are pregnant, may be pregnant, or are planning to become pregnant.  If you drink alcohol: ? Limit how much you use to:  0-1 drink a day for women.  0-2 drinks a day for men. ? Be aware of how much alcohol is in your drink. In the [place] one drink equals one 12 oz bottle of beer (355 mL), one 5 oz glass of wine (148 mL), or one 1 oz glass of hard liquor (44 mL). Nutrition   Eat a healthy diet. This includes: ? Eating foods that are high in fiber, such as fresh fruits and vegetables, whole grains, and beans. ? Limiting foods that are high in fat and processed sugars, such as fried or sweet foods. General instructions  Take over-the-counter and prescription medicines only as told by your health care provider.  Do not use any products that contain nicotine or tobacco, such as cigarettes and e-cigarettes. These can keep blood from reaching damaged nerves. If you need help quitting, ask your health care provider.  If you have diabetes, work  closely with your health care provider to keep your blood sugar under control.  If you have numbness in your feet: ? Check every day for signs of injury or infection. Watch for redness, warmth, and swelling. ? Wear padded socks and comfortable shoes. These help protect your feet.  Keep all follow-up visits as told by your health care provider. This is important. Contact a health care provider if you:  Have paresthesia that gets worse or does not go away.  Have a burning or prickling feeling that gets worse when you walk.  Have pain, cramps, or dizziness.  Develop a rash. Get help right away if you:  Feel weak.  Have trouble walking or moving.  Have problems with speech, understanding, or vision.  Feel confused.  Cannot control your bladder or bowel movements.  Have numbness after an injury.  Develop new weakness in an arm or leg.  Faint. Summary  Paresthesia is an abnormal burning or prickling sensation that is usually felt in the hands, arms, legs, or feet. It may also occur in other parts of the body.  Paresthesia may occur without any clear cause, or it may be caused by breathing too quickly (hyperventilation), pressure on a nerve, an underlying medical condition, side effects of a medication, nutritional deficiencies, or exposure to toxic chemicals.  If you have paresthesia that lasts a long time, you may need to be evaluated by your health care provider. This information is not intended to replace advice given to you by   your health care provider. Make sure you discuss any questions you have with your health care provider. Document Released: 11/12/2002 Document Revised: 12/18/2018 Document Reviewed: 12/01/2017 Elsevier Patient Education  2020 Elsevier Inc[employer]  

## 2019-11-28 ENCOUNTER — Other Ambulatory Visit: Payer: Self-pay | Admitting: Family Medicine

## 2019-12-04 ENCOUNTER — Encounter: Payer: Self-pay | Admitting: Family Medicine

## 2019-12-04 ENCOUNTER — Ambulatory Visit (INDEPENDENT_AMBULATORY_CARE_PROVIDER_SITE_OTHER): Payer: Medicare HMO | Admitting: Family Medicine

## 2019-12-04 ENCOUNTER — Other Ambulatory Visit: Payer: Self-pay

## 2019-12-04 VITALS — BP 132/86 | HR 86 | Temp 97.9°F | Ht 68.75 in | Wt 182.0 lb

## 2019-12-04 DIAGNOSIS — Z0000 Encounter for general adult medical examination without abnormal findings: Secondary | ICD-10-CM | POA: Diagnosis not present

## 2019-12-04 LAB — HEPATIC FUNCTION PANEL
ALT: 19 U/L (ref 0–53)
AST: 26 U/L (ref 0–37)
Albumin: 4.4 g/dL (ref 3.5–5.2)
Alkaline Phosphatase: 57 U/L (ref 39–117)
Bilirubin, Direct: 0.1 mg/dL (ref 0.0–0.3)
Total Bilirubin: 0.5 mg/dL (ref 0.2–1.2)
Total Protein: 7 g/dL (ref 6.0–8.3)

## 2019-12-04 LAB — CBC WITH DIFFERENTIAL/PLATELET
Basophils Absolute: 0 10*3/uL (ref 0.0–0.1)
Basophils Relative: 0.5 % (ref 0.0–3.0)
Eosinophils Absolute: 0.2 10*3/uL (ref 0.0–0.7)
Eosinophils Relative: 3.1 % (ref 0.0–5.0)
HCT: 40.2 % (ref 39.0–52.0)
Hemoglobin: 13.4 g/dL (ref 13.0–17.0)
Lymphocytes Relative: 15.2 % (ref 12.0–46.0)
Lymphs Abs: 1.1 10*3/uL (ref 0.7–4.0)
MCHC: 33.3 g/dL (ref 30.0–36.0)
MCV: 95 fl (ref 78.0–100.0)
Monocytes Absolute: 0.8 10*3/uL (ref 0.1–1.0)
Monocytes Relative: 11.6 % (ref 3.0–12.0)
Neutro Abs: 5.1 10*3/uL (ref 1.4–7.7)
Neutrophils Relative %: 69.6 % (ref 43.0–77.0)
Platelets: 184 10*3/uL (ref 150.0–400.0)
RBC: 4.24 Mil/uL (ref 4.22–5.81)
RDW: 13.3 % (ref 11.5–15.5)
WBC: 7.3 10*3/uL (ref 4.0–10.5)

## 2019-12-04 LAB — LIPID PANEL
Cholesterol: 201 mg/dL — ABNORMAL HIGH (ref 0–200)
HDL: 58.7 mg/dL (ref >39.00)
LDL Cholesterol: 121 mg/dL — ABNORMAL HIGH (ref 0–99)
NonHDL: 142.1
Total CHOL/HDL Ratio: 3
Triglycerides: 104 mg/dL (ref 0.0–149.0)
VLDL: 20.8 mg/dL (ref 0.0–40.0)

## 2019-12-04 LAB — BASIC METABOLIC PANEL
BUN: 14 mg/dL (ref 6–23)
CO2: 32 mEq/L (ref 19–32)
Calcium: 9.1 mg/dL (ref 8.4–10.5)
Chloride: 100 mEq/L (ref 96–112)
Creatinine, Ser: 0.82 mg/dL (ref 0.40–1.50)
GFR: 92.94 mL/min (ref >60.00)
Glucose, Bld: 121 mg/dL — ABNORMAL HIGH (ref 70–99)
Potassium: 4.5 mEq/L (ref 3.5–5.1)
Sodium: 138 mEq/L (ref 135–145)

## 2019-12-04 LAB — TSH: TSH: 2.01 u[IU]/mL (ref 0.35–4.50)

## 2019-12-04 LAB — PSA: PSA: 5.02 ng/mL — ABNORMAL HIGH (ref 0.10–4.00)

## 2019-12-04 NOTE — Progress Notes (Signed)
Subjective:     Patient ID: Daniel Sullivan, male   DOB: [birth year], 68 y.o.   MRN: [id number]  HPI  Daniel Sullivan is seen for physical exam.  Daniel Sullivan has history of irritable bowel syndrome, hypertension, GERD, hyperlipidemia, low testosterone.  Daniel Sullivan has had a struggle getting his testosterone covered by insurance and is currently not on replacement.  Daniel Sullivan is still exercising fairly regularly.  Daniel Sullivan ran out of losartan over a week ago.  His blood pressures actually been fairly stable off losartan thus far.  Daniel Sullivan would like to observe for now off losartan to see if this remains well controlled.  Maintenance reviewed:   -Pneumonia vaccines up-to-date -Flu vaccine given 07/26/2019 -Previous hepatitis C screen negative -Has received previous Shingrix vaccine -Tetanus due 2021 -Colonoscopy due 08/13/2024  Social history is that his wife passed away few months ago from recent prolonged illness.  Daniel Sullivan seemed to be coping fairly well.  Daniel Sullivan has a place at [employer] and goes there frequently.  Past Medical History:  Diagnosis Date  . Allergy   . Asthma   . Diverticulosis   . Elevated PSA   . GERD (gastroesophageal reflux disease)   . Hemorrhoids   . Hx of adenomatous colonic polyps   . Hyperlipidemia   . Hypertension   . Hypogonadism in male    Past Surgical History:  Procedure Laterality Date  . ANAL RECTAL MANOMETRY N/A 09/07/2017   Procedure: ANO RECTAL MANOMETRY;  Surgeon: Mauri Pole, MD;  Location: [facility];  Service: Endoscopy;  Laterality: N/A;  . COLONOSCOPY W/ BIOPSIES    . HEMORRHOID BANDING      reports that Daniel Sullivan quit smoking about 31 years ago. His smoking use included cigarettes. Daniel Sullivan has a 7.50 pack-year smoking history. Daniel Sullivan has never used smokeless tobacco. Daniel Sullivan reports current alcohol use. Daniel Sullivan reports that Daniel Sullivan does not use drugs. family history includes Alcohol abuse in his father; Melanoma (age of onset: 15) in his brother; Suicidality in his father. No Known Allergies   Review of  Systems  Constitutional: Negative for activity change, appetite change, fatigue and fever.  HENT: Negative for congestion, ear pain and trouble swallowing.   Eyes: Negative for pain and visual disturbance.  Respiratory: Negative for cough, shortness of breath and wheezing.   Cardiovascular: Negative for chest pain and palpitations.  Gastrointestinal: Negative for abdominal distention, abdominal pain, blood in stool, constipation, diarrhea, nausea, rectal pain and vomiting.  Genitourinary: Negative for dysuria, hematuria and testicular pain.  Musculoskeletal: Negative for arthralgias and joint swelling.  Skin: Negative for rash.  Neurological: Negative for dizziness, syncope and headaches.  Hematological: Negative for adenopathy.  Psychiatric/Behavioral: Negative for confusion and dysphoric mood.       Objective:   Physical Exam Constitutional:      General: Daniel Sullivan is not in acute distress.    Appearance: Daniel Sullivan is well-developed.  HENT:     Head: Normocephalic and atraumatic.     Ears:     Comments: Daniel Sullivan has cerumen in both canals and this was removed without difficulty with curette Eyes:     Conjunctiva/sclera: Conjunctivae normal.     Pupils: Pupils are equal, round, and reactive to light.  Neck:     Thyroid: No thyromegaly.  Cardiovascular:     Rate and Rhythm: Normal rate and regular rhythm.     Heart sounds: Normal heart sounds. No murmur.  Pulmonary:     Effort: No respiratory distress.     Breath sounds: No wheezing  or rales.  Abdominal:     General: Bowel sounds are normal. There is no distension.     Palpations: Abdomen is soft. There is no mass.     Tenderness: There is no abdominal tenderness. There is no guarding or rebound.  Musculoskeletal:     Cervical back: Normal range of motion and neck supple.  Lymphadenopathy:     Cervical: No cervical adenopathy.  Skin:    Findings: No rash.  Neurological:     Mental Status: Daniel Sullivan is alert and oriented to person, place, and  time.     Cranial Nerves: No cranial nerve deficit.     Deep Tendon Reflexes: Reflexes normal.        Assessment:     Physical exam.  Patient has hypertension which is currently well controlled.  Daniel Sullivan did run out of losartan a week ago but blood pressures remain well controlled thus far.  We discussed the following issues    Plan:     -Obtain follow-up labs -The natural history of prostate cancer and ongoing controversy regarding screening and potential treatment outcomes of prostate cancer has been discussed with the patient. The meaning of a false positive PSA and a false negative PSA has been discussed. Daniel Sullivan indicates understanding of the limitations of this screening test and wishes  to proceed with screening PSA testing. -We recommend Daniel Sullivan monitor blood pressure closely over the next couple weeks.  If his systolic blood pressure remains less than 140 we will continue to hold losartan otherwise we will start back -Continue regular exercise habits  Eulas Post MD [facility] at [facility]

## 2019-12-04 NOTE — Patient Instructions (Signed)
Preventive Care 69 Years and Older, Male Preventive care refers to lifestyle choices and visits with your health care provider that can promote health and wellness. This includes:  A yearly physical exam. This is also called an annual well check.  Regular dental and eye exams.  Immunizations.  Screening for certain conditions.  Healthy lifestyle choices, such as diet and exercise. What can I expect for my preventive care visit? Physical exam Your health care provider will check:  Height and weight. These may be used to calculate body mass index (BMI), which is a measurement that tells if you are at a healthy weight.  Heart rate and blood pressure.  Your skin for abnormal spots. Counseling Your health care provider may ask you questions about:  Alcohol, tobacco, and drug use.  Emotional well-being.  Home and relationship well-being.  Sexual activity.  Eating habits.  History of falls.  Memory and ability to understand (cognition).  Work and work Statistician. What immunizations do I need?  Influenza (flu) vaccine  This is recommended every year. Tetanus, diphtheria, and pertussis (Tdap) vaccine  You may need a Td booster every 10 years. Varicella (chickenpox) vaccine  You may need this vaccine if you have not already been vaccinated. Zoster (shingles) vaccine  You may need this after age 50. Pneumococcal conjugate (PCV13) vaccine  One dose is recommended after age 24. Pneumococcal polysaccharide (PPSV23) vaccine  One dose is recommended after age 33. Measles, mumps, and rubella (MMR) vaccine  You may need at least one dose of MMR if you were born in 1957 or later. You may also need a second dose. Meningococcal conjugate (MenACWY) vaccine  You may need this if you have certain conditions. Hepatitis A vaccine  You may need this if you have certain conditions or if you travel or work in places where you may be exposed to hepatitis A. Hepatitis B vaccine   You may need this if you have certain conditions or if you travel or work in places where you may be exposed to hepatitis B. Haemophilus influenzae type b (Hib) vaccine  You may need this if you have certain conditions. You may receive vaccines as individual doses or as more than one vaccine together in one shot (combination vaccines). Talk with your health care provider about the risks and benefits of combination vaccines. What tests do I need? Blood tests  Lipid and cholesterol levels. These may be checked every 5 years, or more frequently depending on your overall health.  Hepatitis C test.  Hepatitis B test. Screening  Lung cancer screening. You may have this screening every year starting at age 74 if you have a 30-pack-year history of smoking and currently smoke or have quit within the past 15 years.  Colorectal cancer screening. All adults should have this screening starting at age 57 and continuing until age 54. Your health care provider may recommend screening at age 47 if you are at increased risk. You will have tests every 1-10 years, depending on your results and the type of screening test.  Prostate cancer screening. Recommendations will vary depending on your family history and other risks.  Diabetes screening. This is done by checking your blood sugar (glucose) after you have not eaten for a while (fasting). You may have this done every 1-3 years.  Abdominal aortic aneurysm (AAA) screening. You may need this if you are a current or former smoker.  Sexually transmitted disease (STD) testing. Follow these instructions at home: Eating and drinking  Eat  a diet that includes fresh fruits and vegetables, whole grains, lean protein, and low-fat dairy products. Limit your intake of foods with high amounts of sugar, saturated fats, and salt.  Take vitamin and mineral supplements as recommended by your health care provider.  Do not drink alcohol if your health care provider  tells you not to drink.  If you drink alcohol: ? Limit how much you have to 0-2 drinks a day. ? Be aware of how much alcohol is in your drink. In the [place] one drink equals one 12 oz bottle of beer (355 mL), one 5 oz glass of wine (148 mL), or one 1 oz glass of hard liquor (44 mL). Lifestyle  Take daily care of your teeth and gums.  Stay active. Exercise for at least 30 minutes on 5 or more days each week.  Do not use any products that contain nicotine or tobacco, such as cigarettes, e-cigarettes, and chewing tobacco. If you need help quitting, ask your health care provider.  If you are sexually active, practice safe sex. Use a condom or other form of protection to prevent STIs (sexually transmitted infections).  Talk with your health care provider about taking a low-dose aspirin or statin. What's next?  Visit your health care provider once a year for a well check visit.  Ask your health care provider how often you should have your eyes and teeth checked.  Stay up to date on all vaccines. This information is not intended to replace advice given to you by your health care provider. Make sure you discuss any questions you have with your health care provider. Document Released: 12/19/2015 Document Revised: 11/16/2018 Document Reviewed: 11/16/2018 Elsevier Patient Education  2020 Elsevier Inc.  

## 2019-12-14 ENCOUNTER — Ambulatory Visit: Payer: Medicare HMO | Admitting: Internal Medicine

## 2020-01-08 DIAGNOSIS — C44722 Squamous cell carcinoma of skin of right lower limb, including hip: Secondary | ICD-10-CM | POA: Diagnosis not present

## 2020-01-08 DIAGNOSIS — L578 Other skin changes due to chronic exposure to nonionizing radiation: Secondary | ICD-10-CM | POA: Diagnosis not present

## 2020-01-08 DIAGNOSIS — L570 Actinic keratosis: Secondary | ICD-10-CM | POA: Diagnosis not present

## 2020-01-15 ENCOUNTER — Ambulatory Visit: Payer: Medicare HMO | Admitting: Internal Medicine

## 2020-01-15 ENCOUNTER — Encounter: Payer: Self-pay | Admitting: Internal Medicine

## 2020-01-15 ENCOUNTER — Other Ambulatory Visit: Payer: Self-pay

## 2020-01-15 VITALS — BP 160/104 | HR 88 | Temp 98.3°F | Ht 69.0 in | Wt 180.2 lb

## 2020-01-15 DIAGNOSIS — K648 Other hemorrhoids: Secondary | ICD-10-CM

## 2020-01-15 DIAGNOSIS — R110 Nausea: Secondary | ICD-10-CM

## 2020-01-15 DIAGNOSIS — K580 Irritable bowel syndrome with diarrhea: Secondary | ICD-10-CM

## 2020-01-15 DIAGNOSIS — K529 Noninfective gastroenteritis and colitis, unspecified: Secondary | ICD-10-CM

## 2020-01-15 DIAGNOSIS — K219 Gastro-esophageal reflux disease without esophagitis: Secondary | ICD-10-CM

## 2020-01-15 NOTE — Progress Notes (Signed)
Daniel Sullivan 69 y.o. 1951/09/29 [id number]  Assessment & Plan:   Encounter Diagnoses  Name Primary?  . Chronic diarrhea Yes  . Nausea   . Irritable bowel syndrome with diarrhea   . Gastroesophageal reflux disease, unspecified whether esophagitis present   . Hemorrhoids with complication     I still think this is most likely IBS but he has never had microscopic colitis ruled out so we will schedule a colonoscopy with random biopsies.  Try antacids such as Tums or Gaviscon for this nausea to see if it is an acid related phenomenon.   Reassess hemorrhoids and colonoscopy.  Control of bowel habits reduces their symptoms.  He will try to loperamide morning and bedtime now in the interim.  Consider bile salt binder.  Further plans pending colonoscopy and biopsies and clinical course.  Note there is a history of diminutive colon polyps on 2015 colonoscopy I do not think I had that pathology.  Not sure will affect any recommendations I make clearly if this colonoscopy is without adenomas or SSP's I do not think he will need a routine repeat exam.  The risks and benefits as well as alternatives of endoscopic procedure(s) have been discussed and reviewed. All questions answered. The patient agrees to proceed.   I appreciate the opportunity to care for this patient. CC: Daniel Post, MD   Subjective:   Chief Complaint: Diarrhea and nausea  HPI the patient is here for follow-up of his diarrhea that I think is IBS.  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  Please see previous notes for further details of his history, when he was last seen in February 2020 he tried Imodium generic 2 at bedtime and he had a great benefit for about 3 months and then stool started to getting looser and softer and  having more frequent stools in the mornings.  Though he used to have 5 stools in the mornings he now only has about 3 so that get progressively softer it is better but he is still dissatisfied with his quality of life.  Hemorrhoids are flaring a bit now as well with prolapse issues.  Swelling.  Also having some intermittent nausea he has tried Dramamine with relief.  He is on a PPI at 20 mg of omeprazole daily he wonders if this is heartburn.  He was unable to [place] due to Covid, he was hoping to go in the summer of last year.  He has had one of the Covid vaccination is due again on March 4. No Known Allergies Current Meds  Medication Sig  . fluticasone (FLONASE) 50 MCG/ACT nasal spray USE 2 SPRAYS INTO BOTH NOSTRILS DAILY. (Patient taking differently: USE 2 SPRAYS INTO BOTH NOSTRILS DAILY. as needed)  . Glucosamine-Chondroit-Vit C-Mn TABS Take by mouth daily.   Marland Kitchen losartan (COZAAR) 50 MG tablet TAKE 1 TABLET BY MOUTH EVERY DAY  . Multiple Vitamins-Minerals (MENS MULTI VITAMIN & MINERAL PO) Take by mouth daily.    Marland Kitchen omeprazole (PRILOSEC) 20 MG capsule TAKE 1 CAPSULE BY MOUTH EVERY DAY  . pravastatin (PRAVACHOL) 40 MG tablet TAKE 1 TABLET BY MOUTH EVERY DAY  . triamcinolone cream (KENALOG) 0.1 % Apply BID to affected areas as needed for itching. Avoid face, groin, underarms  . vitamin  C (ASCORBIC ACID) 500 MG tablet Take 500 mg by mouth daily.   Past Medical History:  Diagnosis Date  . Allergy   . Asthma   . Diverticulosis   . Elevated PSA   . GERD (gastroesophageal reflux disease)   . Hemorrhoids   . Hx of adenomatous colonic polyps   . Hyperlipidemia   . Hypertension   . Hypogonadism in male    Past Surgical History:  Procedure Laterality Date  . ANAL RECTAL MANOMETRY N/A 09/07/2017   Procedure: ANO RECTAL MANOMETRY;  Surgeon: Mauri Pole, MD;  Location: [facility];  Service: Endoscopy;  Laterality: N/A;  . COLONOSCOPY W/ BIOPSIES    .  HEMORRHOID BANDING     Social History   Social History Narrative   Married, retired   Former smoker   + [place], no drugs   family history includes Alcohol abuse in his father; Melanoma (age of onset: 59) in his brother; Suicidality in his father.   Review of Systems As above Objective:   Physical Exam BP (!) 160/104 (BP Location: Left Arm, Patient Position: Sitting, Cuff Size: Normal)   Pulse 88   Temp 98.3 F (36.8 C)   Ht 5\' 9"  (1.753 m)   Wt 180 lb 4 oz (81.8 kg)   BMI 26.62 kg/m  No acute distress well-developed well-nourished white man

## 2020-01-15 NOTE — Patient Instructions (Addendum)
You have been scheduled for a colonoscopy. Please follow written instructions given to you at your visit today.  Please pick up your prep supplies at the pharmacy within the next 1-3 days. If you use inhalers (even only as needed), please bring them with you on the day of your procedure.  Use over the counter Tums or Gaviscon for Heartburn and nausea.   I appreciate the opportunity to care for you. Silvano Rusk, MD, [facility]

## 2020-01-17 DIAGNOSIS — C44722 Squamous cell carcinoma of skin of right lower limb, including hip: Secondary | ICD-10-CM | POA: Diagnosis not present

## 2020-02-04 DIAGNOSIS — L905 Scar conditions and fibrosis of skin: Secondary | ICD-10-CM | POA: Diagnosis not present

## 2020-02-04 DIAGNOSIS — Z85828 Personal history of other malignant neoplasm of skin: Secondary | ICD-10-CM | POA: Diagnosis not present

## 2020-02-04 DIAGNOSIS — L249 Irritant contact dermatitis, unspecified cause: Secondary | ICD-10-CM | POA: Diagnosis not present

## 2020-02-08 ENCOUNTER — Telehealth: Payer: Self-pay | Admitting: Family Medicine

## 2020-02-08 NOTE — Telephone Encounter (Signed)
Called patient and let him know to stay well hydrated, rest, and make sure he has good nutrition. Patient stated that he does not feel bad, just tired and he does not know if his thermometer is working correctly and he stated that he does not feel like he has a fever. I advised patient to call back next week if he is still not feeling well or go to Urgent Care or ER if his symptoms become worse and went over symptoms with him. Patient verbalized an understanding. Sending as Daniel Sullivan

## 2020-02-08 NOTE — Telephone Encounter (Signed)
Pt had second covid vaccine, and is not feeling well wanted to see if there is anything he should do. He has a low temp of 98.8. Pt would like a call back.    Patient [phone]

## 2020-02-22 ENCOUNTER — Encounter: Payer: Medicare HMO | Admitting: Internal Medicine

## 2020-02-25 ENCOUNTER — Other Ambulatory Visit: Payer: Self-pay | Admitting: Family Medicine

## 2020-02-28 ENCOUNTER — Other Ambulatory Visit: Payer: Self-pay

## 2020-02-28 ENCOUNTER — Other Ambulatory Visit: Payer: Self-pay | Admitting: Family Medicine

## 2020-02-29 ENCOUNTER — Ambulatory Visit (INDEPENDENT_AMBULATORY_CARE_PROVIDER_SITE_OTHER): Payer: Medicare HMO | Admitting: Family Medicine

## 2020-02-29 ENCOUNTER — Encounter: Payer: Self-pay | Admitting: Family Medicine

## 2020-02-29 VITALS — BP 136/80 | HR 96 | Temp 97.8°F | Wt 177.0 lb

## 2020-02-29 DIAGNOSIS — I10 Essential (primary) hypertension: Secondary | ICD-10-CM

## 2020-02-29 DIAGNOSIS — R739 Hyperglycemia, unspecified: Secondary | ICD-10-CM | POA: Diagnosis not present

## 2020-02-29 DIAGNOSIS — R42 Dizziness and giddiness: Secondary | ICD-10-CM

## 2020-02-29 LAB — POCT GLYCOSYLATED HEMOGLOBIN (HGB A1C): Hemoglobin A1C: 5.9 % — AB (ref 4.0–5.6)

## 2020-02-29 NOTE — Progress Notes (Signed)
Subjective:     Patient ID: Daniel Sullivan, male   DOB: 1951/08/25, 69 y.o.   MRN: [id number]  HPI   Daniel Sullivan is seen with about 10-day history of some nonspecific lightheadedness.  Symptoms have been very intermittent.  Possibly worse with position change.  No syncope.  No chest pain.  No other clear triggers.  He states he drinks about 3 glasses of water per day.  Does drink some coffee and occasional beer.  He does have hypertension and is currently on amlodipine and losartan.  He does not described consistent orthostatic type symptoms.  He has hypertension treated with amlodipine and losartan.  His blood pressures tend to vary considerably by home readings.  No visual changes.  No focal weakness.  Patient had labs back in December that were stable with exception of glucose 121.  His last A1c was almost 2 years ago and 6.3%.  No polyuria or polydipsia.  Appetite and weight stable.  Past Medical History:  Diagnosis Date  . Allergy   . Asthma   . Diverticulosis   . Elevated PSA   . GERD (gastroesophageal reflux disease)   . Hemorrhoids   . Hx of adenomatous colonic polyps   . Hyperlipidemia   . Hypertension   . Hypogonadism in male    Past Surgical History:  Procedure Laterality Date  . ANAL RECTAL MANOMETRY N/A 09/07/2017   Procedure: ANO RECTAL MANOMETRY;  Surgeon: Mauri Pole, MD;  Location: [facility];  Service: Endoscopy;  Laterality: N/A;  . COLONOSCOPY W/ BIOPSIES    . HEMORRHOID BANDING      reports that he quit smoking about 32 years ago. His smoking use included cigarettes. He has a 7.50 pack-year smoking history. He has never used smokeless tobacco. He reports current alcohol use. He reports that he does not use drugs. family history includes Alcohol abuse in his father; Melanoma (age of onset: 71) in his brother; Suicidality in his father. No Known Allergies  Wt Readings from Last 3 Encounters:  02/29/20 177 lb (80.3 kg)  01/15/20 180 lb 4 oz (81.8 kg)   12/04/19 182 lb (82.6 kg)     Review of Systems  Constitutional: Negative for chills and fever.  Respiratory: Negative for shortness of breath.   Cardiovascular: Negative for chest pain.  Gastrointestinal: Negative for abdominal pain, diarrhea, nausea and vomiting.  Genitourinary: Negative for dysuria.  Neurological: Positive for light-headedness. Negative for dizziness, seizures, syncope, facial asymmetry, weakness and headaches.       Objective:   Physical Exam Vitals reviewed.  Constitutional:      Appearance: Normal appearance.  Cardiovascular:     Rate and Rhythm: Normal rate and regular rhythm.  Pulmonary:     Effort: Pulmonary effort is normal.     Breath sounds: Normal breath sounds.  Musculoskeletal:     Cervical back: Neck supple.     Right lower leg: No edema.     Left lower leg: No edema.  Lymphadenopathy:     Cervical: No cervical adenopathy.  Neurological:     General: No focal deficit present.     Mental Status: He is alert and oriented to person, place, and time.     Cranial Nerves: No cranial nerve deficit.     Motor: No weakness.        Assessment:     #1 nonspecific lightheadedness.  We obtain seated blood pressure 130/80 and standing 136/80.  He has nonfocal exam ?mild dehydration.  #2 history  of prediabetes range blood sugars.  Not monitoring currently at home.  Hemoglobin A1c today= 5.9%  #3 hypertension stable on today's readings    Plan:     -Check hemoglobin A1c=5.9% -We discussed increasing hydration especially noncaffeinated and nonalcoholic beverages -We discussed importance of changing positions slowly -Would not change his current blood pressure regimen  Eulas Post MD [facility]

## 2020-02-29 NOTE — Patient Instructions (Addendum)
Increase water consumption  Change positions slowly  Continue current blood pressure regimen  Your hemoglobin A1c today is 5.9% which represents good glycemic control

## 2020-03-20 ENCOUNTER — Ambulatory Visit (AMBULATORY_SURGERY_CENTER): Payer: Medicare HMO | Admitting: Internal Medicine

## 2020-03-20 ENCOUNTER — Encounter: Payer: Self-pay | Admitting: Internal Medicine

## 2020-03-20 ENCOUNTER — Other Ambulatory Visit: Payer: Self-pay

## 2020-03-20 VITALS — BP 154/90 | HR 61 | Temp 96.8°F | Resp 15 | Ht 69.0 in | Wt 180.0 lb

## 2020-03-20 DIAGNOSIS — K5730 Diverticulosis of large intestine without perforation or abscess without bleeding: Secondary | ICD-10-CM | POA: Diagnosis not present

## 2020-03-20 DIAGNOSIS — K580 Irritable bowel syndrome with diarrhea: Secondary | ICD-10-CM | POA: Diagnosis not present

## 2020-03-20 DIAGNOSIS — K529 Noninfective gastroenteritis and colitis, unspecified: Secondary | ICD-10-CM

## 2020-03-20 DIAGNOSIS — E785 Hyperlipidemia, unspecified: Secondary | ICD-10-CM | POA: Diagnosis not present

## 2020-03-20 DIAGNOSIS — I10 Essential (primary) hypertension: Secondary | ICD-10-CM | POA: Diagnosis not present

## 2020-03-20 DIAGNOSIS — K599 Functional intestinal disorder, unspecified: Secondary | ICD-10-CM | POA: Diagnosis not present

## 2020-03-20 DIAGNOSIS — K219 Gastro-esophageal reflux disease without esophagitis: Secondary | ICD-10-CM | POA: Diagnosis not present

## 2020-03-20 DIAGNOSIS — R197 Diarrhea, unspecified: Secondary | ICD-10-CM | POA: Diagnosis not present

## 2020-03-20 MED ORDER — SODIUM CHLORIDE 0.9 % IV SOLN: 500.0000 mL | Freq: Once | INTRAVENOUS | Status: DC

## 2020-03-20 NOTE — Patient Instructions (Addendum)
I saw your diverticulosis as before. No polyps or cancer.  I took biopsies of the colon lining to see if we will find microscopic inflammation that can be treated to improve the loose stools  Will call w/ results and plans.  I appreciate the opportunity to care for you. Gatha Mayer, MD, [facility]   Handouts given for diverticulosis and high fiber diet.  YOU HAD AN ENDOSCOPIC PROCEDURE TODAY AT [facility]:   Refer to the procedure report that was given to you for any specific questions about what was found during the examination.  If the procedure report does not answer your questions, please call your gastroenterologist to clarify.  If you requested that your care partner not be given the details of your procedure findings, then the procedure report has been included in a sealed envelope for you to review at your convenience later.  YOU SHOULD EXPECT: Some feelings of bloating in the abdomen. Passage of more gas than usual.  Walking can help get rid of the air that was put into your GI tract during the procedure and reduce the bloating. If you had a lower endoscopy (such as a colonoscopy or flexible sigmoidoscopy) you may notice spotting of blood in your stool or on the toilet paper. If you underwent a bowel prep for your procedure, you may not have a normal bowel movement for a few days.  Please Note:  You might notice some irritation and congestion in your nose or some drainage.  This is from the oxygen used during your procedure.  There is no need for concern and it should clear up in a day or so.  SYMPTOMS TO REPORT IMMEDIATELY:   Following lower endoscopy (colonoscopy or flexible sigmoidoscopy):  Excessive amounts of blood in the stool  Significant tenderness or worsening of abdominal pains  Swelling of the abdomen that is new, acute  Fever of 100F or higher  For urgent or emergent issues, a gastroenterologist can be reached at any hour by calling [phone]. Do  not use MyChart messaging for urgent concerns.    DIET:  We do recommend a small meal at first, but then you may proceed to your regular diet.  Drink plenty of fluids but you should avoid alcoholic beverages for 24 hours.  ACTIVITY:  You should plan to take it easy for the rest of today and you should NOT DRIVE or use heavy machinery until tomorrow (because of the sedation medicines used during the test).    FOLLOW UP: Our staff will call the number listed on your records 48-72 hours following your procedure to check on you and address any questions or concerns that you may have regarding the information given to you following your procedure. If we do not reach you, we will leave a message.  We will attempt to reach you two times.  During this call, we will ask if you have developed any symptoms of COVID 19. If you develop any symptoms (ie: fever, flu-like symptoms, shortness of breath, cough etc.) before then, please call [phone].  If you test positive for Covid 19 in the 2 weeks post procedure, please call and report this information to [place].    If any biopsies were taken you will be contacted by phone or by letter within the next 1-3 weeks.  Please call us at [phone] if you have not heard about the biopsies in 3 weeks.    SIGNATURES/CONFIDENTIALITY: You and/or your care partner have signed paperwork  which will be entered into your electronic medical record.  These signatures attest to the fact that that the information above on your After Visit Summary has been reviewed and is understood.  Full responsibility of the confidentiality of this discharge information lies with you and/or your care-partner.

## 2020-03-20 NOTE — Op Note (Signed)
[place] Patient Name: Daniel Sullivan Procedure Date: 03/20/2020 10:54 AM MRN: [id number] Endoscopist: Gatha Mayer , MD Age: 69 Referring MD:  Date of Birth: 1951-05-09 Gender: Male Account #: [id number] Procedure:                Colonoscopy Indications:              Clinically significant diarrhea of unexplained                            origin Medicines:                Propofol per Anesthesia, Monitored Anesthesia Care Procedure:                Pre-Anesthesia Assessment:                           - Prior to the procedure, a History and Physical                            was performed, and patient medications and                            allergies were reviewed. The patient's tolerance of                            previous anesthesia was also reviewed. The risks                            and benefits of the procedure and the sedation                            options and risks were discussed with the patient.                            All questions were answered, and informed consent                            was obtained. Prior Anticoagulants: The patient has                            taken no previous anticoagulant or antiplatelet                            agents. ASA Grade Assessment: III - A patient with                            severe systemic disease. After reviewing the risks                            and benefits, the patient was deemed in                            satisfactory condition to undergo the procedure.  After obtaining informed consent, the colonoscope                            was passed under direct vision. Throughout the                            procedure, the patient's blood pressure, pulse, and                            oxygen saturations were monitored continuously. The                            Colonoscope was introduced through the anus and                            advanced to the the terminal  ileum, with                            identification of the appendiceal orifice and IC                            valve. The colonoscopy was performed without                            difficulty. The patient tolerated the procedure                            well. The quality of the bowel preparation was                            adequate. The bowel preparation used was Miralax                            via split dose instruction. The terminal ileum, the                            appendiceal orifice and the rectum were                            photographed. Scope In: 11:14:48 AM Scope Out: 11:29:09 AM Scope Withdrawal Time: 0 hours 12 minutes 55 seconds  Total Procedure Duration: 0 hours 14 minutes 21 seconds  Findings:                 The perianal and digital rectal examinations were                            normal.                           Multiple small and large-mouthed diverticula were                            found in the entire colon.  The exam was otherwise without abnormality on                            direct and retroflexion views.                           Biopsies for histology were taken with a cold                            forceps from the ascending colon, transverse colon,                            descending colon and sigmoid colon for evaluation                            of microscopic colitis. Estimated blood loss was                            minimal.                           The terminal ileum appeared normal. Complications:            No immediate complications. Estimated Blood Loss:     Estimated blood loss was minimal. Impression:               - Diverticulosis in the entire examined colon.                           - The examination was otherwise normal on direct                            and retroflexion views.                           - The examined portion of the ileum was normal.                           -  Biopsies were taken with a cold forceps from the                            ascending colon, transverse colon, descending colon                            and sigmoid colon for evaluation of microscopic                            colitis. Recommendation:           - Patient has a contact number available for                            emergencies. The signs and symptoms of potential                            delayed complications were discussed with the  patient. Return to normal activities tomorrow.                            Written discharge instructions were provided to the                            patient.                           - Resume previous diet.                           - Continue present medications.                           - Await pathology results.                           - No repeat colonoscopy due to age and the absence                            of colonic polyps. Gatha Mayer, MD 03/20/2020 11:37:14 AM This report has been signed electronically.

## 2020-03-20 NOTE — Progress Notes (Signed)
Report to PACU, RN, vss, BBS= Clear.  

## 2020-03-20 NOTE — Progress Notes (Signed)
Called to room to assist during endoscopic procedure.  Patient ID and intended procedure confirmed with present staff. Received instructions for my participation in the procedure from the performing physician.  

## 2020-03-24 ENCOUNTER — Telehealth: Payer: Self-pay | Admitting: *Deleted

## 2020-03-24 NOTE — Telephone Encounter (Signed)
  Follow up Call-  Call back number 03/20/2020  Post procedure Call Back phone  # [phone]  Permission to leave phone message Yes  Some recent data might be hidden     No answer at # given.  LM on VM.

## 2020-03-24 NOTE — Telephone Encounter (Signed)
Second attempt, left VM.  

## 2020-04-15 ENCOUNTER — Encounter: Payer: Self-pay | Admitting: Family Medicine

## 2020-04-23 ENCOUNTER — Telehealth: Payer: Self-pay | Admitting: Family Medicine

## 2020-04-23 NOTE — Progress Notes (Signed)
  Chronic Care Management   Outreach Note  04/23/2020 Name: Daniel Sullivan MRN: [id number] DOB: 1951-05-16  Referred by: Eulas Post, MD Reason for referral : No chief complaint on file.   An unsuccessful telephone outreach was attempted today. The patient was referred to the pharmacist for assistance with care management and care coordination.   Follow Up Plan:   Prathima Ghanta [employer] Scheduler

## 2020-04-25 ENCOUNTER — Other Ambulatory Visit: Payer: Self-pay | Admitting: Internal Medicine

## 2020-04-25 MED ORDER — METRONIDAZOLE 500 MG PO TABS: 500.0000 mg | ORAL_TABLET | Freq: Three times a day (TID) | ORAL | 0 refills | Status: AC

## 2020-04-27 ENCOUNTER — Other Ambulatory Visit: Payer: Self-pay | Admitting: Family Medicine

## 2020-05-01 ENCOUNTER — Encounter: Payer: Self-pay | Admitting: Family Medicine

## 2020-05-02 MED ORDER — AMLODIPINE BESYLATE 5 MG PO TABS: 5.0000 mg | ORAL_TABLET | Freq: Every day | ORAL | 2 refills | Status: DC

## 2020-05-07 ENCOUNTER — Telehealth: Payer: Self-pay | Admitting: Family Medicine

## 2020-05-07 NOTE — Progress Notes (Signed)
  Chronic Care Management   Outreach Note  05/07/2020 Name: Daniel Sullivan MRN: [id number] DOB: 01-26-1951  Referred by: Eulas Post, MD Reason for referral : No chief complaint on file.   A second unsuccessful telephone outreach was attempted today. The patient was referred to pharmacist for assistance with care management and care coordination. This note is not being shared with the patient for the following reason: To respect privacy (The patient or proxy has requested that the information not be shared).   Follow Up Plan:   [place]

## 2020-05-13 ENCOUNTER — Other Ambulatory Visit: Payer: Self-pay

## 2020-05-13 ENCOUNTER — Encounter: Payer: Self-pay | Admitting: Family Medicine

## 2020-05-14 ENCOUNTER — Ambulatory Visit (INDEPENDENT_AMBULATORY_CARE_PROVIDER_SITE_OTHER): Payer: Medicare HMO | Admitting: Family Medicine

## 2020-05-14 VITALS — BP 122/78 | HR 75 | Temp 98.3°F | Wt 171.9 lb

## 2020-05-14 DIAGNOSIS — R42 Dizziness and giddiness: Secondary | ICD-10-CM

## 2020-05-14 DIAGNOSIS — H8112 Benign paroxysmal vertigo, left ear: Secondary | ICD-10-CM | POA: Diagnosis not present

## 2020-05-14 NOTE — Patient Instructions (Signed)
Benign Positional Vertigo Vertigo is the feeling that you or your surroundings are moving when they are not. Benign positional vertigo is the most common form of vertigo. This is usually a harmless condition (benign). This condition is positional. This means that symptoms are triggered by certain movements and positions. This condition can be dangerous if it occurs while you are doing something that could cause harm to you or others. This includes activities such as driving or operating machinery. What are the causes? In many cases, the cause of this condition is not known. It may be caused by a disturbance in an area of the inner ear that helps your brain to sense movement and balance. This disturbance can be caused by:  Viral infection (labyrinthitis).  Head injury.  Repetitive motion, such as jumping, dancing, or running. What increases the risk? You are more likely to develop this condition if:  You are a woman.  You are 50 years of age or older. What are the signs or symptoms? Symptoms of this condition usually happen when you move your head or your eyes in different directions. Symptoms may start suddenly, and usually last for less than a minute. They include:  Loss of balance and falling.  Feeling like you are spinning or moving.  Feeling like your surroundings are spinning or moving.  Nausea and vomiting.  Blurred vision.  Dizziness.  Involuntary eye movement (nystagmus). Symptoms can be mild and cause only minor problems, or they can be severe and interfere with daily life. Episodes of benign positional vertigo may return (recur) over time. Symptoms may improve over time. How is this diagnosed? This condition may be diagnosed based on:  Your medical history.  Physical exam of the head, neck, and ears.  Tests, such as: ? MRI. ? CT scan. ? Eye movement tests. Your health care provider may ask you to change positions quickly while he or she watches you for symptoms  of benign positional vertigo, such as nystagmus. Eye movement may be tested with a variety of exams that are designed to evaluate or stimulate vertigo. ? An electroencephalogram (EEG). This records electrical activity in your brain. ? Hearing tests. You may be referred to a health care provider who specializes in ear, nose, and throat (ENT) problems (otolaryngologist) or a provider who specializes in disorders of the nervous system (neurologist). How is this treated?  This condition may be treated in a session in which your health care provider moves your head in specific positions to adjust your inner ear back to normal. Treatment for this condition may take several sessions. Surgery may be needed in severe cases, but this is rare. In some cases, benign positional vertigo may resolve on its own in 2-4 weeks. Follow these instructions at home: Safety  Move slowly. Avoid sudden body or head movements or certain positions, as told by your health care provider.  Avoid driving until your health care provider says it is safe for you to do so.  Avoid operating heavy machinery until your health care provider says it is safe for you to do so.  Avoid doing any tasks that would be dangerous to you or others if vertigo occurs.  If you have trouble walking or keeping your balance, try using a cane for stability. If you feel dizzy or unstable, sit down right away.  Return to your normal activities as told by your health care provider. Ask your health care provider what activities are safe for you. General instructions  Take over-the-counter   and prescription medicines only as told by your health care provider.  Drink enough fluid to keep your urine pale yellow.  Keep all follow-up visits as told by your health care provider. This is important. Contact a health care provider if:  You have a fever.  Your condition gets worse or you develop new symptoms.  Your family or friends notice any  behavioral changes.  You have nausea or vomiting that gets worse.  You have numbness or a "pins and needles" sensation. Get help right away if you:  Have difficulty speaking or moving.  Are always dizzy.  Faint.  Develop severe headaches.  Have weakness in your legs or arms.  Have changes in your hearing or vision.  Develop a stiff neck.  Develop sensitivity to light. Summary  Vertigo is the feeling that you or your surroundings are moving when they are not. Benign positional vertigo is the most common form of vertigo.  The cause of this condition is not known. It may be caused by a disturbance in an area of the inner ear that helps your brain to sense movement and balance.  Symptoms include loss of balance and falling, feeling that you or your surroundings are moving, nausea and vomiting, and blurred vision.  This condition can be diagnosed based on symptoms, physical exam, and other tests, such as MRI, CT scan, eye movement tests, and hearing tests.  Follow safety instructions as told by your health care provider. You will also be told when to contact your health care provider in case of problems. This information is not intended to replace advice given to you by your health care provider. Make sure you discuss any questions you have with your health care provider. Document Revised: 05/03/2018 Document Reviewed: 05/03/2018 Elsevier Patient Education  2020 [employer] Inc[employer]  

## 2020-05-14 NOTE — Progress Notes (Signed)
Established Patient Office Visit  Subjective:  Patient ID: Daniel Sullivan, male    DOB: August 20, 1951  Age: 69 y.o. MRN: [id number]  CC:  Chief Complaint  Patient presents with  . Dizziness    Pt wants to discuss dizziness believes that amlodipine is the cause     HPI Daniel Sullivan presents for intermittent dizziness.  He does have history of hypertension and he initially thought this may be related to his blood pressure medication.  His blood pressures are well controlled and we reduced his amlodipine from 10 mg to 5 mg daily.  This did not seem to make much difference.  Symptoms seem to be worse at night when getting out of bed and seem to be definitely triggered with movement.  Sometimes noted with looking up.  Symptoms worse with head tilted to the left side.  No syncope.  No headaches.  No speech changes.  No focal weakness.  Has still been able to cycle without much difficulty.  Symptoms very intermittent.  No ataxia.  He is not describing any orthostatic type symptoms.  Past Medical History:  Diagnosis Date  . Allergy   . Arthritis   . Asthma   . 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 Diverticulosis   . Elevated PSA   . GERD (gastroesophageal reflux disease)   . Hemorrhoids   . Hx of adenomatous colonic polyps   . Hyperlipidemia   . Hypertension   . Hypogonadism in male     Past Surgical History:  Procedure Laterality Date  . ANAL RECTAL MANOMETRY N/A 09/07/2017   Procedure: ANO RECTAL MANOMETRY;  Surgeon: Mauri Pole, MD;  Location: [facility];  Service: Endoscopy;  Laterality: N/A;  . COLONOSCOPY W/ BIOPSIES    . HEMORRHOID BANDING      Family History  Problem Relation Age of Onset  . Alcohol abuse Father   . Suicidality Father   . Melanoma Brother 51  . Stomach cancer Neg Hx   . Colon cancer Neg Hx   . Rectal cancer Neg Hx   . Esophageal cancer Neg Hx     Social History   Socioeconomic History  . Marital status: Married   Spouse name: Not on file  . Number of children: Not on file  . Years of education: Not on file  . Highest education level: Not on file  Occupational History  . Occupation: retired  Tobacco Use  . Smoking status: Former Smoker    Packs/day: 0.50    Years: 15.00    Pack years: 7.50    Types: Cigarettes    Quit date: 02/24/1988    Years since quitting: 32.2  . Smokeless tobacco: Never Used  Substance and Sexual Activity  . Alcohol use: Yes    Comment: occasionally  . Drug use: No  . Sexual activity: Yes    Partners: Female  Other Topics Concern  . Not on file  Social History Narrative   Married, retired   Former smoker   + [place], no drugs   Social Determinants of Radio broadcast assistant Strain:   . Difficulty of Paying Living Expenses:   Food Insecurity:   . Worried About Charity fundraiser in the Last Year:   . Arboriculturist in the Last Year:   Transportation Needs:   . Film/video editor (Medical):   Marland Kitchen Lack of Transportation (Non-Medical):   Physical Activity:   .  Days of Exercise per Week:   . Minutes of Exercise per Session:   Stress:   . Feeling of Stress :   Social Connections:   . Frequency of Communication with Friends and Family:   . Frequency of Social Gatherings with Friends and Family:   . Attends Religious Services:   . Active Member of Clubs or Organizations:   . Attends Archivist Meetings:   Marland Kitchen Marital Status:   Intimate Partner Violence:   . Fear of Current or Ex-Partner:   . Emotionally Abused:   Marland Kitchen Physically Abused:   . Sexually Abused:     Outpatient Medications Prior to Visit  Medication Sig Dispense Refill  . amLODipine (NORVASC) 5 MG tablet Take 1 tablet (5 mg total) by mouth daily. 90 tablet 2  . fluticasone (CUTIVATE) 0.005 % ointment Apply BID to face PRN. Apply 2nd    . Glucosamine-Chondroit-Vit C-Mn TABS Take by mouth daily.     . hydrocortisone 2.5 % cream apply bid prn irritation    . losartan (COZAAR) 50 MG  tablet TAKE 1 TABLET BY MOUTH EVERY DAY 90 tablet 0  . Multiple Vitamins-Minerals (MENS MULTI VITAMIN & MINERAL PO) Take by mouth daily.      . mupirocin ointment (BACTROBAN) 2 % Apply  a small amount to affected area twice a day  Apply 1st    . omeprazole (PRILOSEC) 20 MG capsule TAKE 1 CAPSULE BY MOUTH EVERY DAY 90 capsule 1  . pravastatin (PRAVACHOL) 10 MG tablet 1 Add'l Sig oral Select Frequency    . triamcinolone cream (KENALOG) 0.1 % Apply BID to affected areas as needed for itching. Avoid face, groin, underarms    . vitamin C (ASCORBIC ACID) 500 MG tablet Take 500 mg by mouth daily.    . fluticasone (FLONASE) 50 MCG/ACT nasal spray USE 2 SPRAYS INTO BOTH NOSTRILS DAILY. (Patient not taking: Reported on 03/20/2020) 16 g 0   No facility-administered medications prior to visit.    No Known Allergies  ROS Review of Systems  Respiratory: Negative for cough and shortness of breath.   Cardiovascular: Negative for chest pain.  Gastrointestinal: Negative for abdominal pain.  Neurological: Positive for dizziness. Negative for seizures, syncope, speech difficulty and weakness.  Hematological: Negative for adenopathy.  Psychiatric/Behavioral: Negative for confusion.      Objective:    Physical Exam  Constitutional: He is oriented to person, place, and time. He appears well-developed and well-nourished.  Neck: No thyromegaly present.  No carotid bruits  Cardiovascular: Normal rate and regular rhythm.  Pulmonary/Chest: Effort normal and breath sounds normal.  Musculoskeletal:        General: No edema.     Cervical back: Neck supple.  Neurological: He is alert and oriented to person, place, and time. No cranial nerve deficit. Coordination normal.  He does have vertigo symptoms which are triggered with lying supine with head to the left but not the right    BP 122/78 (BP Location: Left Arm, Patient Position: Sitting, Cuff Size: Normal)   Pulse 75   Temp 98.3 F (36.8 C) (Temporal)    Wt 171 lb 14.4 oz (78 kg)   SpO2 95%   BMI 25.39 kg/m  Wt Readings from Last 3 Encounters:  05/14/20 171 lb 14.4 oz (78 kg)  03/20/20 180 lb (81.6 kg)  02/29/20 177 lb (80.3 kg)     Health Maintenance Due  Topic Date Due  . TETANUS/TDAP  12/07/2019    There are no preventive  care reminders to display for this patient.  Lab Results  Component Value Date   TSH 2.01 12/04/2019   Lab Results  Component Value Date   WBC 7.3 12/04/2019   HGB 13.4 12/04/2019   HCT 40.2 12/04/2019   MCV 95.0 12/04/2019   PLT 184.0 12/04/2019   Lab Results  Component Value Date   NA 138 12/04/2019   K 4.5 12/04/2019   CO2 32 12/04/2019   GLUCOSE 121 (H) 12/04/2019   BUN 14 12/04/2019   CREATININE 0.82 12/04/2019   BILITOT 0.5 12/04/2019   ALKPHOS 57 12/04/2019   AST 26 12/04/2019   ALT 19 12/04/2019   PROT 7.0 12/04/2019   ALBUMIN 4.4 12/04/2019   CALCIUM 9.1 12/04/2019   GFR 92.94 12/04/2019   Lab Results  Component Value Date   CHOL 201 (H) 12/04/2019   Lab Results  Component Value Date   HDL 58.70 12/04/2019   Lab Results  Component Value Date   LDLCALC 121 (H) 12/04/2019   Lab Results  Component Value Date   TRIG 104.0 12/04/2019   Lab Results  Component Value Date   CHOLHDL 3 12/04/2019   Lab Results  Component Value Date   HGBA1C 5.9 (A) 02/29/2020      Assessment & Plan:   Patient presents with several weeks of intermittent dizziness.  Symptoms and exam suggest likely benign peripheral positional vertigo to the left.  Nonfocal neuro exam at this time  -Would recommend Epley maneuvers with handout given. -We recommend he do these at least 3 times daily and for at least 24 hours after symptoms resolve and be in touch if symptoms not resolving over the next few days -We reviewed red flags and things to watch out for for more worrisome vertigo  No orders of the defined types were placed in this encounter.   Follow-up: No follow-ups on file.    Carolann Littler, MD

## 2020-05-16 ENCOUNTER — Encounter: Payer: Self-pay | Admitting: Family Medicine

## 2020-05-21 ENCOUNTER — Telehealth: Payer: Self-pay | Admitting: Family Medicine

## 2020-05-21 NOTE — Progress Notes (Signed)
°  Chronic Care Management   Outreach Note  05/21/2020 Name: Daniel Sullivan MRN: [id number] DOB: [birth year]  Referred by: Eulas Post, MD Reason for referral : No chief complaint on file.   Third unsuccessful telephone outreach was attempted today. The patient was referred to the pharmacist for assistance with care management and care coordination.   Follow Up Plan:  [place]

## 2020-05-27 ENCOUNTER — Encounter: Payer: Self-pay | Admitting: Family Medicine

## 2020-05-27 DIAGNOSIS — H8112 Benign paroxysmal vertigo, left ear: Secondary | ICD-10-CM

## 2020-07-01 ENCOUNTER — Ambulatory Visit (INDEPENDENT_AMBULATORY_CARE_PROVIDER_SITE_OTHER): Payer: Medicare HMO | Admitting: Otolaryngology

## 2020-07-01 DIAGNOSIS — Z20822 Contact with and (suspected) exposure to covid-19: Secondary | ICD-10-CM | POA: Diagnosis not present

## 2020-07-01 DIAGNOSIS — Z03818 Encounter for observation for suspected exposure to other biological agents ruled out: Secondary | ICD-10-CM | POA: Diagnosis not present

## 2020-07-08 ENCOUNTER — Ambulatory Visit (INDEPENDENT_AMBULATORY_CARE_PROVIDER_SITE_OTHER): Payer: Medicare HMO | Admitting: Otolaryngology

## 2020-07-08 ENCOUNTER — Encounter (INDEPENDENT_AMBULATORY_CARE_PROVIDER_SITE_OTHER): Payer: Self-pay | Admitting: Otolaryngology

## 2020-07-08 ENCOUNTER — Other Ambulatory Visit: Payer: Self-pay

## 2020-07-08 VITALS — Temp 97.2°F

## 2020-07-08 DIAGNOSIS — R42 Dizziness and giddiness: Secondary | ICD-10-CM | POA: Diagnosis not present

## 2020-07-08 DIAGNOSIS — H8110 Benign paroxysmal vertigo, unspecified ear: Secondary | ICD-10-CM | POA: Diagnosis not present

## 2020-07-08 NOTE — Progress Notes (Signed)
HPI: Daniel Sullivan is a 69 y.o. male who presents is referred by Dr. Elease Hashimoto for evaluation of dizziness.  Patient apparently has had episodes of dizziness for over 5 years.  More recently has had some episodes of vertigo when he turns his head in a certain position especially when he turns it to the left.  He was given information on benign positional vertigo and the Epley maneuver that he has been performing at home and the positional vertigo is doing much better.  He still complains of occasional dizziness when he first stands up or bends over and stands up.  Does not really describe vertigo or spinning sensation more just imbalance. He had hearing aids prescribed to him about 5 years ago also but does not wear the hearing aids as he feels like he hears okay without them.  He has always had worse hearing in the right ear..  Past Medical History:  Diagnosis Date  . Allergy   . Arthritis   . Asthma   . 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 Diverticulosis   . Elevated PSA   . GERD (gastroesophageal reflux disease)   . Hemorrhoids   . Hx of adenomatous colonic polyps   . Hyperlipidemia   . Hypertension   . Hypogonadism in male    Past Surgical History:  Procedure Laterality Date  . ANAL RECTAL MANOMETRY N/A 09/07/2017   Procedure: ANO RECTAL MANOMETRY;  Surgeon: Mauri Pole, MD;  Location: [facility];  Service: Endoscopy;  Laterality: N/A;  . COLONOSCOPY W/ BIOPSIES    . HEMORRHOID BANDING     Social History   Socioeconomic History  . Marital status: Married    Spouse name: Not on file  . Number of children: Not on file  . Years of education: Not on file  . Highest education level: Not on file  Occupational History  . Occupation: retired  Tobacco Use  . Smoking status: Former Smoker    Packs/day: 0.50    Years: 15.00    Pack years: 7.50    Types: Cigarettes    Quit date: 02/24/1988    Years since quitting: 32.3  . Smokeless tobacco: Never  Used  Vaping Use  . Vaping Use: Never used  Substance and Sexual Activity  . Alcohol use: Yes    Comment: occasionally  . Drug use: No  . Sexual activity: Yes    Partners: Female  Other Topics Concern  . Not on file  Social History Narrative   Married, retired   Former smoker   + [place], no drugs   Social Determinants of Radio broadcast assistant Strain:   . Difficulty of Paying Living Expenses:   Food Insecurity:   . Worried About Charity fundraiser in the Last Year:   . Arboriculturist in the Last Year:   Transportation Needs:   . Film/video editor (Medical):   Marland Kitchen Lack of Transportation (Non-Medical):   Physical Activity:   . Days of Exercise per Week:   . Minutes of Exercise per Session:   Stress:   . Feeling of Stress :   Social Connections:   . Frequency of Communication with Friends and Family:   . Frequency of Social Gatherings with Friends and Family:   . Attends Religious Services:   . Active Member of Clubs or Organizations:   . Attends Archivist Meetings:   Marland Kitchen Marital Status:  Family History  Problem Relation Age of Onset  . Alcohol abuse Father   . Suicidality Father   . Melanoma Brother 75  . Stomach cancer Neg Hx   . Colon cancer Neg Hx   . Rectal cancer Neg Hx   . Esophageal cancer Neg Hx    No Known Allergies Prior to Admission medications   Medication Sig Start Date End Date Taking? Authorizing Provider  amLODipine (NORVASC) 5 MG tablet Take 1 tablet (5 mg total) by mouth daily. 05/02/20  Yes Burchette, Alinda Sierras, MD  fluticasone (CUTIVATE) 0.005 % ointment Apply BID to face PRN. Apply 2nd 02/04/20  Yes [provider]  fluticasone (FLONASE) 50 MCG/ACT nasal spray USE 2 SPRAYS INTO BOTH NOSTRILS DAILY. 02/14/17  Yes Burchette, Alinda Sierras, MD  Glucosamine-Chondroit-Vit C-Mn TABS Take by mouth daily.    Yes [provider]  hydrocortisone 2.5 % cream apply bid prn irritation 01/28/20  Yes [provider]   losartan (COZAAR) 50 MG tablet TAKE 1 TABLET BY MOUTH EVERY DAY 04/28/20  Yes Burchette, Alinda Sierras, MD  Multiple Vitamins-Minerals (MENS MULTI VITAMIN & MINERAL PO) Take by mouth daily.     Yes [provider]  mupirocin ointment (BACTROBAN) 2 % Apply  a small amount to affected area twice a day  Apply 1st 02/04/20  Yes [provider]  omeprazole (PRILOSEC) 20 MG capsule TAKE 1 CAPSULE BY MOUTH EVERY DAY 09/13/19  Yes Burchette, Alinda Sierras, MD  pravastatin (PRAVACHOL) 10 MG tablet 1 Add'l Sig oral Select Frequency 11/13/19  Yes [provider]  triamcinolone cream (KENALOG) 0.1 % Apply BID to affected areas as needed for itching. Avoid face, groin, underarms 11/13/19  Yes [provider]  vitamin C (ASCORBIC ACID) 500 MG tablet Take 500 mg by mouth daily.   Yes [provider]     Positive ROS: Otherwise negative  All other systems have been reviewed and were otherwise negative with the exception of those mentioned in the HPI and as above.  Physical Exam: Constitutional: Alert, well-appearing, no acute distress Ears: External ears without lesions or tenderness.  Has a small amount of wax in both ear canals that was cleaned with a curette.  Both TMs were clear with good mobility on pneumatic otoscopy.  On Dix-Hallpike testing he had no evidence of benign positional vertigo in the office today.  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 Nasal: External nose without lesions. Clear nasal passages Oral: Lips and gums without lesions. Tongue and palate mucosa without lesions. Posterior oropharynx clear. Neck: No palpable adenopathy or masses. Respiratory: Breathing comfortably  Skin: No facial/neck lesions or rash noted.  Procedures  Assessment: Dizziness I suspect is more central in nature than related to inner ear problem. No clinical evidence of  BPPV in the office today and patient has an understanding of the Epley maneuver as well as BPPV.  Plan: Patient was interested in having inner ear balance testing however our audiologist does not take his insurance. I have referred him to Dr. Benjamine Mola who also performs inner ear testing.   Radene Journey, MD   CC:

## 2020-07-22 ENCOUNTER — Encounter: Payer: Self-pay | Admitting: Family Medicine

## 2020-07-23 ENCOUNTER — Other Ambulatory Visit: Payer: Self-pay | Admitting: Family Medicine

## 2020-07-29 DIAGNOSIS — D485 Neoplasm of uncertain behavior of skin: Secondary | ICD-10-CM | POA: Diagnosis not present

## 2020-07-29 DIAGNOSIS — L905 Scar conditions and fibrosis of skin: Secondary | ICD-10-CM | POA: Diagnosis not present

## 2020-07-29 DIAGNOSIS — L570 Actinic keratosis: Secondary | ICD-10-CM | POA: Diagnosis not present

## 2020-07-29 DIAGNOSIS — C44722 Squamous cell carcinoma of skin of right lower limb, including hip: Secondary | ICD-10-CM | POA: Diagnosis not present

## 2020-07-29 DIAGNOSIS — Z85828 Personal history of other malignant neoplasm of skin: Secondary | ICD-10-CM | POA: Diagnosis not present

## 2020-07-30 ENCOUNTER — Encounter: Payer: Self-pay | Admitting: Family Medicine

## 2020-07-30 ENCOUNTER — Other Ambulatory Visit: Payer: Self-pay | Admitting: Internal Medicine

## 2020-07-30 DIAGNOSIS — R42 Dizziness and giddiness: Secondary | ICD-10-CM | POA: Diagnosis not present

## 2020-07-30 DIAGNOSIS — H903 Sensorineural hearing loss, bilateral: Secondary | ICD-10-CM | POA: Diagnosis not present

## 2020-07-30 MED ORDER — CHOLESTYRAMINE LIGHT 4 G PO PACK: 4.0000 g | PACK | Freq: Every day | ORAL | 1 refills | Status: DC

## 2020-08-01 ENCOUNTER — Encounter: Payer: Self-pay | Admitting: Family Medicine

## 2020-08-01 ENCOUNTER — Other Ambulatory Visit: Payer: Self-pay

## 2020-08-01 ENCOUNTER — Ambulatory Visit (INDEPENDENT_AMBULATORY_CARE_PROVIDER_SITE_OTHER): Payer: Medicare HMO | Admitting: Family Medicine

## 2020-08-01 VITALS — BP 160/90 | HR 84 | Temp 97.9°F | Wt 177.5 lb

## 2020-08-01 DIAGNOSIS — R5383 Other fatigue: Secondary | ICD-10-CM

## 2020-08-01 DIAGNOSIS — R972 Elevated prostate specific antigen [PSA]: Secondary | ICD-10-CM

## 2020-08-01 DIAGNOSIS — R7989 Other specified abnormal findings of blood chemistry: Secondary | ICD-10-CM

## 2020-08-01 DIAGNOSIS — I10 Essential (primary) hypertension: Secondary | ICD-10-CM | POA: Diagnosis not present

## 2020-08-01 NOTE — Patient Instructions (Signed)
Monitor blood pressure and if consistently > 694 systolic get back on the Amlodipine.

## 2020-08-01 NOTE — Progress Notes (Signed)
Established Patient Office Visit  Subjective:  Patient ID: Daniel Sullivan, male    DOB: March 06, 1951  Age: 69 y.o. MRN: [id number]  CC:  Chief Complaint  Patient presents with  . Heat Exposure    and discuss testosterone    HPI Daniel Sullivan presents for intermittent fatigue.  He had bout of probable heat exhaustion weeks ago and feels that his threshold for having similar symptoms has been lower since then.  No history of heat stroke.  He is still exercising with things like cycling but seems to have less tolerance.  Has had no chest pain whatsoever.  No dyspnea.  He does have history of low testosterone and is requesting repeat levels.  He had difficulty years ago getting coverage for topical and cost was prohibitive.  He states he has different insurance and would like to consider getting retested at this time.  He does have history of elevated PSA but had urologic work-up and biopsies were negative.  His PSA levels have been fairly stable for several years.  He feels he is staying well-hydrated.  He does have occasional muscle cramps but not consistently.  He has hypertension and is currently on losartan.  He is supposed be on amlodipine but does not take consistently.  Not monitoring blood pressure regularly in recent weeks.  Past Medical History:  Diagnosis Date  . Allergy   . Arthritis   . Asthma   . 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 Diverticulosis   . Elevated PSA   . GERD (gastroesophageal reflux disease)   . Hemorrhoids   . Hx of adenomatous colonic polyps   . Hyperlipidemia   . Hypertension   . Hypogonadism in male     Past Surgical History:  Procedure Laterality Date  . ANAL RECTAL MANOMETRY N/A 09/07/2017   Procedure: ANO RECTAL MANOMETRY;  Surgeon: Mauri Pole, MD;  Location: [facility];  Service: Endoscopy;  Laterality: N/A;  . COLONOSCOPY W/ BIOPSIES    . HEMORRHOID BANDING      Family History  Problem Relation Age of Onset   . Alcohol abuse Father   . Suicidality Father   . Melanoma Brother 27  . Stomach cancer Neg Hx   . Colon cancer Neg Hx   . Rectal cancer Neg Hx   . Esophageal cancer Neg Hx     Social History   Socioeconomic History  . Marital status: Married    Spouse name: Not on file  . Number of children: Not on file  . Years of education: Not on file  . Highest education level: Not on file  Occupational History  . Occupation: retired  Tobacco Use  . Smoking status: Former Smoker    Packs/day: 0.50    Years: 15.00    Pack years: 7.50    Types: Cigarettes    Quit date: 02/24/1988    Years since quitting: 32.4  . Smokeless tobacco: Never Used  Vaping Use  . Vaping Use: Never used  Substance and Sexual Activity  . Alcohol use: Yes    Comment: occasionally  . Drug use: No  . Sexual activity: Yes    Partners: Female  Other Topics Concern  . Not on file  Social History Narrative   Married, retired   Former smoker   + [place], no drugs   Social Determinants of Radio broadcast assistant Strain:   . Difficulty of Paying Living Expenses: Not on  file  Food Insecurity:   . Worried About Charity fundraiser in the Last Year: Not on file  . Ran Out of Food in the Last Year: Not on file  Transportation Needs:   . Lack of Transportation (Medical): Not on file  . Lack of Transportation (Non-Medical): Not on file  Physical Activity:   . Days of Exercise per Week: Not on file  . Minutes of Exercise per Session: Not on file  Stress:   . Feeling of Stress : Not on file  Social Connections:   . Frequency of Communication with Friends and Family: Not on file  . Frequency of Social Gatherings with Friends and Family: Not on file  . Attends Religious Services: Not on file  . Active Member of Clubs or Organizations: Not on file  . Attends Archivist Meetings: Not on file  . Marital Status: Not on file  Intimate Partner Violence:   . Fear of Current or Ex-Partner: Not on file  .  Emotionally Abused: Not on file  . Physically Abused: Not on file  . Sexually Abused: Not on file    Outpatient Medications Prior to Visit  Medication Sig Dispense Refill  . amLODipine (NORVASC) 5 MG tablet Take 1 tablet (5 mg total) by mouth daily. 90 tablet 2  . cholestyramine light (PREVALITE) 4 g packet Take 1 packet (4 g total) by mouth at bedtime. 30 packet 1  . fluticasone (CUTIVATE) 0.005 % ointment Apply BID to face PRN. Apply 2nd    . fluticasone (FLONASE) 50 MCG/ACT nasal spray USE 2 SPRAYS INTO BOTH NOSTRILS DAILY. 16 g 0  . Glucosamine-Chondroit-Vit C-Mn TABS Take by mouth daily.     . hydrocortisone 2.5 % cream apply bid prn irritation    . losartan (COZAAR) 50 MG tablet TAKE 1 TABLET BY MOUTH EVERY DAY 90 tablet 0  . Multiple Vitamins-Minerals (MENS MULTI VITAMIN & MINERAL PO) Take by mouth daily.      . mupirocin ointment (BACTROBAN) 2 % Apply  a small amount to affected area twice a day  Apply 1st    . omeprazole (PRILOSEC) 20 MG capsule TAKE 1 CAPSULE BY MOUTH EVERY DAY 90 capsule 1  . pravastatin (PRAVACHOL) 10 MG tablet 1 Add'l Sig oral Select Frequency    . triamcinolone cream (KENALOG) 0.1 % Apply BID to affected areas as needed for itching. Avoid face, groin, underarms    . vitamin C (ASCORBIC ACID) 500 MG tablet Take 500 mg by mouth daily.     No facility-administered medications prior to visit.    No Known Allergies  ROS Review of Systems  Constitutional: Positive for fatigue. Negative for chills and fever.  Respiratory: Negative for cough and shortness of breath.   Cardiovascular: Negative for chest pain, palpitations and leg swelling.  Gastrointestinal: Negative for abdominal pain.  Genitourinary: Negative for dysuria.  Neurological: Negative for syncope and headaches.  Hematological: Negative for adenopathy.  Psychiatric/Behavioral: Negative for confusion.      Objective:    Physical Exam Vitals reviewed.  Constitutional:      Appearance: Normal  appearance. He is not ill-appearing.  HENT:     Head: Normocephalic and atraumatic.  Cardiovascular:     Rate and Rhythm: Normal rate and regular rhythm.  Pulmonary:     Effort: Pulmonary effort is normal.     Breath sounds: Normal breath sounds.  Musculoskeletal:     Cervical back: Neck supple.     Right lower leg: No  edema.     Left lower leg: No edema.  Lymphadenopathy:     Cervical: No cervical adenopathy.  Skin:    Findings: No rash.  Neurological:     General: No focal deficit present.     Mental Status: He is alert and oriented to person, place, and time.     BP (!) 160/90 (BP Location: Left Arm, Patient Position: Sitting, Cuff Size: Normal)   Pulse 84   Temp 97.9 F (36.6 C) (Oral)   Wt 177 lb 8 oz (80.5 kg)   SpO2 97%   BMI 26.21 kg/m  Wt Readings from Last 3 Encounters:  08/01/20 177 lb 8 oz (80.5 kg)  05/14/20 171 lb 14.4 oz (78 kg)  03/20/20 180 lb (81.6 kg)     Health Maintenance Due  Topic Date Due  . TETANUS/TDAP  12/07/2019  . INFLUENZA VACCINE  07/06/2020    There are no preventive care reminders to display for this patient.  Lab Results  Component Value Date   TSH 2.01 12/04/2019   Lab Results  Component Value Date   WBC 7.3 12/04/2019   HGB 13.4 12/04/2019   HCT 40.2 12/04/2019   MCV 95.0 12/04/2019   PLT 184.0 12/04/2019   Lab Results  Component Value Date   NA 138 12/04/2019   K 4.5 12/04/2019   CO2 32 12/04/2019   GLUCOSE 121 (H) 12/04/2019   BUN 14 12/04/2019   CREATININE 0.82 12/04/2019   BILITOT 0.5 12/04/2019   ALKPHOS 57 12/04/2019   AST 26 12/04/2019   ALT 19 12/04/2019   PROT 7.0 12/04/2019   ALBUMIN 4.4 12/04/2019   CALCIUM 9.1 12/04/2019   GFR 92.94 12/04/2019   Lab Results  Component Value Date   CHOL 201 (H) 12/04/2019   Lab Results  Component Value Date   HDL 58.70 12/04/2019   Lab Results  Component Value Date   LDLCALC 121 (H) 12/04/2019   Lab Results  Component Value Date   TRIG 104.0  12/04/2019   Lab Results  Component Value Date   CHOLHDL 3 12/04/2019   Lab Results  Component Value Date   HGBA1C 5.9 (A) 02/29/2020      Assessment & Plan:   #1 hypertension poorly controlled by today's reading.  Repeat left arm seated after rest 160/90  -Continue losartan and get back on amlodipine 5 mg daily -Monitor blood pressure and be in touch if not consistently less than 140/90 after a couple weeks back on amlodipine  #2 fatigue.  This seems to be exacerbated following recent heat exhaustion episode.  He is hydrating well including some electrolyte replacement and trying to avoid extreme heat of the day  -Check basic metabolic panel, magnesium, CBC, testosterone level  #3 history of low testosterone.  Patient requesting repeat levels  -We had a discussion regarding pros and cons of testosterone replacement.  He does have history of elevated PSA levels though stable with negative biopsies and this will need to be monitored very closely -Return next week for early morning testosterone level along with repeat PSA  #4 history of mildly elevated PSA with negative biopsies   No orders of the defined types were placed in this encounter.   Follow-up: No follow-ups on file.    Carolann Littler, MD

## 2020-08-04 ENCOUNTER — Other Ambulatory Visit (INDEPENDENT_AMBULATORY_CARE_PROVIDER_SITE_OTHER): Payer: Medicare HMO

## 2020-08-04 ENCOUNTER — Other Ambulatory Visit: Payer: Self-pay

## 2020-08-04 ENCOUNTER — Other Ambulatory Visit: Payer: Self-pay | Admitting: Family Medicine

## 2020-08-04 DIAGNOSIS — R972 Elevated prostate specific antigen [PSA]: Secondary | ICD-10-CM | POA: Diagnosis not present

## 2020-08-04 DIAGNOSIS — R7989 Other specified abnormal findings of blood chemistry: Secondary | ICD-10-CM | POA: Diagnosis not present

## 2020-08-04 DIAGNOSIS — R5383 Other fatigue: Secondary | ICD-10-CM

## 2020-08-06 ENCOUNTER — Telehealth: Payer: Self-pay | Admitting: Family Medicine

## 2020-08-06 LAB — MAGNESIUM: Magnesium: 1.9 mg/dL (ref 1.5–2.5)

## 2020-08-06 LAB — BASIC METABOLIC PANEL
BUN: 10 mg/dL (ref 7–25)
CO2: 31 mmol/L (ref 20–32)
Calcium: 8.9 mg/dL (ref 8.6–10.3)
Chloride: 100 mmol/L (ref 98–110)
Creat: 0.81 mg/dL (ref 0.70–1.18)
Glucose, Bld: 111 mg/dL — ABNORMAL HIGH (ref 65–99)
Potassium: 4.3 mmol/L (ref 3.5–5.3)
Sodium: 139 mmol/L (ref 135–146)

## 2020-08-06 LAB — PSA: PSA: 4.2 ng/mL — ABNORMAL HIGH (ref <=4.0)

## 2020-08-06 LAB — TESTOSTERONE: Testosterone: 145 ng/dL — ABNORMAL LOW (ref 250–827)

## 2020-08-06 LAB — CBC WITH DIFFERENTIAL/PLATELET

## 2020-08-06 MED ORDER — TESTOSTERONE 20.25 MG/ACT (1.62%) TD GEL: TRANSDERMAL | 5 refills | Status: DC

## 2020-08-06 NOTE — Addendum Note (Signed)
Addended by: Eulas Post on: 08/06/2020 07:31 AM   Modules accepted: Orders

## 2020-08-06 NOTE — Progress Notes (Signed)
Testosterone is low, as expected.  His PSA is actually down some from last year which is good.  Let him know I have sent in AndroGel pump spray and start 1 pump spray per arm once daily and recommend 39-month follow-up in office.  We will repeat testosterone level and CBC at that time

## 2020-08-06 NOTE — Telephone Encounter (Signed)
Attempted to schedule AWV. Unable to LVM.  Will try at later time.  

## 2020-08-13 ENCOUNTER — Other Ambulatory Visit: Payer: Self-pay

## 2020-08-13 ENCOUNTER — Encounter: Payer: Self-pay | Admitting: Family Medicine

## 2020-08-13 ENCOUNTER — Ambulatory Visit (INDEPENDENT_AMBULATORY_CARE_PROVIDER_SITE_OTHER): Payer: Medicare HMO | Admitting: Otolaryngology

## 2020-08-13 VITALS — Temp 97.2°F

## 2020-08-13 DIAGNOSIS — R42 Dizziness and giddiness: Secondary | ICD-10-CM

## 2020-08-13 DIAGNOSIS — H903 Sensorineural hearing loss, bilateral: Secondary | ICD-10-CM | POA: Diagnosis not present

## 2020-08-13 NOTE — Progress Notes (Signed)
HPI: Daniel Sullivan is a 69 y.o. male who returns today for evaluation of dizziness that started 5 years ago.  He describes the dizziness as lightheadedness and only last for a few minutes but occurs weekly.  On exam in the office he had no clinical evidence of BPPV.  He was subsequently referred to Dr. Deeann Saint office to have vestibular evaluation and audiologic testing.  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  SRT's were 65 on the right and 25 on the left.  Discrimination scores were both above 80%.  He had type A tympanograms bilaterally.  On the VNG testing this did not demonstrate any BPPV or significant vestibular abnormalities but was more suggestive of central etiology.  There recommendation was referral to physical therapy for vestibular rehab.  He would also be a candidate for hearing aids and I discussed this with him..  Past Medical History:  Diagnosis Date  . Allergy   . Arthritis   . Asthma   . 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 Diverticulosis   . Elevated PSA   . GERD (gastroesophageal reflux disease)   . Hemorrhoids   . Hx of adenomatous colonic polyps   . Hyperlipidemia   . Hypertension   . Hypogonadism in male    Past Surgical History:  Procedure Laterality Date  . ANAL RECTAL MANOMETRY N/A 09/07/2017   Procedure: ANO RECTAL MANOMETRY;  Surgeon: Mauri Pole, MD;  Location: [facility];  Service: Endoscopy;  Laterality: N/A;  . COLONOSCOPY W/ BIOPSIES    . HEMORRHOID BANDING     Social History   Socioeconomic History  . Marital status: Married    Spouse name: Not on file  . Number of children: Not on file  . Years of education: Not on file  . Highest education level: Not on file  Occupational History  . Occupation: retired  Tobacco Use  . Smoking status: Former Smoker    Packs/day: 0.50    Years: 15.00     Pack years: 7.50    Types: Cigarettes    Quit date: 02/24/1988    Years since quitting: 32.4  . Smokeless tobacco: Never Used  Vaping Use  . Vaping Use: Never used  Substance and Sexual Activity  . Alcohol use: Yes    Comment: occasionally  . Drug use: No  . Sexual activity: Yes    Partners: Female  Other Topics Concern  . Not on file  Social History Narrative   Married, retired   Former smoker   + [place], no drugs   Social Determinants of Radio broadcast assistant Strain:   . Difficulty of Paying Living Expenses: Not on file  Food Insecurity:   . Worried About Charity fundraiser in the Last Year: Not on file  . Ran Out of Food in the Last Year: Not on file  Transportation Needs:   . Lack of Transportation (Medical): Not on file  . Lack of Transportation (Non-Medical): Not on file  Physical Activity:   . Days of Exercise per Week: Not on file  . Minutes of Exercise per Session: Not on file  Stress:   . Feeling of Stress : Not on file  Social Connections:   . Frequency of Communication with Friends and Family: Not on file  . Frequency of Social Gatherings with Friends and Family:  Not on file  . Attends Religious Services: Not on file  . Active Member of Clubs or Organizations: Not on file  . Attends Archivist Meetings: Not on file  . Marital Status: Not on file   Family History  Problem Relation Age of Onset  . Alcohol abuse Father   . Suicidality Father   . Melanoma Brother 65  . Stomach cancer Neg Hx   . Colon cancer Neg Hx   . Rectal cancer Neg Hx   . Esophageal cancer Neg Hx    No Known Allergies Prior to Admission medications   Medication Sig Start Date End Date Taking? Authorizing Provider  amLODipine (NORVASC) 5 MG tablet Take 1 tablet (5 mg total) by mouth daily. 05/02/20  Yes Burchette, Alinda Sierras, MD  cholestyramine light (PREVALITE) 4 g packet Take 1 packet (4 g total) by mouth at bedtime. 07/30/20  Yes Gatha Mayer, MD  fluticasone  (CUTIVATE) 0.005 % ointment Apply BID to face PRN. Apply 2nd 02/04/20  Yes [provider]  fluticasone (FLONASE) 50 MCG/ACT nasal spray USE 2 SPRAYS INTO BOTH NOSTRILS DAILY. 02/14/17  Yes Burchette, Alinda Sierras, MD  Glucosamine-Chondroit-Vit C-Mn TABS Take by mouth daily.    Yes [provider]  hydrocortisone 2.5 % cream apply bid prn irritation 01/28/20  Yes [provider]  losartan (COZAAR) 50 MG tablet TAKE 1 TABLET BY MOUTH EVERY DAY 08/05/20  Yes Burchette, Alinda Sierras, MD  Multiple Vitamins-Minerals (MENS MULTI VITAMIN & MINERAL PO) Take by mouth daily.     Yes [provider]  mupirocin ointment (BACTROBAN) 2 % Apply  a small amount to affected area twice a day  Apply 1st 02/04/20  Yes [provider]  omeprazole (PRILOSEC) 20 MG capsule TAKE 1 CAPSULE BY MOUTH EVERY DAY 07/23/20  Yes Burchette, Alinda Sierras, MD  pravastatin (PRAVACHOL) 10 MG tablet 1 Add'l Sig oral Select Frequency 11/13/19  Yes [provider]  Testosterone 20.25 MG/ACT (1.62%) GEL Apply 1 pump spray per arm once daily early morning 08/06/20  Yes Burchette, Alinda Sierras, MD  triamcinolone cream (KENALOG) 0.1 % Apply BID to affected areas as needed for itching. Avoid face, groin, underarms 11/13/19  Yes [provider]  vitamin C (ASCORBIC ACID) 500 MG tablet Take 500 mg by mouth daily.   Yes [provider]     Positive ROS: Otherwise negative  All other systems have been reviewed and were otherwise negative with the exception of those mentioned in the HPI and as above.  Physical Exam: Constitutional: Alert, well-appearing, no acute distress Ears: External ears without lesions or tenderness. Ear canals with minimal wax buildup and clear TMs. Nasal: External nose without lesions. Clear nasal passages Oral: Lips and gums without lesions. Tongue and palate mucosa without lesions. Posterior oropharynx clear. Neck: No palpable adenopathy or masses Respiratory: Breathing  comfortably  Skin: No facial/neck lesions or rash noted.  Procedures  Assessment: Chronic dizziness.  VNG testing demonstrated more of a central etiology for his dizziness.  There was no evidence of BPPV observed on positional testing and no evidence of significant vestibular abnormality although I suspect he does have weakness of his vestibular apparatus as he has moderate severe downsloping SNHL in both ears.  Plan: Have referred him for [facility] rehab with [facility]. Also discussed with him concerning benefit of hearing aids for his overall hearing.   Radene Journey, MD

## 2020-08-14 ENCOUNTER — Encounter (INDEPENDENT_AMBULATORY_CARE_PROVIDER_SITE_OTHER): Payer: Self-pay

## 2020-08-18 ENCOUNTER — Other Ambulatory Visit: Payer: Self-pay

## 2020-08-18 ENCOUNTER — Encounter: Payer: Self-pay | Admitting: Family Medicine

## 2020-08-18 ENCOUNTER — Ambulatory Visit (INDEPENDENT_AMBULATORY_CARE_PROVIDER_SITE_OTHER): Payer: Medicare HMO | Admitting: Family Medicine

## 2020-08-18 VITALS — BP 142/82 | HR 73 | Temp 98.2°F | Ht 69.0 in | Wt 178.0 lb

## 2020-08-18 DIAGNOSIS — R61 Generalized hyperhidrosis: Secondary | ICD-10-CM

## 2020-08-18 DIAGNOSIS — R5383 Other fatigue: Secondary | ICD-10-CM

## 2020-08-18 DIAGNOSIS — G2581 Restless legs syndrome: Secondary | ICD-10-CM

## 2020-08-18 MED ORDER — PRAMIPEXOLE DIHYDROCHLORIDE 0.125 MG PO TABS: ORAL_TABLET | ORAL | 2 refills | Status: DC

## 2020-08-18 NOTE — Progress Notes (Signed)
Established Patient Office Visit  Subjective:  Patient ID: Daniel Sullivan, male    DOB: [birth year]  Age: 69 y.o. MRN: [id number]  CC:  Chief Complaint  Patient presents with  . heart concerns    HPI Daniel Sullivan presents for concern for possible restless leg syndrome and also requesting EKG.  He had episode this summer very got overheated with exercise and felt like he had some heat exhaustion.  He felt like he has had some difficulty regaining his energy since then.  He has never had any chest pain.  No exertional dyspnea.  He does some cycling but has been pulling back for the past several weeks.  Some of his cycling friends had suggested that he get an EKG.  He has never had any chest pain.  No family history of premature CAD.  He relates that night having some vague symptoms of his legs with some vague discomfort that is relieved consistently when he gets up to ambulate.  He had read about restless legs and feels fairly certain that describes his symptoms.  He does not have any pain with ambulation.  He does have occasional cramps but this discomfort is different.  He tries to avoid caffeine.  No anemia by recent labs.  Past Medical History:  Diagnosis Date  . Allergy   . Arthritis   . Asthma   . 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 Diverticulosis   . Elevated PSA   . GERD (gastroesophageal reflux disease)   . Hemorrhoids   . Hx of adenomatous colonic polyps   . Hyperlipidemia   . Hypertension   . Hypogonadism in male     Past Surgical History:  Procedure Laterality Date  . ANAL RECTAL MANOMETRY N/A 09/07/2017   Procedure: ANO RECTAL MANOMETRY;  Surgeon: Mauri Pole, MD;  Location: [facility];  Service: Endoscopy;  Laterality: N/A;  . COLONOSCOPY W/ BIOPSIES    . HEMORRHOID BANDING      Family History  Problem Relation Age of Onset  . Alcohol abuse Father   . Suicidality Father   . Melanoma Brother 29  . Stomach cancer Neg Hx   .  Colon cancer Neg Hx   . Rectal cancer Neg Hx   . Esophageal cancer Neg Hx     Social History   Socioeconomic History  . Marital status: Married    Spouse name: Not on file  . Number of children: Not on file  . Years of education: Not on file  . Highest education level: Not on file  Occupational History  . Occupation: retired  Tobacco Use  . Smoking status: Former Smoker    Packs/day: 0.50    Years: 15.00    Pack years: 7.50    Types: Cigarettes    Quit date: 02/24/1988    Years since quitting: 32.5  . Smokeless tobacco: Never Used  Vaping Use  . Vaping Use: Never used  Substance and Sexual Activity  . Alcohol use: Yes    Comment: occasionally  . Drug use: No  . Sexual activity: Yes    Partners: Female  Other Topics Concern  . Not on file  Social History Narrative   Married, retired   Former smoker   + [place], no drugs   Social Determinants of Radio broadcast assistant Strain:   . Difficulty of Paying Living Expenses: Not on file  Food Insecurity:   . Worried About Running  Out of Food in the Last Year: Not on file  . Ran Out of Food in the Last Year: Not on file  Transportation Needs:   . Lack of Transportation (Medical): Not on file  . Lack of Transportation (Non-Medical): Not on file  Physical Activity:   . Days of Exercise per Week: Not on file  . Minutes of Exercise per Session: Not on file  Stress:   . Feeling of Stress : Not on file  Social Connections:   . Frequency of Communication with Friends and Family: Not on file  . Frequency of Social Gatherings with Friends and Family: Not on file  . Attends Religious Services: Not on file  . Active Member of Clubs or Organizations: Not on file  . Attends Archivist Meetings: Not on file  . Marital Status: Not on file  Intimate Partner Violence:   . Fear of Current or Ex-Partner: Not on file  . Emotionally Abused: Not on file  . Physically Abused: Not on file  . Sexually Abused: Not on file     Outpatient Medications Prior to Visit  Medication Sig Dispense Refill  . amLODipine (NORVASC) 5 MG tablet Take 1 tablet (5 mg total) by mouth daily. 90 tablet 2  . cholestyramine light (PREVALITE) 4 g packet Take 1 packet (4 g total) by mouth at bedtime. 30 packet 1  . fluticasone (CUTIVATE) 0.005 % ointment Apply BID to face PRN. Apply 2nd    . fluticasone (FLONASE) 50 MCG/ACT nasal spray USE 2 SPRAYS INTO BOTH NOSTRILS DAILY. 16 g 0  . Glucosamine-Chondroit-Vit C-Mn TABS Take by mouth daily.     . hydrocortisone 2.5 % cream apply bid prn irritation    . losartan (COZAAR) 50 MG tablet TAKE 1 TABLET BY MOUTH EVERY DAY 90 tablet 3  . Multiple Vitamins-Minerals (MENS MULTI VITAMIN & MINERAL PO) Take by mouth daily.      . mupirocin ointment (BACTROBAN) 2 % Apply  a small amount to affected area twice a day  Apply 1st    . omeprazole (PRILOSEC) 20 MG capsule TAKE 1 CAPSULE BY MOUTH EVERY DAY 90 capsule 1  . pravastatin (PRAVACHOL) 10 MG tablet 1 Add'l Sig oral Select Frequency    . Testosterone 20.25 MG/ACT (1.62%) GEL Apply 1 pump spray per arm once daily early morning 75 g 5  . triamcinolone cream (KENALOG) 0.1 % Apply BID to affected areas as needed for itching. Avoid face, groin, underarms    . vitamin C (ASCORBIC ACID) 500 MG tablet Take 500 mg by mouth daily.     No facility-administered medications prior to visit.    No Known Allergies  ROS Review of Systems  Constitutional: Positive for fatigue. Negative for chills and fever.  Respiratory: Negative for shortness of breath.   Cardiovascular: Negative for chest pain, palpitations and leg swelling.  Gastrointestinal: Negative for abdominal pain.  Genitourinary: Negative for dysuria.  Hematological: Negative for adenopathy.  Psychiatric/Behavioral: Positive for sleep disturbance.      Objective:    Physical Exam Vitals reviewed.  Constitutional:      Appearance: Normal appearance.  Cardiovascular:     Rate and Rhythm:  Normal rate and regular rhythm.  Pulmonary:     Effort: Pulmonary effort is normal.     Breath sounds: Normal breath sounds.  Musculoskeletal:     Right lower leg: No edema.     Left lower leg: No edema.  Neurological:     General: No focal deficit  present.     Mental Status: He is alert.     Motor: No weakness.     BP (!) 142/82   Pulse 73   Temp 98.2 F (36.8 C) (Other (Comment))   Ht 5\' 9"  (1.753 m)   Wt 178 lb (80.7 kg)   SpO2 96%   BMI 26.29 kg/m  Wt Readings from Last 3 Encounters:  08/18/20 178 lb (80.7 kg)  08/01/20 177 lb 8 oz (80.5 kg)  05/14/20 171 lb 14.4 oz (78 kg)     Health Maintenance Due  Topic Date Due  . TETANUS/TDAP  12/07/2019  . INFLUENZA VACCINE  07/06/2020    There are no preventive care reminders to display for this patient.  Lab Results  Component Value Date   TSH 2.01 12/04/2019   Lab Results  Component Value Date   WBC CANCELED 08/04/2020   HGB 13.4 12/04/2019   HCT 40.2 12/04/2019   MCV 95.0 12/04/2019   PLT 184.0 12/04/2019   Lab Results  Component Value Date   NA 139 08/04/2020   K 4.3 08/04/2020   CO2 31 08/04/2020   GLUCOSE 111 (H) 08/04/2020   BUN 10 08/04/2020   CREATININE 0.81 08/04/2020   BILITOT 0.5 12/04/2019   ALKPHOS 57 12/04/2019   AST 26 12/04/2019   ALT 19 12/04/2019   PROT 7.0 12/04/2019   ALBUMIN 4.4 12/04/2019   CALCIUM 8.9 08/04/2020   GFR 92.94 12/04/2019   Lab Results  Component Value Date   CHOL 201 (H) 12/04/2019   Lab Results  Component Value Date   HDL 58.70 12/04/2019   Lab Results  Component Value Date   LDLCALC 121 (H) 12/04/2019   Lab Results  Component Value Date   TRIG 104.0 12/04/2019   Lab Results  Component Value Date   CHOLHDL 3 12/04/2019   Lab Results  Component Value Date   HGBA1C 5.9 (A) 02/29/2020      Assessment & Plan:   #1 patient relates recent issues of increased fatigue with exercise.  He had multiple labs at the end of August which were mostly  unremarkable.  Electrolytes were stable.  We explained that heat exhaustion can sometimes cause fatigue for several days but not generally for weeks.  -EKG shows sinus rhythm with no acute changes. -We did discuss possible further cardiology work-up but at this point he declines.  He has not had any chest pain whatsoever no recent exertional dyspnea -He will slowly try to build back his stamina starting some low-grade exercise and if he has any issues whatsoever with difficulties building back will be in touch for further work-up  #2 probable restless leg syndrome -Continue to avoid caffeine -Consider trial of Mirapex 0.125 mg 1-2 nightly.  Give feedback if not getting relief over the next week with this  Meds ordered this encounter  Medications  . pramipexole (MIRAPEX) 0.125 MG tablet    Sig: Take one to two tablets at night as needed for restless legs.    Dispense:  60 tablet    Refill:  2    Follow-up: No follow-ups on file.    Carolann Littler, MD

## 2020-08-18 NOTE — Patient Instructions (Signed)

## 2020-08-20 ENCOUNTER — Other Ambulatory Visit: Payer: Self-pay | Admitting: Family Medicine

## 2020-08-21 ENCOUNTER — Telehealth: Payer: Self-pay

## 2020-08-21 NOTE — Telephone Encounter (Signed)
[name] Key: Ferdie Ping - PA Case ID: [id number] - Rx #: [id number] Need help? Call us at [phone] Status Sent to [facility] Testosterone 20.25 MG/ACT(1.62%) gel Form

## 2020-08-28 DIAGNOSIS — C44722 Squamous cell carcinoma of skin of right lower limb, including hip: Secondary | ICD-10-CM | POA: Diagnosis not present

## 2020-09-02 DIAGNOSIS — R42 Dizziness and giddiness: Secondary | ICD-10-CM | POA: Diagnosis not present

## 2020-09-12 ENCOUNTER — Other Ambulatory Visit: Payer: Self-pay | Admitting: Family Medicine

## 2020-09-12 NOTE — Telephone Encounter (Signed)
Chart has Pravastatin 10 mg .  Not sure where the 40 mg request is coming from.  Needs clarification.

## 2020-09-28 ENCOUNTER — Other Ambulatory Visit: Payer: Self-pay | Admitting: Family Medicine

## 2020-10-04 NOTE — Telephone Encounter (Signed)
Refill for three months.   He will need follow up lipids and hepatic in 2-3 months.

## 2020-10-06 ENCOUNTER — Encounter (INDEPENDENT_AMBULATORY_CARE_PROVIDER_SITE_OTHER): Payer: Self-pay

## 2020-10-11 ENCOUNTER — Other Ambulatory Visit: Payer: Self-pay | Admitting: Family Medicine

## 2020-12-02 ENCOUNTER — Encounter: Payer: Self-pay | Admitting: Family Medicine

## 2020-12-02 DIAGNOSIS — M79671 Pain in right foot: Secondary | ICD-10-CM

## 2020-12-04 ENCOUNTER — Telehealth: Payer: Self-pay | Admitting: Family Medicine

## 2020-12-04 NOTE — Telephone Encounter (Signed)
Left message for patient to call back and schedule Medicare Annual Wellness Visit (AWV) either virtually or in office.   Last AWV no information please schedule at anytime with [facility] Nurse Health Advisor 1 or 2   This should be a 45 minute visit. 

## 2020-12-30 DIAGNOSIS — M79671 Pain in right foot: Secondary | ICD-10-CM | POA: Diagnosis not present

## 2020-12-30 DIAGNOSIS — M2021 Hallux rigidus, right foot: Secondary | ICD-10-CM | POA: Diagnosis not present

## 2020-12-30 DIAGNOSIS — M79672 Pain in left foot: Secondary | ICD-10-CM | POA: Diagnosis not present

## 2020-12-30 DIAGNOSIS — M2022 Hallux rigidus, left foot: Secondary | ICD-10-CM | POA: Diagnosis not present

## 2021-01-04 ENCOUNTER — Other Ambulatory Visit: Payer: Self-pay | Admitting: Family Medicine

## 2021-01-13 DIAGNOSIS — M792 Neuralgia and neuritis, unspecified: Secondary | ICD-10-CM | POA: Diagnosis not present

## 2021-01-16 ENCOUNTER — Telehealth: Payer: Self-pay

## 2021-01-16 NOTE — Telephone Encounter (Signed)
Prior authorization completed through [facility] for Testosterone 20.25mg  1.62% gel

## 2021-01-17 MED ORDER — TESTOSTERONE 20.25 MG/ACT (1.62%) TD GEL: TRANSDERMAL | 5 refills | Status: DC

## 2021-01-17 NOTE — Telephone Encounter (Signed)
I sent in refills of his topical testosterone.

## 2021-01-19 ENCOUNTER — Other Ambulatory Visit: Payer: Self-pay | Admitting: Family Medicine

## 2021-01-19 NOTE — Telephone Encounter (Signed)
Prior authorization approved for Testosterone 20.25 mg/ gel from  12/06/2020 to 12/05/2021

## 2021-01-27 DIAGNOSIS — Z85828 Personal history of other malignant neoplasm of skin: Secondary | ICD-10-CM | POA: Diagnosis not present

## 2021-01-27 DIAGNOSIS — C44722 Squamous cell carcinoma of skin of right lower limb, including hip: Secondary | ICD-10-CM | POA: Diagnosis not present

## 2021-01-27 DIAGNOSIS — L570 Actinic keratosis: Secondary | ICD-10-CM | POA: Diagnosis not present

## 2021-01-27 DIAGNOSIS — D485 Neoplasm of uncertain behavior of skin: Secondary | ICD-10-CM | POA: Diagnosis not present

## 2021-01-27 DIAGNOSIS — L821 Other seborrheic keratosis: Secondary | ICD-10-CM | POA: Diagnosis not present

## 2021-01-27 DIAGNOSIS — C44719 Basal cell carcinoma of skin of left lower limb, including hip: Secondary | ICD-10-CM | POA: Diagnosis not present

## 2021-01-27 DIAGNOSIS — L814 Other melanin hyperpigmentation: Secondary | ICD-10-CM | POA: Diagnosis not present

## 2021-01-27 DIAGNOSIS — L905 Scar conditions and fibrosis of skin: Secondary | ICD-10-CM | POA: Diagnosis not present

## 2021-01-27 DIAGNOSIS — D225 Melanocytic nevi of trunk: Secondary | ICD-10-CM | POA: Diagnosis not present

## 2021-02-05 ENCOUNTER — Telehealth: Payer: Self-pay | Admitting: Family Medicine

## 2021-02-05 NOTE — Telephone Encounter (Signed)
Spoke with patient he was walking out [place] to go to beach will call back to  schedule Medicare Annual Wellness Visit (AWV) either virtually or in office.   AWVI please schedule at anytime with [facility] Nurse Health Advisor 1 or 2   This should be a 45 minute visit.

## 2021-02-10 ENCOUNTER — Other Ambulatory Visit: Payer: Self-pay

## 2021-02-10 ENCOUNTER — Telehealth (INDEPENDENT_AMBULATORY_CARE_PROVIDER_SITE_OTHER): Payer: Medicare HMO | Admitting: Family Medicine

## 2021-02-10 ENCOUNTER — Encounter: Payer: Self-pay | Admitting: Family Medicine

## 2021-02-10 DIAGNOSIS — R110 Nausea: Secondary | ICD-10-CM

## 2021-02-10 DIAGNOSIS — R142 Eructation: Secondary | ICD-10-CM | POA: Diagnosis not present

## 2021-02-10 DIAGNOSIS — K219 Gastro-esophageal reflux disease without esophagitis: Secondary | ICD-10-CM | POA: Diagnosis not present

## 2021-02-10 NOTE — Progress Notes (Signed)
Patient ID: Daniel Sullivan, male   DOB: September 29, 1951, 70 y.o.   MRN: [id number]  This visit type was conducted due to national recommendations for restrictions regarding the COVID-19 pandemic in an effort to limit this patient's exposure and mitigate transmission in our community.   Virtual Visit via Telephone Note  I connected with Daniel Sullivan on 02/10/21 at  8:45 AM EST by telephone and verified that I am speaking with the correct person using two identifiers.   I discussed the limitations, risks, security and privacy concerns of performing an evaluation and management service by telephone and the availability of in person appointments. I also discussed with the patient that there may be a patient responsible charge related to this service. The patient expressed understanding and agreed to proceed.  Location patient: home Location provider: work or home office Participants present for the call: patient, provider Patient did not have a visit in the prior 7 days to address this/these issue(s).   History of Present Illness: Daniel Sullivan had called with really almost a month of having some intermittent nausea without vomiting.  He has actually had some weight gain though over the past couple months.  3 days ago he had one episode of diarrhea but none since then.  Has had mostly belching and has had some GERD symptoms.  He takes omeprazole 20 mg daily at baseline and he increase this to twice daily few days ago.  He thinks this may be helping some.  He denies any abdominal pain.  No bloody stools.  No localizing abdominal pain.  He has no respiratory symptoms whatsoever.  No fever.  Still eating and drinking and no relation to food.  No clear triggers.  Past Medical History:  Diagnosis Date  . Allergy   . Arthritis   . Asthma   . 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 Diverticulosis   . Elevated PSA   . GERD (gastroesophageal reflux disease)   . Hemorrhoids   . Hx of  adenomatous colonic polyps   . Hyperlipidemia   . Hypertension   . Hypogonadism in male    Past Surgical History:  Procedure Laterality Date  . ANAL RECTAL MANOMETRY N/A 09/07/2017   Procedure: ANO RECTAL MANOMETRY;  Surgeon: Mauri Pole, MD;  Location: [facility];  Service: Endoscopy;  Laterality: N/A;  . COLONOSCOPY W/ BIOPSIES    . HEMORRHOID BANDING      reports that he quit smoking about 32 years ago. His smoking use included cigarettes. He has a 7.50 pack-year smoking history. He has never used smokeless tobacco. He reports current alcohol use. He reports that he does not use drugs. family history includes Alcohol abuse in his father; Melanoma (age of onset: 35) in his brother; Suicidality in his father. No Known Allergies    Observations/Objective: Patient sounds cheerful and well on the phone. I do not appreciate any SOB. Speech and thought processing are grossly intact. Patient reported vitals:  Assessment and Plan:  Patient relates some mild nausea and recent increased GERD symptoms and belching.  We agree that his symptoms do not in any way sound consistent with Covid.  We did not see any reason to get tested at this point.  We have recommend he increase his omeprazole to 20 mg twice daily for the next week and if symptoms not fully resolving with that be in touch.  He will then try to taper back down. -We recommend an office  follow-up if not improving in 1 week  Follow Up Instructions:  -As above   99441 5-10 [id number] 11-20 99443 21-30 I did not refer this patient for an OV in the next 24 hours for this/these issue(s).  I discussed the assessment and treatment plan with the patient. The patient was provided an opportunity to ask questions and all were answered. The patient agreed with the plan and demonstrated an understanding of the instructions.   The patient was advised to call back or seek an in-person evaluation if the symptoms worsen or if the condition  fails to improve as anticipated.  I provided 21 minutes of non-face-to-face time during this encounter.   Carolann Littler, MD

## 2021-03-09 ENCOUNTER — Other Ambulatory Visit: Payer: Self-pay | Admitting: Family Medicine

## 2021-03-09 NOTE — Telephone Encounter (Signed)
He does need follow up lipids.  Confirm if he has been taking,   If so, OK to refill but recommend follow up lipid and hepatic in 2 months.

## 2021-03-09 NOTE — Telephone Encounter (Signed)
Please advise. Rx last filled in 2020. Should the patient continue this?

## 2021-03-11 ENCOUNTER — Encounter: Payer: Self-pay | Admitting: Internal Medicine

## 2021-03-11 ENCOUNTER — Other Ambulatory Visit: Payer: Medicare HMO

## 2021-03-11 ENCOUNTER — Ambulatory Visit: Payer: Medicare HMO | Admitting: Internal Medicine

## 2021-03-11 ENCOUNTER — Other Ambulatory Visit: Payer: Self-pay

## 2021-03-11 VITALS — BP 146/94 | HR 88 | Ht 69.0 in | Wt 183.4 lb

## 2021-03-11 DIAGNOSIS — K580 Irritable bowel syndrome with diarrhea: Secondary | ICD-10-CM | POA: Diagnosis not present

## 2021-03-11 DIAGNOSIS — K529 Noninfective gastroenteritis and colitis, unspecified: Secondary | ICD-10-CM

## 2021-03-11 DIAGNOSIS — K219 Gastro-esophageal reflux disease without esophagitis: Secondary | ICD-10-CM

## 2021-03-11 MED ORDER — DIPHENOXYLATE-ATROPINE 2.5-0.025 MG PO TABS: ORAL_TABLET | ORAL | 0 refills | Status: DC

## 2021-03-11 NOTE — Progress Notes (Signed)
Daniel Sullivan 70 y.o. [birth year] [id number]  Assessment & Plan:   Encounter Diagnoses  Name Primary?  . Chronic diarrhea Yes  . Irritable bowel syndrome with diarrhea   . Gastroesophageal reflux disease, unspecified whether esophagitis present     I am going to check a fecal elastase question pancreatic insufficiency, and also check fecal calprotectin.  Perhaps he has Crohn's disease that has not been identified.  It would be small intestine most likely.  I explained how it seems rational to increase PPI when belching increases but PPI will reduce acid but not really stop belching it only treats heartburn.  Other considerations would be a gastrin level though that would seem to be very unlikely for him to have Zollinger-Ellison syndrome, consider small intestinal bacterial overgrowth breath testing.  Further plans pending the results as above.  In the meantime since loperamide at 4 tablets a day is not really helping I have prescribed diphenoxylate and atropine.  We may consider retrying Prevalite perhaps that was true true unrelated with the increase in stools that is very puzzling to me I have never seen that in a patient but I do not deny it happened.  Orders Placed This Encounter  Procedures  . Calprotectin, Fecal  . Pancreatic Elastase, Fecal   Meds ordered this encounter  Medications  . diphenoxylate-atropine (LOMOTIL) 2.5-0.025 MG tablet    Sig: 1-2 tabs as needed up to 4 times a day    Dispense:  90 tablet    Refill:  0   I appreciate the opportunity to care for this patient. 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  He had been doing well on Imodium 1 or 2  at bedtime he added 2 in the morning but things still are not right.  He also had a flare in belching and his omeprazole was increased to 80 mg a day but that did not make a difference.  His heartburn seems to be under control.  He does think that when his belching or reflux flares he has a flare of his lower abdominal and bowel symptoms as well.  His most recent visit was last year about a year ago where he had a colonoscopy with negative random biopsies and a normal terminal ileum.  Did have diverticulosis.  I tried him on Prevalite but he said that made him go to the bathroom more.  He settled in on his Imodium regimen is done well since last year until the last month or so.  There is no new medication or diet changes. No Known Allergies Current Meds  Medication Sig  . amLODipine (NORVASC) 5 MG tablet Take 1 tablet (5 mg total) by mouth daily.  . diphenoxylate-atropine (LOMOTIL) 2.5-0.025 MG tablet 1-2 tabs as needed up to 4 times a day  . fluticasone (CUTIVATE) 0.005 % ointment Apply BID to face PRN. Apply 2nd  . fluticasone (FLONASE) 50 MCG/ACT nasal spray USE 2 SPRAYS INTO BOTH NOSTRILS DAILY.  Marland Kitchen gabapentin (NEURONTIN) 300 MG capsule Take 1 capsule by mouth in the morning and at bedtime.  . Glucosamine-Chondroit-Vit C-Mn TABS Take 1 tablet by mouth daily.  . hydrocortisone 2.5 % cream apply bid prn irritation  .  losartan (COZAAR) 50 MG tablet TAKE 1 TABLET BY MOUTH EVERY DAY  . Multiple Vitamins-Minerals (MENS MULTI VITAMIN & MINERAL PO) Take 1 tablet by mouth daily.  . mupirocin ointment (BACTROBAN) 2 % Apply  a small amount to affected area twice a day  Apply 1st  . omeprazole (PRILOSEC) 20 MG capsule TAKE 1 CAPSULE BY MOUTH EVERY DAY  . pramipexole (MIRAPEX) 0.125 MG tablet TAKE ONE TO TWO TABLETS AT NIGHT AS NEEDED FOR RESTLESS LEGS.  . pravastatin (PRAVACHOL) 40 MG tablet TAKE 1 TABLET BY MOUTH EVERY DAY  . Testosterone 20.25 MG/ACT (1.62%) GEL Apply 1 pump spray per arm once daily early  morning  . triamcinolone cream (KENALOG) 0.1 % Apply BID to affected areas as needed for itching. Avoid face, groin, underarms  . vitamin C (ASCORBIC ACID) 500 MG tablet Take 500 mg by mouth daily.  . [DISCONTINUED] loperamide (IMODIUM) 2 MG capsule Take 2 mg by mouth in the morning and at bedtime.   Past Medical History:  Diagnosis Date  . Allergy   . Arthritis   . Asthma   . 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 Diverticulosis   . Elevated PSA   . GERD (gastroesophageal reflux disease)   . Hemorrhoids   . Hx of adenomatous colonic polyps   . Hyperlipidemia   . Hypertension   . Hypogonadism in male    Past Surgical History:  Procedure Laterality Date  . ANAL RECTAL MANOMETRY N/A 09/07/2017   Procedure: ANO RECTAL MANOMETRY;  Surgeon: Mauri Pole, MD;  Location: [facility];  Service: Endoscopy;  Laterality: N/A;  . COLONOSCOPY W/ BIOPSIES    . HEMORRHOID BANDING     Social History   Social History Narrative   Married, retired   Former smoker   + [place], no drugs   family history includes Alcohol abuse in his father; Melanoma (age of onset: 12) in his brother; Suicidality in his father.   Review of Systems As per HPI Objective:   Physical Exam BP (!) 146/94 (BP Location: Left Arm, Patient Position: Sitting, Cuff Size: Normal)   Pulse 88   Ht 5\' 9"  (1.753 m)   Wt 183 lb 6 oz (83.2 kg)   BMI 27.08 kg/m  Well-developed well-nourished white man in no acute distress the abdomen is soft nontender and mildly obese

## 2021-03-11 NOTE — Patient Instructions (Signed)
Go to the basement for the stool tests - turn in today if able or bring it back when convenient. Checking for inflammation and pancreatic insufficiency.  Use the diphenoxylate and atropine instead of Imodium AD. Try 1 at bedtime to start.  I will be in touch when stool tests are resulted that can take 1-2 weeks from the time they are submitted.  I appreciate the opportunity to care for you. Gatha Mayer, MD, [facility]

## 2021-03-14 LAB — CALPROTECTIN, FECAL: Calprotectin, Fecal: 170 ug/g — ABNORMAL HIGH (ref 0–120)

## 2021-03-17 DIAGNOSIS — C44722 Squamous cell carcinoma of skin of right lower limb, including hip: Secondary | ICD-10-CM | POA: Diagnosis not present

## 2021-03-17 DIAGNOSIS — C44719 Basal cell carcinoma of skin of left lower limb, including hip: Secondary | ICD-10-CM | POA: Diagnosis not present

## 2021-03-17 LAB — PANCREATIC ELASTASE, FECAL: Pancreatic Elastase-1, Stool: 257 mcg/g

## 2021-04-07 ENCOUNTER — Other Ambulatory Visit: Payer: Self-pay

## 2021-04-07 DIAGNOSIS — R195 Other fecal abnormalities: Secondary | ICD-10-CM

## 2021-04-07 DIAGNOSIS — K529 Noninfective gastroenteritis and colitis, unspecified: Secondary | ICD-10-CM

## 2021-04-09 ENCOUNTER — Telehealth: Payer: Self-pay | Admitting: Family Medicine

## 2021-04-09 DIAGNOSIS — R7989 Other specified abnormal findings of blood chemistry: Secondary | ICD-10-CM

## 2021-04-09 NOTE — Telephone Encounter (Signed)
Pt  Would like an order for lab results . Pt stated he wanted to test his testosterone pt would like a from nurse

## 2021-04-10 ENCOUNTER — Encounter: Payer: Self-pay | Admitting: Family Medicine

## 2021-04-10 ENCOUNTER — Telehealth: Payer: Self-pay | Admitting: Internal Medicine

## 2021-04-10 NOTE — Telephone Encounter (Signed)
Spoke with the patient. He asked to schedule an appointment with Dr. Elease Hashimoto the same day he has labs done. A appointment has been scheduled. Nothing further needed.

## 2021-04-10 NOTE — Telephone Encounter (Signed)
Labs have been ordered

## 2021-04-10 NOTE — Telephone Encounter (Signed)
Patients questions answered

## 2021-04-10 NOTE — Telephone Encounter (Signed)
Inbound call from patient with questions about the capsule endo procedure.

## 2021-04-14 ENCOUNTER — Ambulatory Visit (INDEPENDENT_AMBULATORY_CARE_PROVIDER_SITE_OTHER): Payer: Medicare HMO | Admitting: Family Medicine

## 2021-04-14 ENCOUNTER — Other Ambulatory Visit: Payer: Self-pay

## 2021-04-14 ENCOUNTER — Encounter: Payer: Self-pay | Admitting: Family Medicine

## 2021-04-14 VITALS — BP 144/72 | HR 94 | Temp 97.6°F | Wt 179.6 lb

## 2021-04-14 DIAGNOSIS — R42 Dizziness and giddiness: Secondary | ICD-10-CM

## 2021-04-14 DIAGNOSIS — R5383 Other fatigue: Secondary | ICD-10-CM

## 2021-04-14 DIAGNOSIS — R7989 Other specified abnormal findings of blood chemistry: Secondary | ICD-10-CM | POA: Diagnosis not present

## 2021-04-14 LAB — CBC WITH DIFFERENTIAL/PLATELET
Basophils Absolute: 0 10*3/uL (ref 0.0–0.1)
Basophils Relative: 0.3 % (ref 0.0–3.0)
Eosinophils Absolute: 0.1 10*3/uL (ref 0.0–0.7)
Eosinophils Relative: 1.9 % (ref 0.0–5.0)
HCT: 44.2 % (ref 39.0–52.0)
Hemoglobin: 14.6 g/dL (ref 13.0–17.0)
Lymphocytes Relative: 14.9 % (ref 12.0–46.0)
Lymphs Abs: 1 10*3/uL (ref 0.7–4.0)
MCHC: 33.1 g/dL (ref 30.0–36.0)
MCV: 94.3 fl (ref 78.0–100.0)
Monocytes Absolute: 0.6 10*3/uL (ref 0.1–1.0)
Monocytes Relative: 9 % (ref 3.0–12.0)
Neutro Abs: 4.8 10*3/uL (ref 1.4–7.7)
Neutrophils Relative %: 73.9 % (ref 43.0–77.0)
Platelets: 190 10*3/uL (ref 150.0–400.0)
RBC: 4.68 Mil/uL (ref 4.22–5.81)
RDW: 13.3 % (ref 11.5–15.5)
WBC: 6.5 10*3/uL (ref 4.0–10.5)

## 2021-04-14 LAB — TESTOSTERONE: Testosterone: 160 ng/dL — ABNORMAL LOW (ref 300.00–890.00)

## 2021-04-14 NOTE — Progress Notes (Signed)
Established Patient Office Visit  Subjective:  Patient ID: Daniel Sullivan, male    DOB: [birth year]  Age: 70 y.o. MRN: [id number]  CC:  Chief Complaint  Patient presents with  . Follow-up    Vertigo, pt states he had a short period where he just didn't feel good and had dizzy spells, he would like to discuss this with Dr. Elease Hashimoto directly.     HPI Daniel Sullivan presents for recent intermittent dizziness.  He denies any vertigo symptoms which was listed as chief complaint above.  He states he had some episodes recently where he had some lightheadedness with activity such as climbing but no syncope.  No chest pains.  No consistent dyspnea with exercise.  He generally stays very active but has been more inactive past few weeks.  He recently was at [facility] fast and tried to go up a couple of inclines about an hour after eating and noticed a little bit of dizziness.  This is very transient.  No palpitations.  Echocardiogram 2013 reviewed.  No major aortic valve or other valvular abnormalities.  His chronic problems include history of hypertension, GERD, IBS, low testosterone.  He recent went back on topical testosterone therapy.  He had called requesting follow-up testosterone levels.  Recent PSA in August 4 0.2 which compares with 5.0 12/20  Past Medical History:  Diagnosis Date  . Allergy   . Arthritis   . Asthma   . 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 Diverticulosis   . Elevated PSA   . GERD (gastroesophageal reflux disease)   . Hemorrhoids   . Hx of adenomatous colonic polyps   . Hyperlipidemia   . Hypertension   . Hypogonadism in male     Past Surgical History:  Procedure Laterality Date  . ANAL RECTAL MANOMETRY N/A 09/07/2017   Procedure: ANO RECTAL MANOMETRY;  Surgeon: Mauri Pole, MD;  Location: [facility];  Service: Endoscopy;  Laterality: N/A;  . COLONOSCOPY W/ BIOPSIES    . HEMORRHOID BANDING      Family History  Problem Relation  Age of Onset  . Alcohol abuse Father   . Suicidality Father   . Melanoma Brother 68  . Stomach cancer Neg Hx   . Colon cancer Neg Hx   . Rectal cancer Neg Hx   . Esophageal cancer Neg Hx     Social History   Socioeconomic History  . Marital status: Married    Spouse name: Not on file  . Number of children: Not on file  . Years of education: Not on file  . Highest education level: Not on file  Occupational History  . Occupation: retired  Tobacco Use  . Smoking status: Former Smoker    Packs/day: 0.50    Years: 15.00    Pack years: 7.50    Types: Cigarettes    Quit date: 02/24/1988    Years since quitting: 33.1  . Smokeless tobacco: Never Used  Vaping Use  . Vaping Use: Never used  Substance and Sexual Activity  . Alcohol use: Yes    Comment: occasionally  . Drug use: No  . Sexual activity: Yes    Partners: Female  Other Topics Concern  . Not on file  Social History Narrative   Married, retired   Former smoker   + [place], no drugs   Social Determinants of Radio broadcast assistant Strain: Not on [employer] Insecurity: Not on file  Transportation Needs: Not on file  Physical Activity: Not on file  Stress: Not on file  Social Connections: Not on file  Intimate Partner Violence: Not on file    Outpatient Medications Prior to Visit  Medication Sig Dispense Refill  . amLODipine (NORVASC) 5 MG tablet Take 1 tablet (5 mg total) by mouth daily. 90 tablet 2  . diphenoxylate-atropine (LOMOTIL) 2.5-0.025 MG tablet 1-2 tabs as needed up to 4 times a day 90 tablet 0  . fluticasone (CUTIVATE) 0.005 % ointment Apply BID to face PRN. Apply 2nd    . fluticasone (FLONASE) 50 MCG/ACT nasal spray USE 2 SPRAYS INTO BOTH NOSTRILS DAILY. 16 g 0  . gabapentin (NEURONTIN) 300 MG capsule Take 1 capsule by mouth in the morning and at bedtime.    . Glucosamine-Chondroit-Vit C-Mn TABS Take 1 tablet by mouth daily.    . hydrocortisone 2.5 % cream apply bid prn irritation    . losartan  (COZAAR) 50 MG tablet TAKE 1 TABLET BY MOUTH EVERY DAY 90 tablet 3  . Multiple Vitamins-Minerals (MENS MULTI VITAMIN & MINERAL PO) Take 1 tablet by mouth daily.    . mupirocin ointment (BACTROBAN) 2 % Apply  a small amount to affected area twice a day  Apply 1st    . omeprazole (PRILOSEC) 20 MG capsule TAKE 1 CAPSULE BY MOUTH EVERY DAY 90 capsule 1  . pramipexole (MIRAPEX) 0.125 MG tablet TAKE ONE TO TWO TABLETS AT NIGHT AS NEEDED FOR RESTLESS LEGS. 180 tablet 1  . pravastatin (PRAVACHOL) 40 MG tablet TAKE 1 TABLET BY MOUTH EVERY DAY 90 tablet 0  . Testosterone 20.25 MG/ACT (1.62%) GEL Apply 1 pump spray per arm once daily early morning 75 g 5  . triamcinolone cream (KENALOG) 0.1 % Apply BID to affected areas as needed for itching. Avoid face, groin, underarms    . vitamin C (ASCORBIC ACID) 500 MG tablet Take 500 mg by mouth daily.     No facility-administered medications prior to visit.    No Known Allergies  ROS Review of Systems  Constitutional: Positive for fatigue. Negative for appetite change, chills, fever and unexpected weight change.  Respiratory: Negative for shortness of breath.   Cardiovascular: Negative for chest pain.  Gastrointestinal: Negative for abdominal pain.  Genitourinary: Negative for dysuria.  Neurological: Positive for dizziness and light-headedness. Negative for syncope, speech difficulty and weakness.  Hematological: Negative for adenopathy.  Psychiatric/Behavioral: Negative for confusion.      Objective:    Physical Exam Vitals reviewed.  Constitutional:      Appearance: Normal appearance.  Cardiovascular:     Rate and Rhythm: Normal rate and regular rhythm.     Heart sounds: No murmur heard.   Pulmonary:     Effort: Pulmonary effort is normal.     Breath sounds: Normal breath sounds.  Musculoskeletal:     Right lower leg: No edema.     Left lower leg: No edema.  Neurological:     General: No focal deficit present.     Mental Status: He is  alert.     Cranial Nerves: No cranial nerve deficit.     Motor: No weakness.     BP (!) 144/72 (BP Location: Left Arm, Patient Position: Sitting, Cuff Size: Normal)   Pulse 94   Temp 97.6 F (36.4 C) (Oral)   Wt 179 lb 9.6 oz (81.5 kg)   SpO2 96%   BMI 26.52 kg/m  Wt Readings from Last 3 Encounters:  04/14/21 179 lb  9.6 oz (81.5 kg)  03/11/21 183 lb 6 oz (83.2 kg)  08/18/20 178 lb (80.7 kg)     There are no preventive care reminders to display for this patient.  There are no preventive care reminders to display for this patient.  Lab Results  Component Value Date   TSH 2.01 12/04/2019   Lab Results  Component Value Date   WBC CANCELED 08/04/2020   HGB 13.4 12/04/2019   HCT 40.2 12/04/2019   MCV 95.0 12/04/2019   PLT 184.0 12/04/2019   Lab Results  Component Value Date   NA 139 08/04/2020   K 4.3 08/04/2020   CO2 31 08/04/2020   GLUCOSE 111 (H) 08/04/2020   BUN 10 08/04/2020   CREATININE 0.81 08/04/2020   BILITOT 0.5 12/04/2019   ALKPHOS 57 12/04/2019   AST 26 12/04/2019   ALT 19 12/04/2019   PROT 7.0 12/04/2019   ALBUMIN 4.4 12/04/2019   CALCIUM 8.9 08/04/2020   GFR 92.94 12/04/2019   Lab Results  Component Value Date   CHOL 201 (H) 12/04/2019   Lab Results  Component Value Date   HDL 58.70 12/04/2019   Lab Results  Component Value Date   LDLCALC 121 (H) 12/04/2019   Lab Results  Component Value Date   TRIG 104.0 12/04/2019   Lab Results  Component Value Date   CHOLHDL 3 12/04/2019   Lab Results  Component Value Date   HGBA1C 5.9 (A) 02/29/2020      Assessment & Plan:   #1 dizziness/lightheadedness.  This occurred recently couple occasions with mild activity.  This did occur about an hour after eating and question postprandial hypotension.  Denies any associated chest pain.  Blood pressure today standing is very similar compared with seated reading with no orthostatic change  -Check CBC -Stay well-hydrated -We discussed possible  further work-up with things like cardiac event monitoring, repeat echo, etc. but at this point he wishes to observe -Avoid heavy exertion within a couple hours of eating  #3 low testosterone.  Patient on topical replacement.  -Recheck CBC and total testosterone level. -If he remains on  Replacement will also need repeat PSA within the next year  No orders of the defined types were placed in this encounter.   Follow-up: No follow-ups on file.    Carolann Littler, MD

## 2021-04-14 NOTE — Telephone Encounter (Signed)
Patient calling back stating has additional information for you in regards to his procedure and is requesting a call from a nurse please.

## 2021-04-14 NOTE — Telephone Encounter (Signed)
See patient message for more information. Capsule endoscopy was rescheduled to 04/30/21 due to waiting on authorization from insurance company.

## 2021-04-15 ENCOUNTER — Other Ambulatory Visit: Payer: Self-pay

## 2021-04-15 ENCOUNTER — Ambulatory Visit: Payer: Medicare HMO | Admitting: Family Medicine

## 2021-04-15 MED ORDER — TESTOSTERONE 20.25 MG/ACT (1.62%) TD GEL: TRANSDERMAL | 5 refills | Status: DC

## 2021-04-17 ENCOUNTER — Other Ambulatory Visit: Payer: Self-pay | Admitting: Family Medicine

## 2021-04-28 ENCOUNTER — Other Ambulatory Visit: Payer: Self-pay

## 2021-04-28 ENCOUNTER — Other Ambulatory Visit: Payer: Self-pay | Admitting: Family Medicine

## 2021-04-28 ENCOUNTER — Encounter: Payer: Self-pay | Admitting: Family Medicine

## 2021-04-29 ENCOUNTER — Encounter: Payer: Self-pay | Admitting: Family Medicine

## 2021-04-29 ENCOUNTER — Ambulatory Visit (INDEPENDENT_AMBULATORY_CARE_PROVIDER_SITE_OTHER): Payer: Medicare HMO | Admitting: Family Medicine

## 2021-04-29 ENCOUNTER — Telehealth: Payer: Self-pay | Admitting: Family Medicine

## 2021-04-29 VITALS — BP 120/60 | HR 94 | Temp 97.8°F | Wt 176.3 lb

## 2021-04-29 DIAGNOSIS — R7989 Other specified abnormal findings of blood chemistry: Secondary | ICD-10-CM | POA: Diagnosis not present

## 2021-04-29 DIAGNOSIS — I10 Essential (primary) hypertension: Secondary | ICD-10-CM

## 2021-04-29 DIAGNOSIS — N529 Male erectile dysfunction, unspecified: Secondary | ICD-10-CM | POA: Diagnosis not present

## 2021-04-29 MED ORDER — SILDENAFIL CITRATE 100 MG PO TABS: 50.0000 mg | ORAL_TABLET | Freq: Every day | ORAL | 5 refills | Status: DC | PRN

## 2021-04-29 MED ORDER — TESTOSTERONE 20.25 MG/ACT (1.62%) TD GEL: TRANSDERMAL | 5 refills | Status: DC

## 2021-04-29 NOTE — Progress Notes (Signed)
Established Patient Office Visit  Subjective:  Patient ID: Daniel Sullivan, male    DOB: November 12, 1951  Age: 70 y.o. MRN: [id number]  CC: No chief complaint on file.   HPI Daniel Sullivan presents for follow-up regarding recent visit.  He presented with some dizziness and lightheadedness.  This seems to be more postprandial in nature.  We checked a CBC which came back normal.  He is on testosterone replacement and rechecked levels and these remained low.  We recommend he increase his testosterone gel to 2 pumps sprays per arm once daily.  He started this couple weeks ago.  He relates some issues with erectile dysfunction.  He has a new relationship and has never taken medication such as Viagra in the past.  He would like to consider.  No nitroglycerin use.  He does exercise regularly.  Non-smoker.  Does have nocturnal erections.  No recent chest pains.  Blood pressure was elevated last visit but improved today.  Past Medical History:  Diagnosis Date  . Allergy   . Arthritis   . Asthma   . 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 Diverticulosis   . Elevated PSA   . GERD (gastroesophageal reflux disease)   . Hemorrhoids   . Hx of adenomatous colonic polyps   . Hyperlipidemia   . Hypertension   . Hypogonadism in male     Past Surgical History:  Procedure Laterality Date  . ANAL RECTAL MANOMETRY N/A 09/07/2017   Procedure: ANO RECTAL MANOMETRY;  Surgeon: Mauri Pole, MD;  Location: [facility];  Service: Endoscopy;  Laterality: N/A;  . COLONOSCOPY W/ BIOPSIES    . HEMORRHOID BANDING      Family History  Problem Relation Age of Onset  . Alcohol abuse Father   . Suicidality Father   . Melanoma Brother 75  . Stomach cancer Neg Hx   . Colon cancer Neg Hx   . Rectal cancer Neg Hx   . Esophageal cancer Neg Hx     Social History   Socioeconomic History  . Marital status: Married    Spouse name: Not on file  . Number of children: Not on file  . Years of  education: Not on file  . Highest education level: Not on file  Occupational History  . Occupation: retired  Tobacco Use  . Smoking status: Former Smoker    Packs/day: 0.50    Years: 15.00    Pack years: 7.50    Types: Cigarettes    Quit date: 02/24/1988    Years since quitting: 33.2  . Smokeless tobacco: Never Used  Vaping Use  . Vaping Use: Never used  Substance and Sexual Activity  . Alcohol use: Yes    Comment: occasionally  . Drug use: No  . Sexual activity: Yes    Partners: Female  Other Topics Concern  . Not on file  Social History Narrative   Married, retired   Former smoker   + [place], no drugs   Social Determinants of Radio broadcast assistant Strain: Not on file  Food Insecurity: Not on file  Transportation Needs: Not on file  Physical Activity: Not on file  Stress: Not on file  Social Connections: Not on file  Intimate Partner Violence: Not on file    Outpatient Medications Prior to Visit  Medication Sig Dispense Refill  . amLODipine (NORVASC) 5 MG tablet Take 1 tablet (5 mg total) by mouth daily. 90 tablet  2  . diphenoxylate-atropine (LOMOTIL) 2.5-0.025 MG tablet 1-2 tabs as needed up to 4 times a day 90 tablet 0  . fluticasone (CUTIVATE) 0.005 % ointment Apply BID to face PRN. Apply 2nd    . fluticasone (FLONASE) 50 MCG/ACT nasal spray USE 2 SPRAYS INTO BOTH NOSTRILS DAILY. 16 g 0  . gabapentin (NEURONTIN) 300 MG capsule Take 1 capsule by mouth in the morning and at bedtime.    . Glucosamine-Chondroit-Vit C-Mn TABS Take 1 tablet by mouth daily.    . hydrocortisone 2.5 % cream apply bid prn irritation    . losartan (COZAAR) 50 MG tablet TAKE 1 TABLET BY MOUTH EVERY DAY 90 tablet 3  . Multiple Vitamins-Minerals (MENS MULTI VITAMIN & MINERAL PO) Take 1 tablet by mouth daily.    . mupirocin ointment (BACTROBAN) 2 % Apply  a small amount to affected area twice a day  Apply 1st    . omeprazole (PRILOSEC) 20 MG capsule TAKE 1 CAPSULE BY MOUTH EVERY DAY 90  capsule 1  . pramipexole (MIRAPEX) 0.125 MG tablet TAKE ONE TO TWO TABLETS AT NIGHT AS NEEDED FOR RESTLESS LEGS. 180 tablet 1  . pravastatin (PRAVACHOL) 40 MG tablet TAKE 1 TABLET BY MOUTH EVERY DAY 90 tablet 0  . triamcinolone cream (KENALOG) 0.1 % Apply BID to affected areas as needed for itching. Avoid face, groin, underarms    . vitamin C (ASCORBIC ACID) 500 MG tablet Take 500 mg by mouth daily.    . Testosterone 20.25 MG/ACT (1.62%) GEL Apply 2 pump spray per arm once daily early morning 75 g 5   No facility-administered medications prior to visit.    No Known Allergies  ROS Review of Systems  Constitutional: Negative for fatigue.  Eyes: Negative for visual disturbance.  Respiratory: Negative for cough, chest tightness and shortness of breath.   Cardiovascular: Negative for chest pain, palpitations and leg swelling.  Neurological: Negative for dizziness, syncope, weakness, light-headedness and headaches.      Objective:    Physical Exam Constitutional:      Appearance: He is well-developed.  HENT:     Right Ear: External ear normal.     Left Ear: External ear normal.  Eyes:     Pupils: Pupils are equal, round, and reactive to light.  Neck:     Thyroid: No thyromegaly.  Cardiovascular:     Rate and Rhythm: Normal rate and regular rhythm.  Pulmonary:     Effort: Pulmonary effort is normal. No respiratory distress.     Breath sounds: Normal breath sounds. No wheezing or rales.  Musculoskeletal:     Cervical back: Neck supple.  Neurological:     Mental Status: He is alert and oriented to person, place, and time.     BP 120/60 (BP Location: Left Arm, Patient Position: Sitting, Cuff Size: Normal)   Pulse 94   Temp 97.8 F (36.6 C) (Oral)   Wt 176 lb 4.8 oz (80 kg)   SpO2 97%   BMI 26.03 kg/m  Wt Readings from Last 3 Encounters:  04/29/21 176 lb 4.8 oz (80 kg)  04/14/21 179 lb 9.6 oz (81.5 kg)  03/11/21 183 lb 6 oz (83.2 kg)     There are no preventive care  reminders to display for this patient.  There are no preventive care reminders to display for this patient.  Lab Results  Component Value Date   TSH 2.01 12/04/2019   Lab Results  Component Value Date   WBC 6.5 04/14/2021  HGB 14.6 04/14/2021   HCT 44.2 04/14/2021   MCV 94.3 04/14/2021   PLT 190.0 04/14/2021   Lab Results  Component Value Date   NA 139 08/04/2020   K 4.3 08/04/2020   CO2 31 08/04/2020   GLUCOSE 111 (H) 08/04/2020   BUN 10 08/04/2020   CREATININE 0.81 08/04/2020   BILITOT 0.5 12/04/2019   ALKPHOS 57 12/04/2019   AST 26 12/04/2019   ALT 19 12/04/2019   PROT 7.0 12/04/2019   ALBUMIN 4.4 12/04/2019   CALCIUM 8.9 08/04/2020   GFR 92.94 12/04/2019   Lab Results  Component Value Date   CHOL 201 (H) 12/04/2019   Lab Results  Component Value Date   HDL 58.70 12/04/2019   Lab Results  Component Value Date   LDLCALC 121 (H) 12/04/2019   Lab Results  Component Value Date   TRIG 104.0 12/04/2019   Lab Results  Component Value Date   CHOLHDL 3 12/04/2019   Lab Results  Component Value Date   HGBA1C 5.9 (A) 02/29/2020      Assessment & Plan:   #1 hypertension.  Elevated last visit but back down to normal range today with reading 120/60. -Continue current medications with amlodipine 5 mg daily and losartan 50 mg daily.  Continue regular exercise habits.  #2 erectile dysfunction. -We discussed trial of Viagra 100 mg 1/2 to 1 tablet daily as needed.  He has no contraindications for use.  We discussed potential side effects.  #3 low testosterone.  Reviewed recent labs.  Increase testosterone 1.62% pump spray to 2 pump sprays per arm once daily with refill sent to his pharmacy. -Plan 71-month follow-up.  Recheck lipid panel and total testosterone levels then.  Also recheck PSA at that time  Meds ordered this encounter  Medications  . Testosterone 20.25 MG/ACT (1.62%) GEL    Sig: Apply 2 pump spray per arm once daily early morning    Dispense:  75  g    Refill:  5  . sildenafil (VIAGRA) 100 MG tablet    Sig: Take 0.5-1 tablets (50-100 mg total) by mouth daily as needed for erectile dysfunction.    Dispense:  10 tablet    Refill:  5    Follow-up: Return in about 2 months (around 06/29/2021).    Carolann Littler, MD

## 2021-04-29 NOTE — Telephone Encounter (Signed)
sildenafil (VIAGRA) 100 MG tablet  This medication is not covered by insurance and needs an alternative medication sent to the pharmacy.  [facility] [id number] [place], [place] Phone:  [phone]  Fax:  [phone]

## 2021-04-29 NOTE — Patient Instructions (Signed)
Erectile Dysfunction Erectile dysfunction (ED) is the inability to get or keep an erection in order to have sexual intercourse. ED is considered a symptom of an underlying disorder and not considered a disease. Erectile dysfunction may include:  Inability to get an erection.  Lack of enough hardness of the erection to allow penetration.  Loss of the erection before sex is finished. What are the causes? This condition may be caused by:  Certain medicines, such as: ? Pain relievers. ? Antihistamines. ? Antidepressants. ? Blood pressure medicines. ? Water pills (diuretics). ? Ulcer medicines. ? Muscle relaxants. ? Drugs.  Excessive drinking.  Psychological causes, such as: ? Anxiety. ? Depression. ? Sadness. ? Exhaustion. ? Performance fear. ? Stress.  Physical causes, such as: ? Artery problems. This may include diabetes, smoking, liver disease, or atherosclerosis. ? High blood pressure. ? Hormonal problems, such as low testosterone. ? Obesity. ? Nerve problems. This may include back or pelvic injuries, diabetes mellitus, multiple sclerosis, or Parkinson's disease. What are the signs or symptoms? Symptoms of this condition include:  Inability to get an erection.  Lack of enough hardness of the erection to allow penetration.  Loss of the erection before sex is finished.  Normal erections at some times, but with frequent unsatisfactory episodes.  Low sexual satisfaction in either partner due to erection problems.  A curved penis occurring with erection. The curve may cause pain or the penis may be too curved to allow for intercourse.  Never having nighttime erections. How is this diagnosed? This condition is often diagnosed by:  Performing a physical exam to find other diseases or specific problems with the penis.  Asking you detailed questions about the problem.  Performing blood tests to check for diabetes mellitus or to measure hormone levels.  Performing  other tests to check for underlying health conditions.  Performing an ultrasound exam to check for scarring.  Performing a test to check blood flow to the penis.  Doing a sleep study at home to measure nighttime erections. How is this treated? This condition may be treated by:  Medicine taken by mouth to help you achieve an erection (oral medicine).  Hormone replacement therapy to replace low testosterone levels.  Medicine that is injected into the penis. Your health care provider may instruct you how to give yourself these injections at home.  Vacuum pump. This is a pump with a ring on it. The pump and ring are placed on the penis and used to create pressure that helps the penis become erect.  Penile implant surgery. In this procedure, you may receive: ? An inflatable implant. This consists of cylinders, a pump, and a reservoir. The cylinders can be inflated with a fluid that helps to create an erection, and they can be deflated after intercourse. ? A semi-rigid implant. This consists of two silicone rubber rods. The rods provide some rigidity. They are also flexible, so the penis can both curve downward in its normal position and become straight for sexual intercourse.  Blood vessel surgery, to improve blood flow to the penis. During this procedure, a blood vessel from a different part of the body is placed into the penis to allow blood to flow around (bypass) damaged or blocked blood vessels.  Lifestyle changes, such as exercising more, losing weight, and quitting smoking. Follow these instructions at home: Medicines  Take over-the-counter and prescription medicines only as told by your health care provider. Do not increase the dosage without first discussing it with your health care   provider.  If you are using self-injections, perform injections as directed by your health care provider. Make sure to avoid any veins that are on the surface of the penis. After giving an injection,  apply pressure to the injection site for 5 minutes.   General instructions  Exercise regularly, as directed by your health care provider. Work with your health care provider to lose weight, if needed.  Do not use any products that contain nicotine or tobacco, such as cigarettes and e-cigarettes. If you need help quitting, ask your health care provider.  Before using a vacuum pump, read the instructions that come with the pump and discuss any questions with your health care provider.  Keep all follow-up visits as told by your health care provider. This is important. Contact a health care provider if:  You feel nauseous.  You vomit. Get help right away if:  You are taking oral or injectable medicines and you have an erection that lasts longer than 4 hours. If your health care provider is unavailable, go to the nearest emergency room for evaluation. An erection that lasts much longer than 4 hours can result in permanent damage to your penis.  You have severe pain in your groin or abdomen.  You develop redness or severe swelling of your penis.  You have redness spreading up into your groin or lower abdomen.  You are unable to urinate.  You experience chest pain or a rapid heart beat (palpitations) after taking oral medicines. Summary  Erectile dysfunction (ED) is the inability to get or keep an erection during sexual intercourse. This problem can usually be treated successfully.  This condition is diagnosed based on a physical exam, your symptoms, and tests to determine the cause. Treatment varies depending on the cause and may include medicines, hormone therapy, surgery, or a vacuum pump.  You may need follow-up visits to make sure that you are using your medicines or devices correctly.  Get help right away if you are taking or injecting medicines and you have an erection that lasts longer than 4 hours. This information is not intended to replace advice given to you by your health  care provider. Make sure you discuss any questions you have with your health care provider. Document Revised: 08/08/2020 Document Reviewed: 08/08/2020 Elsevier Patient Education  2021 [employer] Inc[employer]  

## 2021-04-30 NOTE — Telephone Encounter (Signed)
ATC, unable to leave a message.  

## 2021-05-01 NOTE — Telephone Encounter (Signed)
Spoke with the patient. He is aware that insurance does not cover any of these medications. He stated he will pay out of pocket for it.

## 2021-05-13 ENCOUNTER — Ambulatory Visit (INDEPENDENT_AMBULATORY_CARE_PROVIDER_SITE_OTHER): Payer: Medicare HMO | Admitting: Internal Medicine

## 2021-05-13 ENCOUNTER — Encounter: Payer: Self-pay | Admitting: Internal Medicine

## 2021-05-13 DIAGNOSIS — R195 Other fecal abnormalities: Secondary | ICD-10-CM

## 2021-05-13 DIAGNOSIS — K5520 Angiodysplasia of colon without hemorrhage: Secondary | ICD-10-CM | POA: Diagnosis not present

## 2021-05-13 DIAGNOSIS — K529 Noninfective gastroenteritis and colitis, unspecified: Secondary | ICD-10-CM

## 2021-05-13 HISTORY — PX: GIVENS CAPSULE STUDY: SHX5432

## 2021-05-13 NOTE — Progress Notes (Signed)
Pt  Here for capsule endo, he completed prep without difficulty. Pt swallowed capsule and knows to return at 4pm to have monitor removed. Pt tolerated procedure well.  Capsule ID [id number] Lot# [id number] Exp:  07/21/22

## 2021-05-15 ENCOUNTER — Other Ambulatory Visit: Payer: Self-pay | Admitting: Family Medicine

## 2021-06-03 ENCOUNTER — Telehealth: Payer: Self-pay | Admitting: Family Medicine

## 2021-06-03 NOTE — Telephone Encounter (Signed)
Left message for patient to call back and schedule Medicare Annual Wellness Visit (AWV) either virtually or in office.    awvi per [facility] 02/04/16 please schedule at anytime with [facility] Nurse Health Advisor 1 or 2   This should be a 45 minute visit.

## 2021-06-05 ENCOUNTER — Other Ambulatory Visit: Payer: Self-pay | Admitting: Internal Medicine

## 2021-06-05 ENCOUNTER — Encounter: Payer: Self-pay | Admitting: Family Medicine

## 2021-06-05 DIAGNOSIS — K529 Noninfective gastroenteritis and colitis, unspecified: Secondary | ICD-10-CM

## 2021-06-05 NOTE — Telephone Encounter (Signed)
Last labs were on 07/2020 [facility] for the patient to get labs when  he does his wellness visit?

## 2021-06-11 ENCOUNTER — Other Ambulatory Visit: Payer: Self-pay | Admitting: Family Medicine

## 2021-06-11 ENCOUNTER — Other Ambulatory Visit: Payer: Self-pay

## 2021-06-11 ENCOUNTER — Ambulatory Visit (INDEPENDENT_AMBULATORY_CARE_PROVIDER_SITE_OTHER)
Admission: RE | Admit: 2021-06-11 | Discharge: 2021-06-11 | Disposition: A | Discharge status: Home / Self Care | Payer: Medicare HMO | Source: Ambulatory Visit | Attending: Internal Medicine | Admitting: Internal Medicine

## 2021-06-11 DIAGNOSIS — Z0389 Encounter for observation for other suspected diseases and conditions ruled out: Secondary | ICD-10-CM | POA: Diagnosis not present

## 2021-06-11 DIAGNOSIS — K529 Noninfective gastroenteritis and colitis, unspecified: Secondary | ICD-10-CM

## 2021-06-13 ENCOUNTER — Encounter: Payer: Self-pay | Admitting: Family Medicine

## 2021-06-13 ENCOUNTER — Emergency Department
Admission: EM | Admit: 2021-06-13 | Discharge: 2021-06-13 | Discharge status: Absent without leave | Payer: Medicare HMO | Source: Home / Self Care

## 2021-06-13 ENCOUNTER — Other Ambulatory Visit: Payer: Self-pay

## 2021-06-13 DIAGNOSIS — U071 COVID-19: Secondary | ICD-10-CM | POA: Diagnosis not present

## 2021-06-13 DIAGNOSIS — Z79899 Other long term (current) drug therapy: Secondary | ICD-10-CM | POA: Diagnosis not present

## 2021-06-13 DIAGNOSIS — Z20822 Contact with and (suspected) exposure to covid-19: Secondary | ICD-10-CM | POA: Diagnosis not present

## 2021-06-15 ENCOUNTER — Encounter: Payer: Self-pay | Admitting: Family Medicine

## 2021-06-15 DIAGNOSIS — N1832 Chronic kidney disease, stage 3b: Secondary | ICD-10-CM | POA: Diagnosis not present

## 2021-06-15 DIAGNOSIS — U071 COVID-19: Secondary | ICD-10-CM | POA: Diagnosis not present

## 2021-06-15 NOTE — Telephone Encounter (Signed)
Please advise. Kidney function has not been tested since 2020 that I see.

## 2021-06-18 DIAGNOSIS — U071 COVID-19: Secondary | ICD-10-CM | POA: Diagnosis not present

## 2021-06-18 DIAGNOSIS — N1832 Chronic kidney disease, stage 3b: Secondary | ICD-10-CM | POA: Diagnosis not present

## 2021-06-19 ENCOUNTER — Encounter: Payer: Self-pay | Admitting: Family Medicine

## 2021-06-23 ENCOUNTER — Ambulatory Visit: Payer: Medicare HMO | Admitting: Family Medicine

## 2021-06-23 DIAGNOSIS — L814 Other melanin hyperpigmentation: Secondary | ICD-10-CM | POA: Diagnosis not present

## 2021-06-23 DIAGNOSIS — Z85828 Personal history of other malignant neoplasm of skin: Secondary | ICD-10-CM | POA: Diagnosis not present

## 2021-06-23 DIAGNOSIS — L905 Scar conditions and fibrosis of skin: Secondary | ICD-10-CM | POA: Diagnosis not present

## 2021-06-23 DIAGNOSIS — D225 Melanocytic nevi of trunk: Secondary | ICD-10-CM | POA: Diagnosis not present

## 2021-06-23 DIAGNOSIS — L821 Other seborrheic keratosis: Secondary | ICD-10-CM | POA: Diagnosis not present

## 2021-06-23 DIAGNOSIS — L570 Actinic keratosis: Secondary | ICD-10-CM | POA: Diagnosis not present

## 2021-06-29 ENCOUNTER — Ambulatory Visit (INDEPENDENT_AMBULATORY_CARE_PROVIDER_SITE_OTHER): Payer: Medicare HMO | Admitting: Family Medicine

## 2021-06-29 ENCOUNTER — Other Ambulatory Visit: Payer: Self-pay

## 2021-06-29 ENCOUNTER — Encounter: Payer: Self-pay | Admitting: Family Medicine

## 2021-06-29 VITALS — BP 140/88 | HR 94 | Temp 97.8°F | Wt 174.4 lb

## 2021-06-29 DIAGNOSIS — M545 Low back pain, unspecified: Secondary | ICD-10-CM | POA: Diagnosis not present

## 2021-06-29 DIAGNOSIS — S39012A Strain of muscle, fascia and tendon of lower back, initial encounter: Secondary | ICD-10-CM | POA: Diagnosis not present

## 2021-06-29 NOTE — Progress Notes (Signed)
Subjective:    Patient ID: Daniel Sullivan, male    DOB: 1951-07-28, 70 y.o.   MRN: [id number]  Chief Complaint  Patient presents with   Back Pain    Lower back pain in right side.Started Thursday, hurts when lifts leg, walking up or down steps, rolling over in bed. Tried tylenol and cold packs. sometimes sharp pain, but not constant    HPI Patient was seen today for acute concern.  Pt notes R sided low back pain starting 5 days ago on Thursday.  Pt does not recall any injury.  Initially could not lift R leg without pain.  Endorses intermittent dull sharp pain that is improving.  Tried ice, tylenol.  Denies dysuria, fever, chills, loss of bowel or bladder.  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   Diverticulosis    Elevated PSA    GERD (gastroesophageal reflux disease)    Hemorrhoids    Hx of adenomatous colonic polyps    Hyperlipidemia    Hypertension    Hypogonadism in male     No Known Allergies  ROS General: Denies fever, chills, night sweats, changes in weight, changes in appetite HEENT: Denies headaches, ear pain, changes in vision, rhinorrhea, sore throat CV: Denies CP, palpitations, SOB, orthopnea Pulm: Denies SOB, cough, wheezing GI: Denies abdominal pain, nausea, vomiting, diarrhea, constipation GU: Denies dysuria, hematuria, frequency Msk: Denies muscle cramps, joint pains + low back pain Neuro: Denies weakness, numbness, tingling Skin: Denies rashes, bruising Psych: Denies depression, anxiety, hallucinations     Objective:    Blood pressure 140/88, pulse 94, temperature 97.8 F (36.6 C), temperature source Oral, weight 174 lb 6.4 oz (79.1 kg), SpO2 97 %.  Gen. Pleasant, well-nourished, in no distress, normal affect   HEENT:  [place]/AT, face symmetric, conjunctiva clear, no scleral icterus, PERRLA, EOMI, nares patent without drainage Lungs: no accessory muscle  use Cardiovascular: RRR, no peripheral edema Musculoskeletal: No TTP of cervical, thoracic, or midline lumbar spine.  TTP of lumbar paraspinal muscles, R>L.  No LE weakness.  No deformities, no cyanosis or clubbing, normal tone Neuro:  A&Ox3, CN II-XII intact, normal gait Skin:  Warm, no lesions/ rash   Wt Readings from Last 3 Encounters:  06/29/21 174 lb 6.4 oz (79.1 kg)  04/29/21 176 lb 4.8 oz (80 kg)  04/14/21 179 lb 9.6 oz (81.5 kg)    Lab Results  Component Value Date   WBC 6.5 04/14/2021   HGB 14.6 04/14/2021   HCT 44.2 04/14/2021   PLT 190.0 04/14/2021   GLUCOSE 111 (H) 08/04/2020   CHOL 201 (H) 12/04/2019   TRIG 104.0 12/04/2019   HDL 58.70 12/04/2019   LDLDIRECT 117.9 03/15/2013   LDLCALC 121 (H) 12/04/2019   ALT 19 12/04/2019   AST 26 12/04/2019   NA 139 08/04/2020   K 4.3 08/04/2020   CL 100 08/04/2020   CREATININE 0.81 08/04/2020   BUN 10 08/04/2020   CO2 31 08/04/2020   TSH 2.01 12/04/2019   PSA 4.2 (H) 08/04/2020   HGBA1C 5.9 (A) 02/29/2020    Assessment/Plan:  Strain of lumbar region, initial encounter  Acute bilateral low back pain without sciatica  Advised symptoms improving.  Likely 2/2 muscle strain.  Continue supportive care including ice, heat, stretching, massage, topical analgesics, Tylenol.  Avoid increased activities for the next few weeks.  Given precautions.  F/u prn with pcp  Grier Mitts, MD

## 2021-07-07 ENCOUNTER — Encounter: Payer: Self-pay | Admitting: Family Medicine

## 2021-07-07 ENCOUNTER — Ambulatory Visit (INDEPENDENT_AMBULATORY_CARE_PROVIDER_SITE_OTHER): Payer: Medicare HMO | Admitting: Family Medicine

## 2021-07-07 ENCOUNTER — Other Ambulatory Visit: Payer: Self-pay

## 2021-07-07 VITALS — BP 130/80 | HR 105 | Temp 97.9°F | Ht 69.0 in | Wt 172.9 lb

## 2021-07-07 DIAGNOSIS — Z0000 Encounter for general adult medical examination without abnormal findings: Secondary | ICD-10-CM | POA: Diagnosis not present

## 2021-07-07 DIAGNOSIS — R7989 Other specified abnormal findings of blood chemistry: Secondary | ICD-10-CM | POA: Diagnosis not present

## 2021-07-07 LAB — CBC WITH DIFFERENTIAL/PLATELET
Basophils Absolute: 0 10*3/uL (ref 0.0–0.1)
Basophils Relative: 0.6 % (ref 0.0–3.0)
Eosinophils Absolute: 0.1 10*3/uL (ref 0.0–0.7)
Eosinophils Relative: 1.3 % (ref 0.0–5.0)
HCT: 44.9 % (ref 39.0–52.0)
Hemoglobin: 14.6 g/dL (ref 13.0–17.0)
Lymphocytes Relative: 14.3 % (ref 12.0–46.0)
Lymphs Abs: 0.9 10*3/uL (ref 0.7–4.0)
MCHC: 32.5 g/dL (ref 30.0–36.0)
MCV: 93 fl (ref 78.0–100.0)
Monocytes Absolute: 0.7 10*3/uL (ref 0.1–1.0)
Monocytes Relative: 11.6 % (ref 3.0–12.0)
Neutro Abs: 4.3 10*3/uL (ref 1.4–7.7)
Neutrophils Relative %: 72.2 % (ref 43.0–77.0)
Platelets: 202 10*3/uL (ref 150.0–400.0)
RBC: 4.83 Mil/uL (ref 4.22–5.81)
RDW: 13.8 % (ref 11.5–15.5)
WBC: 6 10*3/uL (ref 4.0–10.5)

## 2021-07-07 LAB — BASIC METABOLIC PANEL
BUN: 12 mg/dL (ref 6–23)
CO2: 28 mEq/L (ref 19–32)
Calcium: 8.7 mg/dL (ref 8.4–10.5)
Chloride: 99 mEq/L (ref 96–112)
Creatinine, Ser: 0.94 mg/dL (ref 0.40–1.50)
GFR: 81.62 mL/min (ref >60.00)
Glucose, Bld: 105 mg/dL — ABNORMAL HIGH (ref 70–99)
Potassium: 4.5 mEq/L (ref 3.5–5.1)
Sodium: 137 mEq/L (ref 135–145)

## 2021-07-07 LAB — HEPATIC FUNCTION PANEL
ALT: 13 U/L (ref 0–53)
AST: 24 U/L (ref 0–37)
Albumin: 4.1 g/dL (ref 3.5–5.2)
Alkaline Phosphatase: 48 U/L (ref 39–117)
Bilirubin, Direct: 0.1 mg/dL (ref 0.0–0.3)
Total Bilirubin: 0.8 mg/dL (ref 0.2–1.2)
Total Protein: 7.1 g/dL (ref 6.0–8.3)

## 2021-07-07 LAB — TESTOSTERONE: Testosterone: 198.82 ng/dL — ABNORMAL LOW (ref 300.00–890.00)

## 2021-07-07 LAB — LIPID PANEL
Cholesterol: 250 mg/dL — ABNORMAL HIGH (ref 0–200)
HDL: 59.1 mg/dL (ref >39.00)
LDL Cholesterol: 170 mg/dL — ABNORMAL HIGH (ref 0–99)
NonHDL: 191.07
Total CHOL/HDL Ratio: 4
Triglycerides: 103 mg/dL (ref 0.0–149.0)
VLDL: 20.6 mg/dL (ref 0.0–40.0)

## 2021-07-07 LAB — PSA: PSA: 9.74 ng/mL — ABNORMAL HIGH (ref 0.10–4.00)

## 2021-07-07 LAB — TSH: TSH: 2.95 u[IU]/mL (ref 0.35–5.50)

## 2021-07-07 NOTE — Progress Notes (Signed)
Established Patient Office Visit  Subjective:  Patient ID: Daniel Sullivan, male    DOB: [birth year]  Age: 70 y.o. MRN: [id number]  CC:  Chief Complaint  Patient presents with   medicare visit    HPI Daniel Sullivan presents for physical exam.  Continues to stay very active.  He exercises several times per week.  Sometimes cycles up to 3 hours at a time.  He has history of hypertension, low testosterone, elevated PSA, hyperlipidemia.  Past history of prediabetes range blood sugars.  Recently went back on testosterone replacement.  Currently doing 2 pumps sprays per arm once daily.  Last testosterone level still low.  Health maintenance reviewed:  -Previous shingles vaccine already given -Previous hepatitis C antibody negative -Pneumonia vaccines complete -Colonoscopy due 2031 -Has received COVID-vaccine with Moderna +1 booster.  We do not have date. -Gets annual flu vaccine  Family history-mother died age 44 unknown cause.  He had a brother that died age 98 of melanoma complications.  Father died age 51 from suicide.  His father had history of alcohol abuse.  He has another brother who is alive and well.  Social history-quit smoking 1989 after about 10-year history.  Occasional alcohol use.  Retired from [employer].  Exercises regularly especially with cycling.  Does a lot of walking as well.  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   Diverticulosis    Elevated PSA    GERD (gastroesophageal reflux disease)    Hemorrhoids    Hx of adenomatous colonic polyps    Hyperlipidemia    Hypertension    Hypogonadism in male     Past Surgical History:  Procedure Laterality Date   ANAL RECTAL MANOMETRY N/A 09/07/2017   Procedure: ANO RECTAL MANOMETRY;  Surgeon: Mauri Pole, MD;  Location: [facility];  Service: Endoscopy;  Laterality: N/A;   COLONOSCOPY W/ BIOPSIES     HEMORRHOID  BANDING      Family History  Problem Relation Age of Onset   Alcohol abuse Father    Suicidality Father    Melanoma Brother 25   Stomach cancer Neg Hx    Colon cancer Neg Hx    Rectal cancer Neg Hx    Esophageal cancer Neg Hx     Social History   Socioeconomic History   Marital status: Widowed    Spouse name: Not on file   Number of children: Not on file   Years of education: Not on file   Highest education level: Not on file  Occupational History   Occupation: retired  Tobacco Use   Smoking status: Former    Packs/day: 0.50    Years: 15.00    Pack years: 7.50    Types: Cigarettes    Quit date: 02/24/1988    Years since quitting: 33.3   Smokeless tobacco: Never  Vaping Use   Vaping Use: Never used  Substance and Sexual Activity   Alcohol use: Yes    Comment: occasionally   Drug use: No   Sexual activity: Yes    Partners: Female  Other Topics Concern   Not on file  Social History Narrative   Married, retired   Former smoker   + [place], no drugs   Social Determinants of Radio broadcast assistant Strain: Not on file  Food Insecurity: Not on Pensions consultant  Needs: Not on file  Physical Activity: Not on file  Stress: Not on file  Social Connections: Not on file  Intimate Partner Violence: Not on file    Outpatient Medications Prior to Visit  Medication Sig Dispense Refill   amLODipine (NORVASC) 5 MG tablet TAKE 1 TABLET BY MOUTH EVERY DAY 90 tablet 1   diphenoxylate-atropine (LOMOTIL) 2.5-0.025 MG tablet 1-2 tabs as needed up to 4 times a day 90 tablet 0   fluticasone (CUTIVATE) 0.005 % ointment Apply BID to face PRN. Apply 2nd     fluticasone (FLONASE) 50 MCG/ACT nasal spray USE 2 SPRAYS INTO BOTH NOSTRILS DAILY. 16 g 0   Glucosamine-Chondroit-Vit C-Mn TABS Take 1 tablet by mouth daily.     hydrocortisone 2.5 % cream apply bid prn irritation     losartan (COZAAR) 50 MG tablet TAKE 1 TABLET BY MOUTH EVERY DAY 90 tablet 3   Multiple Vitamins-Minerals  (MENS MULTI VITAMIN & MINERAL PO) Take 1 tablet by mouth daily.     mupirocin ointment (BACTROBAN) 2 % Apply  a small amount to affected area twice a day  Apply 1st     omeprazole (PRILOSEC) 20 MG capsule TAKE 1 CAPSULE BY MOUTH EVERY DAY 90 capsule 1   pramipexole (MIRAPEX) 0.125 MG tablet TAKE ONE TO TWO TABLETS AT NIGHT AS NEEDED FOR RESTLESS LEGS. 180 tablet 1   sildenafil (VIAGRA) 100 MG tablet Take 0.5-1 tablets (50-100 mg total) by mouth daily as needed for erectile dysfunction. 10 tablet 5   Testosterone 20.25 MG/ACT (1.62%) GEL Apply 2 pump spray per arm once daily early morning 75 g 5   triamcinolone cream (KENALOG) 0.1 % Apply BID to affected areas as needed for itching. Avoid face, groin, underarms     vitamin C (ASCORBIC ACID) 500 MG tablet Take 500 mg by mouth daily.     pravastatin (PRAVACHOL) 40 MG tablet TAKE 1 TABLET BY MOUTH EVERY DAY (Patient not taking: Reported on 07/07/2021) 90 tablet 0   gabapentin (NEURONTIN) 300 MG capsule Take 1 capsule by mouth in the morning and at bedtime.     No facility-administered medications prior to visit.    No Known Allergies  ROS Review of Systems  Constitutional:  Negative for activity change, appetite change, fatigue and fever.  HENT:  Negative for congestion, ear pain and trouble swallowing.   Eyes:  Negative for pain and visual disturbance.  Respiratory:  Negative for cough, shortness of breath and wheezing.   Cardiovascular:  Negative for chest pain and palpitations.  Gastrointestinal:  Negative for abdominal distention, abdominal pain, blood in stool, constipation, diarrhea, nausea, rectal pain and vomiting.  Endocrine: Negative for polydipsia and polyuria.  Genitourinary:  Negative for dysuria, hematuria and testicular pain.  Musculoskeletal:  Negative for arthralgias and joint swelling.  Skin:  Negative for rash.  Neurological:  Negative for dizziness, syncope and headaches.  Hematological:  Negative for adenopathy.   Psychiatric/Behavioral:  Negative for confusion and dysphoric mood.      Objective:    Physical Exam Constitutional:      General: He is not in acute distress.    Appearance: He is well-developed.  HENT:     Head: Normocephalic and atraumatic.     Right Ear: External ear normal.     Left Ear: External ear normal.  Eyes:     Conjunctiva/sclera: Conjunctivae normal.     Pupils: Pupils are equal, round, and reactive to light.  Neck:     Thyroid: No thyromegaly.  Cardiovascular:     Rate and Rhythm: Normal rate and regular rhythm.     Heart sounds: Normal heart sounds. No murmur heard. Pulmonary:     Effort: No respiratory distress.     Breath sounds: No wheezing or rales.  Abdominal:     General: Bowel sounds are normal. There is no distension.     Palpations: Abdomen is soft. There is no mass.     Tenderness: There is no abdominal tenderness. There is no guarding or rebound.  Musculoskeletal:     Cervical back: Normal range of motion and neck supple.     Right lower leg: No edema.     Left lower leg: No edema.  Lymphadenopathy:     Cervical: No cervical adenopathy.  Skin:    Findings: No rash.  Neurological:     Mental Status: He is alert and oriented to person, place, and time.     Cranial Nerves: No cranial nerve deficit.     Deep Tendon Reflexes: Reflexes normal.    BP 130/80 (BP Location: Left Arm, Patient Position: Sitting, Cuff Size: Normal)   Pulse (!) 105   Temp 97.9 F (36.6 C) (Oral)   Ht '5\' 9"'$  (1.753 m)   Wt 172 lb 14.4 oz (78.4 kg)   SpO2 98%   BMI 25.53 kg/m  Wt Readings from Last 3 Encounters:  07/07/21 172 lb 14.4 oz (78.4 kg)  06/29/21 174 lb 6.4 oz (79.1 kg)  04/29/21 176 lb 4.8 oz (80 kg)     Health Maintenance Due  Topic Date Due   COVID-19 Vaccine (3 - Booster for Moderna series) 07/09/2020   INFLUENZA VACCINE  07/06/2021    There are no preventive care reminders to display for this patient.  Lab Results  Component Value Date    TSH 2.01 12/04/2019   Lab Results  Component Value Date   WBC 6.5 04/14/2021   HGB 14.6 04/14/2021   HCT 44.2 04/14/2021   MCV 94.3 04/14/2021   PLT 190.0 04/14/2021   Lab Results  Component Value Date   NA 139 08/04/2020   K 4.3 08/04/2020   CO2 31 08/04/2020   GLUCOSE 111 (H) 08/04/2020   BUN 10 08/04/2020   CREATININE 0.81 08/04/2020   BILITOT 0.5 12/04/2019   ALKPHOS 57 12/04/2019   AST 26 12/04/2019   ALT 19 12/04/2019   PROT 7.0 12/04/2019   ALBUMIN 4.4 12/04/2019   CALCIUM 8.9 08/04/2020   GFR 92.94 12/04/2019   Lab Results  Component Value Date   CHOL 201 (H) 12/04/2019   Lab Results  Component Value Date   HDL 58.70 12/04/2019   Lab Results  Component Value Date   LDLCALC 121 (H) 12/04/2019   Lab Results  Component Value Date   TRIG 104.0 12/04/2019   Lab Results  Component Value Date   CHOLHDL 3 12/04/2019   Lab Results  Component Value Date   HGBA1C 5.9 (A) 02/29/2020      Assessment & Plan:   Physical exam.  He has chronic problems as above which are stable.  We discussed the following health maintenance issues  -Check lab work.  Will include testosterone level and PSA as well as CBC with his history of testosterone replacement. -Continue with annual flu vaccine -other vaccines up to date   No orders of the defined types were placed in this encounter.   Follow-up: No follow-ups on file.    Carolann Littler, MD

## 2021-07-16 ENCOUNTER — Telehealth: Payer: Self-pay | Admitting: Family Medicine

## 2021-07-16 MED ORDER — TESTOSTERONE CYPIONATE 200 MG/ML IJ SOLN: 200.0000 mg | INTRAMUSCULAR | 0 refills | Status: DC

## 2021-07-16 NOTE — Telephone Encounter (Signed)
Called patient to inform that refills are on file for Testosterone 20.25 MG at [facility] [place], he stated that he pharmacist said he had none on file.    Patient has requested to start the Testosterone injections as oppose to the gel.   He stated he would need  someone to show him how to give the  injection.  Would the patient schedule a nurse visit for this?   Please advise

## 2021-07-16 NOTE — Telephone Encounter (Signed)
Spoke with patient to make aware Testosterone injection has been sent to pharmacy, patient requested if prescription of gel can be sent because he doesn't have any for tomorrow, then start injections.

## 2021-07-16 NOTE — Telephone Encounter (Signed)
I took the topical testosterone off the list and sent in rx for IM testosterone.   He will need to pick up vial and then set up nursing follow up (with his medication) to get first injection and instruct on injecting.

## 2021-07-16 NOTE — Telephone Encounter (Signed)
PT needs a refill of their Testosterone 20.25 MG/ACT (1.62%) GEL called into the [facility] [place] on [facility].

## 2021-07-16 NOTE — Telephone Encounter (Signed)
Called patient to inform of message, he stated that he's out of town will not be back until Friday evening, and he will not have time to come in for a nurse visit.   He stated he will be in available one week to come in.   Please advise.

## 2021-07-16 NOTE — Addendum Note (Signed)
Addended by: Eulas Post on: 07/16/2021 02:41 PM   Modules accepted: Orders

## 2021-07-17 ENCOUNTER — Ambulatory Visit (INDEPENDENT_AMBULATORY_CARE_PROVIDER_SITE_OTHER): Payer: Medicare HMO

## 2021-07-17 ENCOUNTER — Ambulatory Visit: Payer: Medicare HMO

## 2021-07-17 ENCOUNTER — Other Ambulatory Visit: Payer: Self-pay

## 2021-07-17 DIAGNOSIS — R7989 Other specified abnormal findings of blood chemistry: Secondary | ICD-10-CM | POA: Diagnosis not present

## 2021-07-17 DIAGNOSIS — E291 Testicular hypofunction: Secondary | ICD-10-CM

## 2021-07-17 MED ORDER — TESTOSTERONE CYPIONATE 200 MG/ML IM SOLN
200.0000 mg | INTRAMUSCULAR | Status: DC
  Administered 2021-07-17: 200 mg via INTRAMUSCULAR

## 2021-07-17 NOTE — Telephone Encounter (Addendum)
See previous messages.  Please send prescription for Testosterone 20.'25mg'$  gel, to pharmacy on file.   Thanks

## 2021-07-17 NOTE — Telephone Encounter (Signed)
Request has been sent to provider

## 2021-07-17 NOTE — Telephone Encounter (Addendum)
Please see previous messages

## 2021-07-17 NOTE — Progress Notes (Signed)
Per orders of Dr. Elease Hashimoto, injection of Testosterone 200 mg/ml given by Christle Nolting L Jaxsyn Catalfamo. Patient tolerated injection well.

## 2021-07-21 ENCOUNTER — Encounter: Payer: Self-pay | Admitting: Family Medicine

## 2021-07-25 ENCOUNTER — Other Ambulatory Visit: Payer: Self-pay | Admitting: Family Medicine

## 2021-07-27 NOTE — Telephone Encounter (Signed)
Last filled 07/16/2021 Last OV 07/07/2021  Ok to refill?

## 2021-07-28 DIAGNOSIS — K006 Disturbances in tooth eruption: Secondary | ICD-10-CM | POA: Diagnosis not present

## 2021-08-02 ENCOUNTER — Other Ambulatory Visit: Payer: Self-pay | Admitting: Family Medicine

## 2021-08-07 DIAGNOSIS — M79671 Pain in right foot: Secondary | ICD-10-CM | POA: Diagnosis not present

## 2021-08-07 DIAGNOSIS — M79672 Pain in left foot: Secondary | ICD-10-CM | POA: Diagnosis not present

## 2021-08-07 DIAGNOSIS — M71572 Other bursitis, not elsewhere classified, left ankle and foot: Secondary | ICD-10-CM | POA: Diagnosis not present

## 2021-08-07 DIAGNOSIS — M7732 Calcaneal spur, left foot: Secondary | ICD-10-CM | POA: Diagnosis not present

## 2021-08-07 DIAGNOSIS — M722 Plantar fascial fibromatosis: Secondary | ICD-10-CM | POA: Diagnosis not present

## 2021-08-12 ENCOUNTER — Other Ambulatory Visit: Payer: Self-pay

## 2021-08-12 ENCOUNTER — Encounter: Payer: Self-pay | Admitting: Family Medicine

## 2021-08-12 DIAGNOSIS — R972 Elevated prostate specific antigen [PSA]: Secondary | ICD-10-CM

## 2021-08-13 ENCOUNTER — Encounter: Payer: Self-pay | Admitting: Family Medicine

## 2021-08-13 ENCOUNTER — Other Ambulatory Visit (INDEPENDENT_AMBULATORY_CARE_PROVIDER_SITE_OTHER): Payer: Medicare HMO

## 2021-08-13 DIAGNOSIS — R972 Elevated prostate specific antigen [PSA]: Secondary | ICD-10-CM

## 2021-08-13 LAB — PSA: PSA: 8.22 ng/mL — ABNORMAL HIGH (ref 0.10–4.00)

## 2021-08-14 ENCOUNTER — Other Ambulatory Visit: Payer: Self-pay

## 2021-08-14 DIAGNOSIS — R972 Elevated prostate specific antigen [PSA]: Secondary | ICD-10-CM

## 2021-09-23 ENCOUNTER — Encounter: Payer: Self-pay | Admitting: Family Medicine

## 2021-09-23 NOTE — Telephone Encounter (Signed)
I have reached out to [facility] for assistance with this.

## 2021-10-02 ENCOUNTER — Ambulatory Visit: Payer: Medicare HMO | Admitting: Urology

## 2021-10-02 ENCOUNTER — Other Ambulatory Visit: Payer: Self-pay

## 2021-10-02 ENCOUNTER — Other Ambulatory Visit
Admission: RE | Admit: 2021-10-02 | Discharge: 2021-10-02 | Disposition: A | Discharge status: Home / Self Care | Payer: Medicare HMO | Attending: Urology | Admitting: Urology

## 2021-10-02 VITALS — BP 184/90 | HR 108 | Ht 69.5 in | Wt 157.0 lb

## 2021-10-02 DIAGNOSIS — R7989 Other specified abnormal findings of blood chemistry: Secondary | ICD-10-CM | POA: Diagnosis not present

## 2021-10-02 DIAGNOSIS — R972 Elevated prostate specific antigen [PSA]: Secondary | ICD-10-CM | POA: Insufficient documentation

## 2021-10-02 LAB — URINALYSIS, COMPLETE (UACMP) WITH MICROSCOPIC
Bilirubin Urine: NEGATIVE
Glucose, UA: NEGATIVE mg/dL
Hgb urine dipstick: NEGATIVE
Leukocytes,Ua: NEGATIVE
Nitrite: NEGATIVE
Protein, ur: 30 mg/dL — AB
Specific Gravity, Urine: 1.02 (ref 1.005–1.030)
pH: 7 (ref 5.0–8.0)

## 2021-10-02 NOTE — Patient Instructions (Signed)

## 2021-10-02 NOTE — Progress Notes (Signed)
10/02/2021 2:05 PM   Daniel Sullivan October 25, 1951 [id number]  Referring provider: Eulas Post, MD [place],  [place] [address]  Chief Complaint  Patient presents with   Elevated PSA    HPI: 70 year old male with a personal history of elevated PSA and hypogonadism who presents today for further elevation of an acute PSA rise.  Previously followed by Dr. Jeffie Pollock at [facility].  Has not been seen since 2020.  S/p prostate MRI in 01/2019 which showed no lesions, PI-RADS 1 ; volume 3.7 x 3.2 x 5.1 cm  He is also s/p biopsy in 2014.  Presumably this was negative, done by Dr. Kellie Simmering.  Personal history of hypogonadism on AndroGel pumps later transition to testosterone cypionate but now being held in light up PSA rise.  No family history of prostate cancer.  He denies any urinary symptoms including urgency frequency dysuria, etc.   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   Diverticulosis    Elevated PSA    GERD (gastroesophageal reflux disease)    Hemorrhoids    Hx of adenomatous colonic polyps    Hyperlipidemia    Hypertension    Hypogonadism in male     Surgical History: Past Surgical History:  Procedure Laterality Date   ANAL RECTAL MANOMETRY N/A 09/07/2017   Procedure: ANO RECTAL MANOMETRY;  Surgeon: Mauri Pole, MD;  Location: [facility];  Service: Endoscopy;  Laterality: N/A;   COLONOSCOPY W/ BIOPSIES     HEMORRHOID BANDING      Home Medications:  Allergies as of 10/02/2021   No Known Allergies      Medication List        Accurate as of October 02, 2021 11:59 PM. If you have any questions, ask your nurse or doctor.          STOP taking these medications    diphenoxylate-atropine 2.5-0.025 MG tablet Commonly known as: Lomotil Stopped by: Hollice Espy, MD   fluticasone 0.005 % ointment Commonly known as: CUTIVATE Stopped by: Hollice Espy, MD   fluticasone 50 MCG/ACT nasal spray Commonly known as: FLONASE Stopped by: Hollice Espy, MD   hydrocortisone 2.5 % cream Stopped by: Hollice Espy, MD   mupirocin ointment 2 % Commonly known as: BACTROBAN Stopped by: Hollice Espy, MD   pramipexole 0.125 MG tablet Commonly known as: MIRAPEX Stopped by: Hollice Espy, MD   triamcinolone cream 0.1 % Commonly known as: KENALOG Stopped by: Hollice Espy, MD       TAKE these medications    amLODipine 5 MG tablet Commonly known as: NORVASC TAKE 1 TABLET BY MOUTH EVERY DAY   Glucosamine-Chondroit-Vit C-Mn Tabs Take 1 tablet by mouth daily.   losartan 50 MG tablet Commonly known as: COZAAR TAKE 1 TABLET BY MOUTH EVERY DAY   MENS MULTI VITAMIN & MINERAL PO Take 1 tablet by mouth daily.   omeprazole 20 MG capsule Commonly known as: PRILOSEC TAKE 1  CAPSULE BY MOUTH EVERY DAY   pravastatin 40 MG tablet Commonly known as: PRAVACHOL TAKE 1 TABLET BY MOUTH EVERY DAY   sildenafil 100 MG tablet Commonly known as: Viagra Take 0.5-1 tablets (50-100 mg total) by mouth daily as needed for erectile dysfunction.   vitamin C 500 MG tablet Commonly known as: ASCORBIC ACID Take 500 mg by mouth daily.        Allergies: No Known Allergies  Family History: Family History  Problem Relation Age of Onset   Alcohol abuse Father    Suicidality Father    Melanoma Brother 66   Stomach cancer Neg Hx    Colon cancer Neg Hx    Rectal cancer Neg Hx    Esophageal cancer Neg Hx     Social History:  reports that he quit smoking about 33 years ago. His smoking use included cigarettes. He has a 7.50 pack-year smoking  history. He has never used smokeless tobacco. He reports current alcohol use. He reports that he does not use drugs.   Physical Exam: BP (!) 184/90   Pulse (!) 108   Ht 5' 9.5" (1.765 m)   Wt 157 lb (71.2 kg)   BMI 22.85 kg/m   Constitutional:  Alert and oriented, No acute distress. HEENT: [place] AT, moist mucus membranes.  Trachea midline, no masses. Cardiovascular: No clubbing, cyanosis, or edema. Respiratory: Normal respiratory effort, no increased work of breathing. GI: Abdomen is soft, nontender, nondistended, no abdominal masses Rectal: Normal sphincter tone.  Nontender prostate, 50 g, no nodules.  Symmetric. Neurologic: Grossly intact, no focal deficits, moving all 4 extremities. Psychiatric: Normal mood and affect.  Laboratory Data: Lab Results  Component Value Date   WBC 6.0 07/07/2021   HGB 14.6 07/07/2021   HCT 44.9 07/07/2021   MCV 93.0 07/07/2021   PLT 202.0 07/07/2021    Lab Results  Component Value Date   CREATININE 0.94 07/07/2021    PSA as above  Urinalysis Component     Latest Ref Rng & Units 10/02/2021  Color, Urine     YELLOW YELLOW  Appearance     CLEAR CLEAR  Specific Gravity, Urine     1.005 - 1.030 1.020  pH     5.0 - 8.0 7.0  Glucose, UA     NEGATIVE mg/dL NEGATIVE  Hgb urine dipstick     NEGATIVE NEGATIVE  Bilirubin Urine     NEGATIVE NEGATIVE  Ketones, ur     NEGATIVE mg/dL TRACE (A)  Protein     NEGATIVE mg/dL 30 (A)  Nitrite     NEGATIVE NEGATIVE  Leukocytes,Ua     NEGATIVE NEGATIVE  Squamous Epithelial / LPF     0 - 5 0-5  WBC, UA     0 - 5 WBC/hpf 0-5  RBC / HPF     0 - 5 RBC/hpf 0-5  Bacteria, UA     NONE SEEN FEW (A)    Assessment & Plan:    1. ELEVATED PROSTATE SPECIFIC ANTIGEN  We reviewed the implications of an elevated PSA and the uncertainty surrounding it. In general, a man's PSA increases with age and is produced by both normal and cancerous prostate tissue. Differential for elevated PSA is BPH, prostate  cancer, infection, recent intercourse/ejaculation, prostate infarction, recent urethroscopic manipulation (foley placement/cystoscopy) and prostatitis. Management of an elevated PSA can include observation or prostate biopsy and wediscussed this in detail. We discussed that indications for prostate biopsy are defined by age and race specific PSA  cutoffs as well as a PSA velocity of 0.75/year.  There is been a fairly acute rise over the past year which is somewhat concerning although he does have a personal history of PSA fluctuation, fairly unremarkable MRI a few years ago and history of negative biopsy.  That being said, he does desire to continue testosterone and as such, is motivated to diagnose or identify any underlying conditions.  As such, we discussed either repeat MRI versus prostate biopsy.  He preferred to go ahead and pursue prostate biopsy.  We discussed prostate biopsy in detail including the procedure itself, the risks of blood in the urine, stool, and ejaculate, serious infection, and discomfort. He is willing to proceed with this as discussed.  Also check urine today to ensure that he does not have underlying infection as a cause for his PSA rise.  Records release signed today to [facility] - Urinalysis, Complete w Microscopic (For [facility] ONLY); Future  2. Low testosterone Hold until after biopsy/ above work up complete  Schedule prostate biopsy  Hollice Espy, MD  [facility] [address], [place], [place] [address] [phone]

## 2021-10-11 ENCOUNTER — Encounter: Payer: Self-pay | Admitting: Urology

## 2021-10-11 ENCOUNTER — Other Ambulatory Visit: Payer: Self-pay | Admitting: Family Medicine

## 2021-10-12 ENCOUNTER — Encounter: Payer: Self-pay | Admitting: Urology

## 2021-10-12 NOTE — Telephone Encounter (Signed)
Please advise. Rx is not on the current med list 

## 2021-10-13 NOTE — Telephone Encounter (Signed)
Please advise. Rx was discontinued by another provider

## 2021-10-14 NOTE — Telephone Encounter (Signed)
Left message for patient to call back  

## 2021-10-15 NOTE — Telephone Encounter (Signed)
Left message for patient to call back  

## 2021-10-19 NOTE — Telephone Encounter (Signed)
ATC, unable to leave a voice mail. Unable to reach the patient. Rx will be denied with a message to the pharmacy stating the patient needs to contact us.

## 2021-10-19 NOTE — Progress Notes (Signed)
   10/20/21  CC:  Chief Complaint  Patient presents with   Prostate Biopsy     HPI: Daniel Sullivan is a 70 y.o. male with a personal history of of elevated PSA and hypogonadism who returns today for prostate biopsy.   Previously followed by Dr. Jeffie Pollock at [facility].  Has not been seen since 2020.   S/p prostate MRI in 01/2019 which showed no lesions, PI-RADS 1 ; volume 3.7 x 3.2 x 5.1 cm   He is also s/p biopsy in 2014.  Presumably this was negative, done by Dr. Kellie Simmering.  Most recent PSA as of 08/13/2021 was 8.22.   Vitals:   10/20/21 1435  BP: (!) 156/90  Pulse: 88  NED. A&Ox3.   No respiratory distress   Abd soft, NT, ND Normal external genitalia with patent urethral meatus  Prostate Biopsy Procedure   Informed consent was obtained after discussing risks/benefits of the procedure.  A time out was performed to ensure correct patient identity.  Pre-Procedure: - Last PSA Level:  Lab Results  Component Value Date   PSA 8.22 (H) 08/13/2021   PSA 9.74 (H) 07/07/2021   PSA 4.2 (H) 08/04/2020   - Gentamicin given prophylactically - Levaquin 500 mg administered PO -Transrectal Ultrasound performed revealing a 56.6 gm prostate -No significant hypoechoic or median lobe noted  Procedure: - Prostate block performed using 10 cc 1% lidocaine and biopsies taken from sextant areas, a total of 12 under ultrasound guidance.  Post-Procedure: - Patient tolerated the procedure well - He was counseled to seek immediate medical attention if experiences any severe pain, significant bleeding, or fevers - Return in one week to discuss biopsy results   I,Kailey Littlejohn,acting as a scribe for Hollice Espy, MD.,have documented all relevant documentation on the behalf of Hollice Espy, MD,as directed by  Hollice Espy, MD while in the presence of Hollice Espy, MD.'  I have reviewed the above documentation for accuracy and completeness, and I agree with the above.   Hollice Espy, MD

## 2021-10-20 ENCOUNTER — Other Ambulatory Visit: Payer: Self-pay

## 2021-10-20 ENCOUNTER — Encounter: Payer: Self-pay | Admitting: Urology

## 2021-10-20 ENCOUNTER — Ambulatory Visit: Payer: Medicare HMO | Admitting: Urology

## 2021-10-20 VITALS — BP 156/90 | HR 88 | Ht 69.5 in | Wt 157.0 lb

## 2021-10-20 DIAGNOSIS — R972 Elevated prostate specific antigen [PSA]: Secondary | ICD-10-CM | POA: Diagnosis not present

## 2021-10-20 DIAGNOSIS — C61 Malignant neoplasm of prostate: Secondary | ICD-10-CM | POA: Diagnosis not present

## 2021-10-20 DIAGNOSIS — Z298 Encounter for other specified prophylactic measures: Secondary | ICD-10-CM | POA: Diagnosis not present

## 2021-10-20 MED ORDER — GENTAMICIN SULFATE 40 MG/ML IJ SOLN
80.0000 mg | Freq: Once | INTRAMUSCULAR | Status: AC
  Administered 2021-10-20: 80 mg via INTRAMUSCULAR

## 2021-10-20 MED ORDER — LEVOFLOXACIN 500 MG PO TABS
500.0000 mg | ORAL_TABLET | Freq: Once | ORAL | Status: AC
Start: 2021-10-20 — End: 2021-10-20
  Administered 2021-10-20: 500 mg via ORAL

## 2021-10-20 NOTE — Patient Instructions (Signed)
Transrectal Ultrasound-Guided Prostate Biopsy, Care After The following information offers guidance on how to care for yourself after your procedure. Your health care provider may also give you more specific instructions. If you have problems or questions, contact your health care provider. What can I expect after the procedure? After the procedure, it is common to have: Pain and discomfort near your rectum, especially while sitting. Pink-colored urine due to small amounts of blood in your urine. A burning feeling while urinating. Blood in your stool (feces) or bleeding from your rectum. Blood in your semen. Follow these instructions at home: Medicines Take over-the-counter and prescription medicines only as told by your health care provider. If you were given a sedative during your procedure, it can affect you for several hours. Do not drive or operate machinery until your health care provider says that it is safe. If you were prescribed an antibiotic medicine, take it as told by your health care provider. Do not stop using the antibiotic even if you start to feel better. Activity  Return to your normal activities as told by your health care provider. Ask your health care provider what activities are safe for you. Ask your health care provider when it is okay for you to resume sexual activity. You may have to avoid lifting. Ask your health care provider how much you can safely lift. General instructions  Drink enough fluid to keep your urine pale yellow. Watch your urine, stool, and semen for new or increased bleeding. Keep all follow-up visits. This is important. Contact a health care provider if: You have any of the following: Blood clots in your urine or stool. Blood in your urine more than 2 weeks after the procedure. Blood in your semen more than 2 months after the procedure. New or increased bleeding in your urine, stool, or semen. Severe pain in your abdomen. Your urine smells  bad or unusual. You have trouble urinating. Your lower abdomen feels firm. You have problems getting an erection. You have nausea or you vomit. Get help right away if: You have a fever or chills. This could be a sign of infection. You have bright red urine. You have severe pain that does not get better with medicine. You cannot urinate. Summary After this procedure, it is common to have pain and discomfort around your rectum, especially while sitting. You may have blood in your urine and stool after the procedure. It is common to have blood in your semen after this procedure. Get help right away if you have a fever or chills. This could be a sign of infection. This information is not intended to replace advice given to you by your health care provider. Make sure you discuss any questions you have with your health care provider. Document Revised: 05/18/2021 Document Reviewed: 05/18/2021 Elsevier Patient Education  [place].

## 2021-10-23 LAB — SURGICAL PATHOLOGY

## 2021-10-27 ENCOUNTER — Ambulatory Visit: Payer: Medicare HMO | Admitting: Urology

## 2021-10-27 NOTE — Progress Notes (Addendum)
10/28/21 9:58 AM   Daniel Sullivan May 24, 1951 [id number]  Referring provider:  Eulas Post, MD Daniel Sullivan,  Daniel Sullivan [id number] Chief Complaint  Patient presents with   Results     HPI: Daniel Sullivan is a 70 y.o.male with a personal history of elevated PSA and hypogonadism previously on Androgel and was thinking of trying testosterone cypionate , who returns today for prostate biopsy results.   Previously followed by Dr. Jeffie Sullivan at [facility].  Has not been seen since 2020.   S/p prostate MRI in 01/2019 which showed no lesions, PI-RADS 1 ; volume 3.7 x 3.2 x 5.1 cm   He is also s/p biopsy in 2014.  Presumably this was negative, done by Dr. Kellie Sullivan.  He underwent prostate biopsy on 10/20/2021 which revealed Gleason 3+3=6 involving 1 core at the right apex affecting 14%. TRUS 56.6 cc.   He is doing well today.    IPSS     Row Name 10/28/21 0900         International Prostate Symptom Score   How often have you had the sensation of not emptying your bladder? Not at All     How often have you had to urinate less than every two hours? Not at All     How often have you found you stopped and started again several times when you urinated? Not at All     How often have you found it difficult to postpone urination? Less than 1 in 5 times     How often have you had a weak urinary stream? Not at All     How often have you had to strain to start urination? Not at All     How many times did you typically get up at night to urinate? 1 Time     Total IPSS Score 2       Quality of Life due to urinary symptoms   If you were to spend the rest of your life with your urinary condition just the way it is now how would you feel about that? Delighted              Score:  1-7 Mild 8-19 Moderate 20-35 Severe   SHIM     Row Name 10/28/21 0944         SHIM: Over the last 6 months:   How do you rate your confidence that you could get and keep an erection?  Moderate     When you had erections with sexual stimulation, how often were your erections hard enough for penetration (entering your partner)? Most Times (much more than half the time)     During sexual intercourse, how often were you able to maintain your erection after you had penetrated (entered) your partner? Most Times (much more than half the time)     During sexual intercourse, how difficult was it to maintain your erection to completion of intercourse? Not Difficult     When you attempted sexual intercourse, how often was it satisfactory for you? Most Times (much more than half the time)       SHIM Total Score   SHIM 20               Surgical pathology: DIAGNOSIS:  Diagnostic Map    Benign  Atypical  Malignant  HGPIN      Diagnostic Summary   [A] PROSTATE, LEFT BASE:   NEGATIVE FOR MALIGNANCY.   [B] PROSTATE,  LEFT MID:   NEGATIVE FOR MALIGNANCY.   [C] PROSTATE, LEFT APEX:   NEGATIVE FOR MALIGNANCY.   [D] PROSTATE, RIGHT BASE:   NEGATIVE FOR MALIGNANCY.   [E] PROSTATE, RIGHT MID:   NEGATIVE FOR MALIGNANCY.   [F] PROSTATE, RIGHT APEX:   ACINAR ADENOCARCINOMA, GLEASON 3+3=6  (GG  1), INVOLVING 1 CORES, MEASURING 1  MM ( 14%).   [G] PROSTATE, LEFT LATERAL BASE:   NEGATIVE FOR MALIGNANCY.   [H] PROSTATE, LEFT LATERAL MID:   NEGATIVE FOR MALIGNANCY.   [I] PROSTATE, LEFT LATERAL APEX:   SMALL FOCUS OF ATYPICAL GLANDS.   [J] PROSTATE, RIGHT LATERAL BASE:   NEGATIVE FOR MALIGNANCY.   [K] PROSTATE, RIGHT LATERAL MID:   NEGATIVE FOR MALIGNANCY.   [L] PROSTATE, RIGHT LATERAL APEX:   SMALL FOCUS OF ATYPICAL GLANDS.      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   Diverticulosis    Elevated PSA    GERD (gastroesophageal reflux disease)    Hemorrhoids    Hx of adenomatous colonic polyps    Hyperlipidemia    Hypertension    Hypogonadism in male     Surgical History: Past  Surgical History:  Procedure Laterality Date   ANAL RECTAL MANOMETRY N/A 09/07/2017   Procedure: ANO RECTAL MANOMETRY;  Surgeon: Daniel Pole, MD;  Location: [facility];  Service: Endoscopy;  Laterality: N/A;   COLONOSCOPY W/ BIOPSIES     HEMORRHOID BANDING      Home Medications:  Allergies as of 10/28/2021   No Known Allergies      Medication List        Accurate as of October 28, 2021  9:58 AM. If you have any questions, ask your nurse or doctor.          amLODipine 5 MG tablet Commonly known as: NORVASC TAKE 1 TABLET BY MOUTH EVERY DAY   Glucosamine-Chondroit-Vit C-Mn Tabs Take 1 tablet by mouth daily.   losartan 50 MG tablet Commonly known as: COZAAR TAKE 1 TABLET BY MOUTH EVERY DAY   MENS MULTI VITAMIN & MINERAL PO Take 1 tablet by mouth daily.   omeprazole 20 MG capsule Commonly known as: PRILOSEC TAKE 1 CAPSULE BY MOUTH EVERY DAY   pravastatin 40 MG tablet Commonly known as: PRAVACHOL TAKE 1 TABLET BY MOUTH EVERY DAY   sildenafil 100 MG tablet Commonly known as: Viagra Take 0.5-1 tablets (50-100 mg total) by mouth daily as needed for erectile dysfunction.   vitamin C 500 MG tablet Commonly known as: ASCORBIC ACID Take 500 mg by mouth daily.        Allergies: No Known Allergies  Family History: Family History  Problem Relation Age of Onset   Alcohol abuse Father    Suicidality Father    Melanoma Brother 28   Stomach cancer Neg Hx    Colon cancer Neg Hx    Rectal cancer Neg Hx    Esophageal cancer Neg Hx     Social History:  reports that he quit smoking about 33 years ago. His smoking use included cigarettes. He has a 7.50 pack-year smoking history. He has never used smokeless tobacco. He reports current alcohol use. He reports that he does not use drugs.   Physical Exam: BP (!) 176/72   Pulse 96   Ht 5' 9.5" (1.765 m)   Wt 157  lb (71.2 kg)   BMI 22.85 kg/m   Constitutional:  Alert and oriented, No acute distress. HEENT:  [place] AT, moist mucus membranes.  Trachea midline, no masses. Cardiovascular: No clubbing, cyanosis, or edema. Respiratory: Normal respiratory effort, no increased work of breathing. Skin: No rashes, bruises or suspicious lesions. Neurologic: Grossly intact, no focal deficits, moving all 4 extremities. Psychiatric: Normal mood and affect.  Laboratory Data:  Lab Results  Component Value Date   CREATININE 0.94 07/07/2021    Lab Results  Component Value Date   PSA 8.22 (H) 08/13/2021   PSA 9.74 (H) 07/07/2021   PSA 4.2 (H) 08/04/2020    Lab Results  Component Value Date   TESTOSTERONE 198.82 (L) 07/07/2021    Lab Results  Component Value Date   HGBA1C 5.9 (A) 02/29/2020    Assessment & Plan:   Prostate cancer  - Very low risk low volume disease   - Strongly recommend active surveillance   -The patient was counseled about the natural history of prostate cancer and the standard treatment options that are available for prostate cancer. It was explained to him how his age and life expectancy, clinical stage, Gleason score, and PSA affect his prognosis, the decision to proceed with additional staging studies, as well as how that information influences recommended treatment strategies. We discussed the roles for active surveillance, radiation therapy, surgical therapy, androgen deprivation, as well as ablative therapy options for the treatment of prostate cancer as appropriate to his individual cancer situation. We discussed the risks and benefits of these options with regard to their impact on cancer control and also in terms of potential adverse events, complications, and impact on quality of life particularly related to urinary, bowel, and sexual function. The patient was encouraged to ask questions throughout the discussion today and all questions were answered to his stated satisfaction. In addition, the patient was provided with and/or directed to appropriate resources and literature  for further education about prostate cancer treatment options.  - PSA; future 6 months   -consider repeat biopsy versus MRI vs biopsy for surveillance at 1 year mark.   2. Hypogonadism  - Discussed controversy regarding testosterone therapy in light of recent prostate cancer diagnosis   - Given prostate cancer is low risk this is an option considering if PSA remains stable   -Given that he is very little clinical benefit from this medication, he would like to hold off continuing testosterone injections at this time which is reasonable   Return in 6 months for PSA  I,Kailey Littlejohn,acting as a scribe for Hollice Espy, MD.,have documented all relevant documentation on the behalf of Hollice Espy, MD,as directed by  Hollice Espy, MD while in the presence of Hollice Espy, MD.  I have reviewed the above documentation for accuracy and completeness, and I agree with the above.   Hollice Espy, MD   [facility] [address], [place],  [address] [phone]  I spent 32 total minutes on the day of the encounter including pre-visit review of the medical record, face-to-face time with the patient, and Sullivan visit ordering of labs/imaging/tests.

## 2021-10-28 ENCOUNTER — Ambulatory Visit: Payer: Medicare HMO | Admitting: Urology

## 2021-10-28 ENCOUNTER — Encounter: Payer: Self-pay | Admitting: Urology

## 2021-10-28 ENCOUNTER — Other Ambulatory Visit: Payer: Self-pay

## 2021-10-28 VITALS — BP 176/72 | HR 96 | Ht 69.5 in | Wt 157.0 lb

## 2021-10-28 DIAGNOSIS — R972 Elevated prostate specific antigen [PSA]: Secondary | ICD-10-CM | POA: Diagnosis not present

## 2021-11-10 ENCOUNTER — Ambulatory Visit (INDEPENDENT_AMBULATORY_CARE_PROVIDER_SITE_OTHER): Payer: Medicare HMO | Admitting: Family Medicine

## 2021-11-10 ENCOUNTER — Other Ambulatory Visit: Payer: Self-pay | Admitting: Family Medicine

## 2021-11-10 ENCOUNTER — Encounter: Payer: Self-pay | Admitting: Family Medicine

## 2021-11-10 VITALS — BP 144/80 | HR 85 | Temp 97.7°F | Ht 69.5 in | Wt 167.6 lb

## 2021-11-10 DIAGNOSIS — M2550 Pain in unspecified joint: Secondary | ICD-10-CM

## 2021-11-10 NOTE — Progress Notes (Signed)
Established Patient Office Visit  Subjective:  Patient ID: Daniel Sullivan, male    DOB: July 03, 1951  Age: 70 y.o. MRN: [id number]  CC:  Chief Complaint  Patient presents with   Polymyalgia    Patient believes he has polymyalgia,    Leg Pain    Pateint complains of hamstring pain    HPI Daniel Sullivan presents basically discuss whether he could have "polymyalgia rheumatica ".  He apparently has a brother that was diagnosed with this.  Patient states he went for a long cycle ride on 24 September.  Following that he had some hamstring tightness bilaterally.  He has occasional left-sided neck pains but denies any symmetric upper extremity back or neck pain or shoulder pain.  He has some generalized achiness in his joints and muscles after prolonged periods of sitting but not consistently.  He does take pravastatin and previously stopped pravastatin but did not see any improvement in his joint pains.  Does not have any excessive fatigue.  No reported fevers recently.  Recent prostate biopsy was abnormal with prostate cancer categorized as "very low risk low-volume disease ".  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   Diverticulosis    Elevated PSA    GERD (gastroesophageal reflux disease)    Hemorrhoids    Hx of adenomatous colonic polyps    Hyperlipidemia    Hypertension    Hypogonadism in male     Past Surgical History:  Procedure Laterality Date   ANAL RECTAL MANOMETRY N/A 09/07/2017   Procedure: ANO RECTAL MANOMETRY;  Surgeon: Mauri Pole, MD;  Location: [facility];  Service: Endoscopy;  Laterality: N/A;   COLONOSCOPY W/ BIOPSIES     HEMORRHOID BANDING      Family History  Problem Relation Age of Onset    Alcohol abuse Father    Suicidality Father    Melanoma Brother 60   Stomach cancer Neg Hx    Colon cancer Neg Hx    Rectal cancer Neg Hx    Esophageal cancer Neg Hx     Social History   Socioeconomic History   Marital status: Widowed    Spouse name: Not on file   Number of children: Not on file   Years of education: Not on file   Highest education level: Not on file  Occupational History   Occupation: retired  Tobacco Use   Smoking status: Former    Packs/day: 0.50    Years: 15.00    Pack years: 7.50    Types: Cigarettes    Quit date: 02/24/1988    Years since quitting: 33.7   Smokeless tobacco: Never  Vaping Use   Vaping Use: Never used  Substance and Sexual Activity   Alcohol use: Yes    Comment: occasionally   Drug use: No   Sexual activity: Yes    Partners: Female  Other Topics Concern   Not on file  Social History Narrative   Married, retired   Former smoker   + [place], no drugs   Social Determinants of Radio broadcast assistant Strain: Not on file  Food Insecurity: Not on file  Transportation Needs: Not on file  Physical Activity: Not on file  Stress: Not on file  Social Connections: Not on file  Intimate Partner Violence: Not on file    Outpatient Medications Prior to Visit  Medication Sig Dispense Refill   amLODipine (NORVASC) 5 MG tablet TAKE 1 TABLET BY MOUTH EVERY DAY 90 tablet 1   Glucosamine-Chondroit-Vit C-Mn TABS Take 1 tablet by mouth daily.     losartan (COZAAR) 50 MG tablet TAKE 1 TABLET BY MOUTH EVERY DAY 90 tablet 3   Multiple Vitamins-Minerals (MENS MULTI VITAMIN & MINERAL PO) Take 1 tablet by mouth daily.     omeprazole (PRILOSEC) 20 MG capsule TAKE 1 CAPSULE BY MOUTH EVERY DAY 90 capsule 1   pravastatin (PRAVACHOL) 40 MG tablet TAKE 1 TABLET BY MOUTH EVERY DAY 90 tablet 0   sildenafil (VIAGRA) 100 MG tablet Take 0.5-1 tablets (50-100 mg total) by mouth daily as needed for erectile dysfunction. 10 tablet 5   vitamin C (ASCORBIC  ACID) 500 MG tablet Take 500 mg by mouth daily.     Facility-Administered Medications Prior to Visit  Medication Dose Route Frequency Provider Last Rate Last Admin   testosterone cypionate (DEPOTESTOSTERONE CYPIONATE) injection 200 mg  200 mg Intramuscular Q14 Days Eulas Post, MD   200 mg at 07/17/21 1456    No Known Allergies  ROS Review of Systems  Constitutional:  Negative for appetite change, chills, fever and unexpected weight change.  Respiratory:  Negative for shortness of breath.   Cardiovascular:  Negative for chest pain.  Musculoskeletal:  Positive for arthralgias.     Objective:    Physical Exam Vitals reviewed.  Constitutional:      Appearance: Normal appearance.  Cardiovascular:     Rate and Rhythm: Normal rate and regular rhythm.  Pulmonary:     Effort: Pulmonary effort is normal.     Breath sounds: Normal breath sounds.  Musculoskeletal:     Right lower leg: No edema.     Left lower leg: No edema.  Neurological:     Mental Status: He is alert.    BP (!) 144/80 (BP Location: Left Arm, Patient Position: Sitting, Cuff Size: Normal)   Pulse 85   Temp 97.7 F (36.5 C) (Oral)   Ht 5' 9.5" (1.765 m)   Wt 167 lb 9.6 oz (76 kg)   SpO2 96%   BMI 24.40 kg/m  Wt Readings from Last 3 Encounters:  11/10/21 167 lb 9.6 oz (76 kg)  10/28/21 157 lb (71.2 kg)  10/20/21 157 lb (71.2 kg)     Health Maintenance Due  Topic Date Due   TETANUS/TDAP  12/07/2019   COVID-19 Vaccine (3 - Moderna risk series) 03/06/2020    There are no preventive care reminders to display for this patient.  Lab Results  Component Value Date   TSH 2.95 07/07/2021   Lab Results  Component Value Date   WBC 6.0 07/07/2021   HGB 14.6 07/07/2021   HCT 44.9 07/07/2021   MCV 93.0 07/07/2021   PLT 202.0 07/07/2021   Lab Results  Component Value Date   NA 137 07/07/2021   K 4.5 07/07/2021   CO2 28 07/07/2021   GLUCOSE 105 (H) 07/07/2021   BUN 12 07/07/2021   CREATININE  0.94 07/07/2021   BILITOT 0.8 07/07/2021   ALKPHOS 48 07/07/2021   AST 24 07/07/2021   ALT 13 07/07/2021   PROT 7.1 07/07/2021   ALBUMIN 4.1 07/07/2021   CALCIUM 8.7 07/07/2021   GFR 81.62 07/07/2021  Lab Results  Component Value Date   CHOL 250 (H) 07/07/2021   Lab Results  Component Value Date   HDL 59.10 07/07/2021   Lab Results  Component Value Date   LDLCALC 170 (H) 07/07/2021   Lab Results  Component Value Date   TRIG 103.0 07/07/2021   Lab Results  Component Value Date   CHOLHDL 4 07/07/2021   Lab Results  Component Value Date   HGBA1C 5.9 (A) 02/29/2020      Assessment & Plan:   Patient has history of intermittent arthralgias.  Clinically, no evidence to suggest polymyalgia rheumatica.  We reviewed clinical presentations for this.  He does not have any general involvement of his neck, upper back, shoulders, etc.  We did describe for him things to watch out for.  Do not see any indication for labs or trial of prednisone therapy at this time.  He does not have a distribution of joint pain consistent with polymyalgia rheumatica.  Also, after several minutes of movement his stiffness and soreness seem to improve.  No orders of the defined types were placed in this encounter.   Follow-up: No follow-ups on file.    Carolann Littler, MD

## 2021-12-09 ENCOUNTER — Other Ambulatory Visit: Payer: Self-pay | Admitting: Family Medicine

## 2021-12-22 ENCOUNTER — Ambulatory Visit: Payer: Medicare HMO | Admitting: Family Medicine

## 2022-01-14 ENCOUNTER — Encounter: Payer: Self-pay | Admitting: Family Medicine

## 2022-01-15 ENCOUNTER — Encounter: Payer: Self-pay | Admitting: Family Medicine

## 2022-01-15 NOTE — Telephone Encounter (Signed)
Waiting on forms

## 2022-01-15 NOTE — Telephone Encounter (Signed)
Patient stopped by office and filled out necessary paperwork and gave me forms. Given to [place], who took them to back     Daniel Sullivan

## 2022-01-19 ENCOUNTER — Encounter: Payer: Self-pay | Admitting: Family Medicine

## 2022-01-20 ENCOUNTER — Telehealth: Payer: Self-pay | Admitting: Family Medicine

## 2022-01-20 NOTE — Telephone Encounter (Signed)
Spoke with the patient this morning via mychart. He is aware that Dr. Elease Hashimoto has the paperwork for this and that he is out of the office.

## 2022-01-20 NOTE — Telephone Encounter (Signed)
Patient called in to ask Dr.Burchette "on the prescription for Hot Tub can a PA write a prescription?"  Please advise.

## 2022-01-26 DIAGNOSIS — L570 Actinic keratosis: Secondary | ICD-10-CM | POA: Diagnosis not present

## 2022-01-26 DIAGNOSIS — D225 Melanocytic nevi of trunk: Secondary | ICD-10-CM | POA: Diagnosis not present

## 2022-01-26 DIAGNOSIS — L821 Other seborrheic keratosis: Secondary | ICD-10-CM | POA: Diagnosis not present

## 2022-01-26 DIAGNOSIS — Z08 Encounter for follow-up examination after completed treatment for malignant neoplasm: Secondary | ICD-10-CM | POA: Diagnosis not present

## 2022-01-26 DIAGNOSIS — L814 Other melanin hyperpigmentation: Secondary | ICD-10-CM | POA: Diagnosis not present

## 2022-01-26 DIAGNOSIS — Z85828 Personal history of other malignant neoplasm of skin: Secondary | ICD-10-CM | POA: Diagnosis not present

## 2022-02-01 ENCOUNTER — Encounter: Payer: Self-pay | Admitting: Family Medicine

## 2022-02-01 ENCOUNTER — Ambulatory Visit (INDEPENDENT_AMBULATORY_CARE_PROVIDER_SITE_OTHER): Payer: Medicare HMO | Admitting: Family Medicine

## 2022-02-01 ENCOUNTER — Ambulatory Visit (INDEPENDENT_AMBULATORY_CARE_PROVIDER_SITE_OTHER): Payer: Medicare HMO

## 2022-02-01 ENCOUNTER — Other Ambulatory Visit: Payer: Self-pay

## 2022-02-01 VITALS — BP 140/90 | HR 84 | Resp 16 | Ht 69.5 in | Wt 166.1 lb

## 2022-02-01 DIAGNOSIS — Z23 Encounter for immunization: Secondary | ICD-10-CM

## 2022-02-01 DIAGNOSIS — R002 Palpitations: Secondary | ICD-10-CM | POA: Diagnosis not present

## 2022-02-01 DIAGNOSIS — R0781 Pleurodynia: Secondary | ICD-10-CM | POA: Diagnosis not present

## 2022-02-01 DIAGNOSIS — W19XXXA Unspecified fall, initial encounter: Secondary | ICD-10-CM

## 2022-02-01 DIAGNOSIS — S2242XA Multiple fractures of ribs, left side, initial encounter for closed fracture: Secondary | ICD-10-CM | POA: Diagnosis not present

## 2022-02-01 DIAGNOSIS — S41112A Laceration without foreign body of left upper arm, initial encounter: Secondary | ICD-10-CM

## 2022-02-01 DIAGNOSIS — Y92009 Unspecified place in unspecified non-institutional (private) residence as the place of occurrence of the external cause: Secondary | ICD-10-CM

## 2022-02-01 MED ORDER — TRAMADOL HCL 50 MG PO TABS: 50.0000 mg | ORAL_TABLET | Freq: Three times a day (TID) | ORAL | 0 refills | Status: AC | PRN

## 2022-02-01 NOTE — Patient Instructions (Addendum)
A few things to remember from today's visit:   Fall in home, initial encounter - Plan: DG Ribs Unilateral W/Chest Left  Costal margin pain - Plan: DG Ribs Unilateral W/Chest Left, traMADol (ULTRAM) 50 MG tablet  Skin tear of left upper arm without complication, initial encounter - Plan: traMADol (ULTRAM) 50 MG tablet  If you need refills please call your pharmacy. Do not use My Chart to request refills or for acute issues that need immediate attention.   Keep wound clear with soap and water, keep it uncovered most of the time. Cover it at bedtime. Tdap given today. Tramadol 3 times daily as needed.  Please be sure medication list is accurate. If a new problem present, please set up appointment sooner than planned today.

## 2022-02-01 NOTE — Progress Notes (Signed)
ACUTE VISIT Chief Complaint  Patient presents with   [place] last night & hurt back   HPI: Daniel Sullivan is a 71 y.o. male with hx of HTN,HLD,low testosterone, IBS, and allergies  here today complaining of left costal and thoracic back pain after falling last night. States that he was standing up closed to door, trying to put hoodie on, got frustrated, lost balance and fell backwards again door jam. He is having difficulty remembering if he fell, he thinks he did. Landed on right side, right UE excoriation, he covered area with bandage. No head trauma or LOC.  "Slight" chill and nausea after injury. He took Tramadol he had left from old prescription he got after dental procedure and naproxen. Sharp left-sided posterior rib cage pain, constant, exacerbated by movement,when getting up or sitting down,and by reaching. It is 4/10 at rest and 8/10 with movement.  Upper back pain, which he has had intermittently for a while. No associated numbness or tingling. Negative for cough,wheezing,or SOB. Pain is getting worse.  Immunization History  Administered Date(s) Administered   Fluad Quad(high Dose 65+) 08/06/2021   Influenza Whole 09/18/2010   Influenza, High Dose Seasonal PF 09/06/2017, 07/26/2019   Influenza,inj,Quad PF,6+ Mos 08/23/2013   Influenza-Unspecified 07/14/2018, 07/26/2019   Moderna Sars-Covid-2 Vaccination 01/08/2020, 02/07/2020   Pneumococcal Conjugate-13 04/01/2015   Pneumococcal Polysaccharide-23 03/09/2016   Td 12/06/2009   Tdap 02/01/2022   Zoster Recombinat (Shingrix) 08/03/2018, 02/29/2020   Zoster, Live 04/08/2015   Review of Systems  Constitutional:  Positive for activity change and fatigue. Negative for appetite change, diaphoresis and fever.  Cardiovascular:  Negative for chest pain, palpitations and leg swelling.  Gastrointestinal:  Negative for abdominal pain and vomiting.  Musculoskeletal:  Positive for back pain. Negative for gait problem.   Skin:  Positive for wound. Negative for pallor.  Neurological:  Negative for syncope and weakness.  Rest see pertinent positives and negatives per HPI.  Current Outpatient Medications on File Prior to Visit  Medication Sig Dispense Refill   amLODipine (NORVASC) 5 MG tablet TAKE 1 TABLET BY MOUTH EVERY DAY 90 tablet 1   Glucosamine-Chondroit-Vit C-Mn TABS Take 1 tablet by mouth daily.     losartan (COZAAR) 50 MG tablet TAKE 1 TABLET BY MOUTH EVERY DAY 90 tablet 3   Multiple Vitamins-Minerals (MENS MULTI VITAMIN & MINERAL PO) Take 1 tablet by mouth daily.     omeprazole (PRILOSEC) 20 MG capsule TAKE 1 CAPSULE BY MOUTH EVERY DAY 90 capsule 1   pravastatin (PRAVACHOL) 40 MG tablet TAKE 1 TABLET BY MOUTH EVERY DAY 90 tablet 0   sildenafil (VIAGRA) 100 MG tablet Take 0.5-1 tablets (50-100 mg total) by mouth daily as needed for erectile dysfunction. 10 tablet 5   vitamin C (ASCORBIC ACID) 500 MG tablet Take 500 mg by mouth daily.     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   Diverticulosis    Elevated PSA    GERD (gastroesophageal reflux disease)    Hemorrhoids    Hx of adenomatous colonic polyps  Hyperlipidemia    Hypertension    Hypogonadism in male    No Known Allergies  Social History   Socioeconomic History   Marital status: Widowed    Spouse name: Not on file   Number of children: Not on file   Years of education: Not on file   Highest education level: Bachelor's degree (e.g., BA, AB, BS)  Occupational History   Occupation: retired  Tobacco Use   Smoking status: Former    Packs/day: 0.50    Years:  15.00    Pack years: 7.50    Types: Cigarettes    Quit date: 02/24/1988    Years since quitting: 33.9   Smokeless tobacco: Never  Vaping Use   Vaping Use: Never used  Substance and Sexual Activity   Alcohol use: Yes    Comment: occasionally   Drug use: No   Sexual activity: Yes    Partners: Female  Other Topics Concern   Not on file  Social History Narrative   Married, retired   Former smoker   + [place], no drugs   Social Determinants of Radio broadcast assistant Strain: Low Risk    Difficulty of Paying Living Expenses: Not hard at all  Food Insecurity: No Food Insecurity   Worried About Charity fundraiser in the Last Year: Never true   Arboriculturist in the Last Year: Never true  Transportation Needs: No Transportation Needs   Lack of Transportation (Medical): No   Lack of Transportation (Non-Medical): No  Physical Activity: Insufficiently Active   Days of Exercise per Week: 3 days   Minutes of Exercise per Session: 40 min  Stress: No Stress Concern Present   Feeling of Stress : Only a little  Social Connections: Unknown   Frequency of Communication with Friends and Family: More than three times a week   Frequency of Social Gatherings with Friends and Family: Three times a week   Attends Religious Services: Patient refused   Active Member of Clubs or Organizations: No   Attends Archivist Meetings: Not on file   Marital Status: Living with partner   Vitals:   02/01/22 1455  BP: 140/90  Pulse: 84  Resp: 16  SpO2: 97%   Body mass index is 24.18 kg/m.  Physical Exam Vitals and nursing note reviewed.  HENT:     Head: Normocephalic and atraumatic.  Eyes:     Conjunctiva/sclera: Conjunctivae normal.  Cardiovascular:     Rate and Rhythm: Normal rate.     Heart sounds: No murmur heard. Pulmonary:     Effort: Pulmonary effort is normal. No respiratory distress.     Breath sounds: Normal breath sounds.  Musculoskeletal:     Cervical back: No  tenderness or bony tenderness.     Thoracic back: Tenderness present. No bony tenderness.     Lumbar back: No tenderness or bony tenderness.       Back:  Skin:      Neurological:     General: No focal deficit present.     Mental Status: He is alert and oriented to person, place, and time.     Comments: Stable gait, not assisted,   ASSESSMENT AND PLAN:  Daniel Sullivan was seen today for fall.  Diagnoses and all orders for this visit: Orders Placed This Encounter  Procedures   DG Ribs Unilateral W/Chest Left   Tdap vaccine greater than or equal to 7yo IM   Fall in home, initial encounter Fall precautions  discussed. I do not think PT is needed at this time.  Costal margin pain ? Rib fracture. Rib X ray ordered. Tramadol side effects discussed. Avoid shallow breathing. Instructed about warning signs.  -     traMADol (ULTRAM) 50 MG tablet; Take 1 tablet (50 mg total) by mouth every 8 (eight) hours as needed for up to 7 days.  Skin tear of left upper arm without complication, initial encounter Wound clean with water and alcohol, skin flap repositioned to cover wound. Covered with abx oint,gauze,and wrapped with adhesive bandage. Instructed to keep would clean with soap and water,uncovered during the day and wrap at night. Tdap vaccination updated. Monitor fro signs of infection.  -     traMADol (ULTRAM) 50 MG tablet; Take 1 tablet (50 mg total) by mouth every 8 (eight) hours as needed for up to 7 days. -     Tdap vaccine greater than or equal to 7yo IM  Return if symptoms worsen or fail to improve.  Ellorie Kindall G. [place], MD  [facility]. [place] office.

## 2022-02-02 ENCOUNTER — Telehealth: Payer: Self-pay | Admitting: Family Medicine

## 2022-02-02 ENCOUNTER — Encounter: Payer: Self-pay | Admitting: Family Medicine

## 2022-02-02 ENCOUNTER — Ambulatory Visit (INDEPENDENT_AMBULATORY_CARE_PROVIDER_SITE_OTHER): Payer: Medicare HMO | Admitting: Family Medicine

## 2022-02-02 VITALS — BP 124/72 | HR 92 | Temp 97.7°F | Ht 69.5 in | Wt 165.0 lb

## 2022-02-02 DIAGNOSIS — R0781 Pleurodynia: Secondary | ICD-10-CM

## 2022-02-02 MED ORDER — HYDROCODONE-ACETAMINOPHEN 5-325 MG PO TABS: 1.0000 | ORAL_TABLET | Freq: Four times a day (QID) | ORAL | 0 refills | Status: DC | PRN

## 2022-02-02 MED ORDER — HYDROCODONE-ACETAMINOPHEN 10-325 MG PO TABS
1.0000 | ORAL_TABLET | Freq: Four times a day (QID) | ORAL | 0 refills | Status: AC | PRN
Start: 2022-02-02 — End: 2022-02-07

## 2022-02-02 NOTE — Patient Instructions (Signed)
Be sure to take several deep breaths daily  We should have the X-ray over-read some time later today.   Watch for constipation and dizziness with the pain medication

## 2022-02-02 NOTE — Telephone Encounter (Signed)
Noted  

## 2022-02-02 NOTE — Telephone Encounter (Signed)
Pt has sent mychart message and would like md to take a look at message and to please response. Pt is having back pain and does have an appt tomorrow. Please advise

## 2022-02-02 NOTE — Telephone Encounter (Signed)
Please let him know I will send in some hydrocodone/Vicodin and see if we can get him in tomorrow if he would like to reassess.  We have several openings.  Make sure he is aware that hydrocodone may be very sedating

## 2022-02-02 NOTE — Telephone Encounter (Signed)
Called pt to inform him about the prescription--pt noted verbal understanding

## 2022-02-02 NOTE — Telephone Encounter (Signed)
Medication prescribed this morning is unavailable -- it is available for the 7.5 or the 10 mg.  Very limited supply left so wants to get a quick prescription called in.

## 2022-02-02 NOTE — Progress Notes (Signed)
Established Patient Office Visit  Subjective:  Patient ID: Daniel Sullivan, male    DOB: 1951-08-31  Age: 71 y.o. MRN: [id number]  CC:  Chief Complaint  Patient presents with   Back Pain    HPI [name] presents for follow-up regarding injuries from a fall at home which occurred couple days ago.  He was seen here yesterday in office for the injuries.  He basically was taking his shirt off and this got tangled up and he lost his balance and fell backwards against the door jam and landed in his right side.  No head trauma.  No loss of consciousness.  He also had a small tear in the skin on his arm.  Was seen here yesterday and x-rays were taken.  These have not been over read yet.  He was given some tramadol but had severe pain last night interfering with sleep.  Pain was 10 out of 10 at times.  Worse with movement.  Some pain with deep breathing.  No hemoptysis.  No fever.  This morning we actually sent in some limited hydrocodone and he took 1 this morning and pain is somewhat improved at this time.  He denies any dizziness or sedation issues.  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   Diverticulosis    Elevated PSA    GERD (gastroesophageal reflux disease)    Hemorrhoids    Hx of adenomatous colonic polyps    Hyperlipidemia    Hypertension    Hypogonadism in male     Past Surgical History:  Procedure Laterality Date   ANAL RECTAL MANOMETRY N/A 09/07/2017   Procedure: ANO RECTAL MANOMETRY;  Surgeon: Mauri Pole, MD;  Location: [facility];  Service: Endoscopy;  Laterality: N/A;   COLONOSCOPY W/ BIOPSIES     HEMORRHOID BANDING      Family History  Problem Relation Age of Onset   Alcohol abuse Father    Suicidality Father    Melanoma Brother 15   Stomach cancer Neg Hx    Colon cancer Neg Hx    Rectal cancer Neg Hx    Esophageal cancer Neg Hx     Social History    Socioeconomic History   Marital status: Widowed    Spouse name: Not on file   Number of children: Not on file   Years of education: Not on file   Highest education level: Bachelor's degree (e.g., BA, AB, BS)  Occupational History   Occupation: retired  Tobacco Use   Smoking status: Former    Packs/day: 0.50    Years: 15.00    Pack years: 7.50    Types: Cigarettes    Quit date: 02/24/1988    Years since quitting: 33.9   Smokeless tobacco: Never  Vaping Use   Vaping Use: Never used  Substance and Sexual Activity   Alcohol use: Yes    Comment: occasionally   Drug use: No   Sexual activity: Yes    Partners: Female  Other Topics Concern   Not on file  Social History Narrative   Married, retired   Former smoker   + [place], no drugs   Social Determinants of Radio broadcast assistant Strain: Low Risk    Difficulty of Paying Living Expenses: Not hard at all  Food Insecurity: No Food Insecurity   Worried About  Running Out of Food in the Last Year: Never true   Ran Out of Food in the Last Year: Never true  Transportation Needs: No Transportation Needs   Lack of Transportation (Medical): No   Lack of Transportation (Non-Medical): No  Physical Activity: Insufficiently Active   Days of Exercise per Week: 3 days   Minutes of Exercise per Session: 40 min  Stress: No Stress Concern Present   Feeling of Stress : Only a little  Social Connections: Unknown   Frequency of Communication with Friends and Family: More than three times a week   Frequency of Social Gatherings with Friends and Family: Three times a week   Attends Religious Services: Patient refused   Active Member of Clubs or Organizations: No   Attends Music therapist: Not on file   Marital Status: Living with partner  Intimate Partner Violence: Not on file    Outpatient Medications Prior to Visit  Medication Sig Dispense Refill   amLODipine (NORVASC) 5 MG tablet TAKE 1 TABLET BY MOUTH EVERY DAY 90  tablet 1   Glucosamine-Chondroit-Vit C-Mn TABS Take 1 tablet by mouth daily.     HYDROcodone-acetaminophen (NORCO) 10-325 MG tablet Take 1 tablet by mouth every 6 (six) hours as needed for up to 5 days. 20 tablet 0   losartan (COZAAR) 50 MG tablet TAKE 1 TABLET BY MOUTH EVERY DAY 90 tablet 3   Multiple Vitamins-Minerals (MENS MULTI VITAMIN & MINERAL PO) Take 1 tablet by mouth daily.     omeprazole (PRILOSEC) 20 MG capsule TAKE 1 CAPSULE BY MOUTH EVERY DAY 90 capsule 1   pravastatin (PRAVACHOL) 40 MG tablet TAKE 1 TABLET BY MOUTH EVERY DAY 90 tablet 0   sildenafil (VIAGRA) 100 MG tablet Take 0.5-1 tablets (50-100 mg total) by mouth daily as needed for erectile dysfunction. 10 tablet 5   traMADol (ULTRAM) 50 MG tablet Take 1 tablet (50 mg total) by mouth every 8 (eight) hours as needed for up to 7 days. 21 tablet 0   vitamin C (ASCORBIC ACID) 500 MG tablet Take 500 mg by mouth daily.     Facility-Administered Medications Prior to Visit  Medication Dose Route Frequency Provider Last Rate Last Admin   testosterone cypionate (DEPOTESTOSTERONE CYPIONATE) injection 200 mg  200 mg Intramuscular Q14 Days Eulas Post, MD   200 mg at 07/17/21 1456    No Known Allergies  ROS Review of Systems  Constitutional:  Negative for chills and fever.  Respiratory:  Negative for cough and shortness of breath.   Neurological:  Negative for dizziness and headaches.     Objective:    Physical Exam Vitals reviewed.  Constitutional:      Appearance: Normal appearance.  Cardiovascular:     Rate and Rhythm: Normal rate and regular rhythm.  Pulmonary:     Effort: Pulmonary effort is normal.     Breath sounds: Normal breath sounds.  Musculoskeletal:     Comments: He has no visible bruising or swelling of the rib cage area.  Right rib cage region reveals tenderness over 1 area laterally and another area posteriorly around the ninth the 10th rib region.  Neurological:     Mental Status: He is alert.     BP 124/72 (BP Location: Left Arm, Patient Position: Sitting, Cuff Size: Normal)    Pulse 92    Temp 97.7 F (36.5 C) (Oral)    Ht 5' 9.5" (1.765 m)    Wt 165 lb (74.8 kg)  BMI 24.02 kg/m  Wt Readings from Last 3 Encounters:  02/02/22 165 lb (74.8 kg)  02/01/22 166 lb 2 oz (75.4 kg)  11/10/21 167 lb 9.6 oz (76 kg)     There are no preventive care reminders to display for this patient.  There are no preventive care reminders to display for this patient.  Lab Results  Component Value Date   TSH 2.95 07/07/2021   Lab Results  Component Value Date   WBC 6.0 07/07/2021   HGB 14.6 07/07/2021   HCT 44.9 07/07/2021   MCV 93.0 07/07/2021   PLT 202.0 07/07/2021   Lab Results  Component Value Date   NA 137 07/07/2021   K 4.5 07/07/2021   CO2 28 07/07/2021   GLUCOSE 105 (H) 07/07/2021   BUN 12 07/07/2021   CREATININE 0.94 07/07/2021   BILITOT 0.8 07/07/2021   ALKPHOS 48 07/07/2021   AST 24 07/07/2021   ALT 13 07/07/2021   PROT 7.1 07/07/2021   ALBUMIN 4.1 07/07/2021   CALCIUM 8.7 07/07/2021   GFR 81.62 07/07/2021   Lab Results  Component Value Date   CHOL 250 (H) 07/07/2021   Lab Results  Component Value Date   HDL 59.10 07/07/2021   Lab Results  Component Value Date   LDLCALC 170 (H) 07/07/2021   Lab Results  Component Value Date   TRIG 103.0 07/07/2021   Lab Results  Component Value Date   CHOLHDL 4 07/07/2021   Lab Results  Component Value Date   HGBA1C 5.9 (A) 02/29/2020      Assessment & Plan:   Patient presents with worsening rib pain following fall as above.  X-ray over read pending.  We sent in some limited hydrocodone this morning.  He is cautioned about potential constipation with opioid.  Suggested consider stool softener and stay well-hydrated.  He is also cautioned about shallow breathing and encouraged to take several deep breaths per day.  He has had some severe pain with coughing or sneezing.  We did provide 6 since Ace wrap and  wrap the area of severe pain with caution not to restrict his breathing in any way.  We explained that he can only wear this if he is taking full deep breaths several times daily    No orders of the defined types were placed in this encounter.   Follow-up: No follow-ups on file.    Carolann Littler, MD

## 2022-02-02 NOTE — Telephone Encounter (Signed)
I sent in the 10 mg dose  

## 2022-02-03 ENCOUNTER — Ambulatory Visit: Payer: Medicare HMO | Admitting: Family Medicine

## 2022-02-06 ENCOUNTER — Other Ambulatory Visit: Payer: Self-pay | Admitting: Family Medicine

## 2022-02-16 ENCOUNTER — Ambulatory Visit: Payer: Medicare HMO | Admitting: Family Medicine

## 2022-02-16 ENCOUNTER — Encounter: Payer: Self-pay | Admitting: Family Medicine

## 2022-02-16 ENCOUNTER — Ambulatory Visit (INDEPENDENT_AMBULATORY_CARE_PROVIDER_SITE_OTHER): Payer: Medicare HMO | Admitting: Family Medicine

## 2022-02-16 VITALS — BP 130/76 | HR 90 | Temp 97.5°F | Ht 69.5 in | Wt 166.4 lb

## 2022-02-16 DIAGNOSIS — I10 Essential (primary) hypertension: Secondary | ICD-10-CM | POA: Diagnosis not present

## 2022-02-16 DIAGNOSIS — S2242XD Multiple fractures of ribs, left side, subsequent encounter for fracture with routine healing: Secondary | ICD-10-CM

## 2022-02-16 DIAGNOSIS — I7300 Raynaud's syndrome without gangrene: Secondary | ICD-10-CM

## 2022-02-16 MED ORDER — TRAMADOL HCL 50 MG PO TABS: 50.0000 mg | ORAL_TABLET | Freq: Four times a day (QID) | ORAL | 0 refills | Status: AC | PRN

## 2022-02-16 MED ORDER — LIDOCAINE 5 % EX PTCH: 1.0000 | MEDICATED_PATCH | CUTANEOUS | 0 refills | Status: DC

## 2022-02-16 NOTE — Progress Notes (Signed)
Established Patient Office Visit  Subjective:  Patient ID: Daniel Sullivan, male    DOB: [birth year]  Age: 71 y.o. MRN: [id number]  CC:  Chief Complaint  Patient presents with   Chest Pain    HPI Daniel Sullivan presents for for the following items: He had a fall at home prior to last visit.  He was taking his shirt off and got tangled up and lost his balance and fell against the door jam.  Last note made mention of right-sided rib pain but his pain is actually left-sided.  He had x-rays which showed mildly displaced fractures of the eighth, ninth, and 10th ribs on the left side with possible seventh fracture as well.  He was given some pain medication but unfortunately had constipation.  He had some leftover tramadol which she has taken occasionally.  Currently pain is about 6 out of 10.  Worse at night.  He is forcing himself to take several deep breaths and pain is slightly better at this time.  Pain had been 10 out of 10 at times.  No cough.  No fever.  He relates intermittent discoloration of hands especially with cold temperatures such as holding a cold ice glass.  He describes some probable Raynaud's changes worse fingers will turn white and then will reperfuse with heat.  Occasional numbness or tingling sensation.  No significant pain.  Quit smoking in 1989.  No recent gangrene changes.  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   Diverticulosis    Elevated PSA    GERD (gastroesophageal reflux disease)    Hemorrhoids    Hx of adenomatous colonic polyps    Hyperlipidemia    Hypertension    Hypogonadism in male     Past Surgical History:  Procedure Laterality Date   ANAL RECTAL MANOMETRY N/A 09/07/2017   Procedure: ANO RECTAL MANOMETRY;  Surgeon: Napoleon Form, MD;  Location: [facility];  Service: Endoscopy;  Laterality: N/A;   COLONOSCOPY  W/ BIOPSIES     HEMORRHOID BANDING      Family History  Problem Relation Age of Onset   Alcohol abuse Father    Suicidality Father    Melanoma Brother 40   Stomach cancer Neg Hx    Colon cancer Neg Hx    Rectal cancer Neg Hx    Esophageal cancer Neg Hx     Social History   Socioeconomic History   Marital status: Widowed    Spouse name: Not on file   Number of children: Not on file   Years of education: Not on file   Highest education level: Bachelor's degree (e.g., BA, AB, BS)  Occupational History   Occupation: retired  Tobacco Use   Smoking status: Former    Packs/day: 0.50    Years: 15.00    Pack years: 7.50    Types: Cigarettes    Quit date: 02/24/1988    Years since quitting: 34.0   Smokeless tobacco: Never  Vaping Use   Vaping Use: Never used  Substance and Sexual Activity   Alcohol use: Yes    Comment: occasionally   Drug use: No   Sexual activity: Yes    Partners: Female  Other Topics Concern   Not on file  Social History Narrative   Married, retired   Former  smoker   + RtOH, no drugs   Social Determinants of Corporate investment banker Strain: Low Risk    Difficulty of Paying Living Expenses: Not hard at all  Food Insecurity: No Food Insecurity   Worried About Programme researcher, broadcasting/film/video in the Last Year: Never true   Barista in the Last Year: Never true  Transportation Needs: No Transportation Needs   Lack of Transportation (Medical): No   Lack of Transportation (Non-Medical): No  Physical Activity: Insufficiently Active   Days of Exercise per Week: 3 days   Minutes of Exercise per Session: 40 min  Stress: No Stress Concern Present   Feeling of Stress : Only a little  Social Connections: Unknown   Frequency of Communication with Friends and Family: More than three times a week   Frequency of Social Gatherings with Friends and Family: Three times a week   Attends Religious Services: Patient refused   Active Member of Clubs or Organizations:  No   Attends Engineer, structural: Not on file   Marital Status: Living with partner  Intimate Partner Violence: Not on file    Outpatient Medications Prior to Visit  Medication Sig Dispense Refill   amLODipine (NORVASC) 5 MG tablet TAKE 1 TABLET BY MOUTH EVERY DAY 90 tablet 1   Glucosamine-Chondroit-Vit C-Mn TABS Take 1 tablet by mouth daily.     losartan (COZAAR) 50 MG tablet TAKE 1 TABLET BY MOUTH EVERY DAY 90 tablet 3   Multiple Vitamins-Minerals (MENS MULTI VITAMIN & MINERAL PO) Take 1 tablet by mouth daily.     omeprazole (PRILOSEC) 20 MG capsule TAKE 1 CAPSULE BY MOUTH EVERY DAY 90 capsule 1   pravastatin (PRAVACHOL) 40 MG tablet TAKE 1 TABLET BY MOUTH EVERY DAY 90 tablet 0   sildenafil (VIAGRA) 100 MG tablet Take 0.5-1 tablets (50-100 mg total) by mouth daily as needed for erectile dysfunction. 10 tablet 5   vitamin C (ASCORBIC ACID) 500 MG tablet Take 500 mg by mouth daily.     Facility-Administered Medications Prior to Visit  Medication Dose Route Frequency Provider Last Rate Last Admin   testosterone cypionate (DEPOTESTOSTERONE CYPIONATE) injection 200 mg  200 mg Intramuscular Q14 Days Kristian Covey, MD   200 mg at 07/17/21 1456    No Known Allergies  ROS Review of Systems  Constitutional:  Negative for fatigue.  Eyes:  Negative for visual disturbance.  Respiratory:  Negative for cough, chest tightness, shortness of breath and wheezing.   Cardiovascular:  Negative for palpitations and leg swelling.  Gastrointestinal:  Negative for abdominal pain.  Neurological:  Negative for dizziness, syncope, weakness, light-headedness and headaches.     Objective:    Physical Exam Vitals reviewed.  Constitutional:      Appearance: He is well-developed.  Cardiovascular:     Comments: Regular rhythm and rate.  He has excellent distal pulses radius and ulnar bilaterally with good capillary refill in all digits of the hand. Pulmonary:     Effort: Pulmonary effort is  normal.     Comments: Symmetric breath sounds.  He does have some mild swelling left lateral lower rib cage area with tenderness around the eighth through 10th ribs region. Neurological:     Mental Status: He is alert.    BP 130/76 (BP Location: Left Arm, Patient Position: Sitting, Cuff Size: Normal)   Pulse 90   Temp (!) 97.5 F (36.4 C) (Oral)   Ht 5' 9.5" (1.765 m)   Wt  166 lb 6.4 oz (75.5 kg)   SpO2 95%   BMI 24.22 kg/m  Wt Readings from Last 3 Encounters:  02/16/22 166 lb 6.4 oz (75.5 kg)  02/02/22 165 lb (74.8 kg)  02/01/22 166 lb 2 oz (75.4 kg)     There are no preventive care reminders to display for this patient.  There are no preventive care reminders to display for this patient.  Lab Results  Component Value Date   TSH 2.95 07/07/2021   Lab Results  Component Value Date   WBC 6.0 07/07/2021   HGB 14.6 07/07/2021   HCT 44.9 07/07/2021   MCV 93.0 07/07/2021   PLT 202.0 07/07/2021   Lab Results  Component Value Date   NA 137 07/07/2021   K 4.5 07/07/2021   CO2 28 07/07/2021   GLUCOSE 105 (H) 07/07/2021   BUN 12 07/07/2021   CREATININE 0.94 07/07/2021   BILITOT 0.8 07/07/2021   ALKPHOS 48 07/07/2021   AST 24 07/07/2021   ALT 13 07/07/2021   PROT 7.1 07/07/2021   ALBUMIN 4.1 07/07/2021   CALCIUM 8.7 07/07/2021   GFR 81.62 07/07/2021   Lab Results  Component Value Date   CHOL 250 (H) 07/07/2021   Lab Results  Component Value Date   HDL 59.10 07/07/2021   Lab Results  Component Value Date   LDLCALC 170 (H) 07/07/2021   Lab Results  Component Value Date   TRIG 103.0 07/07/2021   Lab Results  Component Value Date   CHOLHDL 4 07/07/2021   Lab Results  Component Value Date   HGBA1C 5.9 (A) 02/29/2020      Assessment & Plan:   #1 multiple left-sided closed rib fractures from recent fall at home.  Patient only slightly improved from last visit.  He had constipation and intolerance of hydrocodone.  -We discussed trial of tramadol 50 mg  every 6 hours as needed -Also discussed possible trial of lidocaine 5% patches 12 hours on 12 hours off -Continue frequent deep breathing -Hopefully pain will be improving substantially over the next couple weeks  #2 Raynaud's phenomenon.  Normal exam at this time.  Symptoms consistently occurring with cold temperatures.  Good palpable pulses.  Patient already on amlodipine.  Observe for now unless symptoms progress -No nicotine use now on several years  #3 hypertension stable and well-controlled with amlodipine and losartan  Meds ordered this encounter  Medications   traMADol (ULTRAM) 50 MG tablet    Sig: Take 1 tablet (50 mg total) by mouth every 6 (six) hours as needed for up to 5 days.    Dispense:  30 tablet    Refill:  0   lidocaine (LIDODERM) 5 %    Sig: Place 1 patch onto the skin daily. Remove & Discard patch within 12 hours or as directed by MD    Dispense:  30 patch    Refill:  0    Follow-up: No follow-ups on file.    Evelena Peat, MD

## 2022-02-17 ENCOUNTER — Telehealth: Payer: Self-pay

## 2022-02-17 NOTE — Telephone Encounter (Signed)
I received PA request for the pt's Lidocaine patches. The PA has been denied by the pt' insurance for the following reason: ? ?"You asked for the drug above for your multiple left-sided closed rib fractures. This is an off-label use that is not medically accepted. The Medicare rule in the Prescription Drug Benefit Manual (Chapter 6, Section 10.6) says a drug must be used for a medically accepted indication (covered use). Off-label use is medically accepted when there is proof in one or more of the drug guides that the drug works for your condition. We look at the two major drug guides (compendia): the [place] and the [place] (AHFS-DI). [employer] has decided that there is no proof in either drug guide that this drug works for your condition. Per Medicare rules, it is not covered." ? ?Key- [id number]  ?

## 2022-02-17 NOTE — Telephone Encounter (Signed)
Pt informed and stated he would buy Patches OTC  ?

## 2022-03-22 ENCOUNTER — Other Ambulatory Visit: Payer: Self-pay | Admitting: Family Medicine

## 2022-03-28 ENCOUNTER — Encounter (HOSPITAL_COMMUNITY): Payer: Self-pay

## 2022-03-28 ENCOUNTER — Ambulatory Visit (HOSPITAL_COMMUNITY)
Admission: EM | Admit: 2022-03-28 | Discharge: 2022-03-28 | Disposition: A | Discharge status: Home / Self Care | Payer: Medicare HMO

## 2022-03-28 DIAGNOSIS — R2241 Localized swelling, mass and lump, right lower limb: Secondary | ICD-10-CM | POA: Diagnosis not present

## 2022-03-28 DIAGNOSIS — M79661 Pain in right lower leg: Secondary | ICD-10-CM

## 2022-03-28 NOTE — ED Provider Notes (Signed)
?[facility] ? ? ? ?CSN: [id number] ?Arrival date & time: 03/28/22  1146 ? ? ?  ? ?History   ?Chief Complaint ?Chief Complaint  ?Patient presents with  ? Leg Pain  ? ? ?HPI ?Daniel Sullivan is a 71 y.o. male.  ? ?Patient presents with right lower extremity pain, redness, and swelling that has been ongoing for the past few days.  He reports it started on Friday.  He also reports on Friday, when he was taken off his grill cover, he scraped his right shin on his deck.  He does not remember if this happened before after the pain, swelling, and redness in his right leg started.  He had a visit over the phone today and the patient reports the provider was concerned for blood clot, so he came to urgent care.  He denies any new numbness or tingling in his toes, decreased range of motion.  He does stay very active bike riding and being outdoors.  He denies any recent long trips in the car or on an airplane or any recent significant change in activity. ? ?He denies any personal history of blood clot or family history of blood clotting disorder or blood clots that he knows of.  He denies any chest pain or shortness of breath today. ? ? ?Past Medical History:  ?Diagnosis Date  ? Allergy   ? Arthritis   ? Asthma   ? Cancer [facility])   ? 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 Diverticulosis   ? Elevated PSA   ? GERD (gastroesophageal reflux disease)   ? Hemorrhoids   ? Hx of adenomatous colonic polyps   ? Hyperlipidemia   ? Hypertension   ? Hypogonadism in male   ? ? ?Patient Active Problem List  ? Diagnosis Date Noted  ? IBS (irritable bowel syndrome) 01/09/2019  ? Hemorrhoids with complication 16/12/930  ? Incontinence of feces with fecal urgency   ? Prediabetes 03/26/2014  ? Onychomycosis 12/28/2013  ? Pain, foot 12/28/2013  ? Metatarsalgia of both feet 06/22/2013  ? Metatarsal deformity 06/22/2013  ? ELEVATED PROSTATE SPECIFIC ANTIGEN 09/18/2010  ? Low testosterone 08/17/2010  ? Hyperlipidemia 08/17/2010  ? Essential  hypertension 08/17/2010  ? ALLERGIC RHINITIS 08/17/2010  ? GERD 08/17/2010  ? ? ?Past Surgical History:  ?Procedure Laterality Date  ? ANAL RECTAL MANOMETRY N/A 09/07/2017  ? Procedure: ANO RECTAL MANOMETRY;  Surgeon: Mauri Pole, MD;  Location: [facility];  Service: Endoscopy;  Laterality: N/A;  ? COLONOSCOPY W/ BIOPSIES    ? HEMORRHOID BANDING    ? ? ? ? ? ?Home Medications   ? ?Prior to Admission medications   ?Medication Sig Start Date End Date Taking? Authorizing Provider  ?amLODipine (NORVASC) 5 MG tablet TAKE 1 TABLET BY MOUTH EVERY DAY 11/10/21   Burchette, Alinda Sierras, MD  ?Glucosamine-Chondroit-Vit C-Mn TABS Take 1 tablet by mouth daily.    [provider]  ?lidocaine (LIDODERM) 5 % Place 1 patch onto the skin daily. Remove & Discard patch within 12 hours or as directed by MD 02/16/22   Burchette, Alinda Sierras, MD  ?losartan (COZAAR) 50 MG tablet TAKE 1 TABLET BY MOUTH EVERY DAY 08/03/21   Burchette, Alinda Sierras, MD  ?Multiple Vitamins-Minerals (MENS MULTI VITAMIN & MINERAL PO) Take 1 tablet by mouth daily.    [provider]  ?omeprazole (PRILOSEC) 20 MG capsule TAKE 1 CAPSULE BY MOUTH EVERY DAY 12/10/21   Burchette, Alinda Sierras, MD  ?pravastatin (PRAVACHOL) 40  MG tablet TAKE 1 TABLET BY MOUTH EVERY DAY 02/08/22   Burchette, Alinda Sierras, MD  ?sildenafil (VIAGRA) 100 MG tablet TAKE 0.5-1 TABLETS BY MOUTH DAILY AS NEEDED FOR ERECTILE DYSFUNCTION. 03/22/22   Burchette, Alinda Sierras, MD  ?vitamin C (ASCORBIC ACID) 500 MG tablet Take 500 mg by mouth daily.    [provider]  ? ? ?Family History ?Family History  ?Problem Relation Age of Onset  ? Alcohol abuse Father   ? Suicidality Father   ? Melanoma Brother 17  ? Stomach cancer Neg Hx   ? Colon cancer Neg Hx   ? Rectal cancer Neg Hx   ? Esophageal cancer Neg Hx   ? ? ?Social History ?Social History  ? ?Tobacco Use  ? Smoking status: Former  ?  Packs/day: 0.50  ?  Years: 15.00  ?  Pack years: 7.50  ?  Types: Cigarettes  ?  Quit date: 02/24/1988  ?  Years  since quitting: 34.1  ? Smokeless tobacco: Never  ?Vaping Use  ? Vaping Use: Never used  ?Substance Use Topics  ? Alcohol use: Yes  ?  Comment: occasionally  ? Drug use: No  ? ? ? ?Allergies   ?Patient has no known allergies. ? ? ?Review of Systems ?Review of Systems ?Per HPI ? ?Physical Exam ?Triage Vital Signs ?ED Triage Vitals  ?Enc Vitals Group  ?   BP 03/28/22 1200 (!) 162/76  ?   Pulse --   ?   Resp 03/28/22 1200 18  ?   Temp 03/28/22 1200 98.3 ?F (36.8 ?C)  ?   Temp Source 03/28/22 1200 Oral  ?   SpO2 03/28/22 1200 98 %  ?   Weight --   ?   Height --   ?   Head Circumference --   ?   Peak Flow --   ?   Pain Score 03/28/22 1201 2  ?   Pain Loc --   ?   Pain Edu? --   ?   Excl. in [place]? --   ? ?No data found. ? ?Updated Vital Signs ?BP (!) 162/76 (BP Location: Left Arm)   Temp 98.3 ?F (36.8 ?C) (Oral)   Resp 18   SpO2 98%  ? ?Visual Acuity ?Right Eye Distance:   ?Left Eye Distance:   ?Bilateral Distance:   ? ?Right Eye Near:   ?Left Eye Near:    ?Bilateral Near:    ? ?Physical Exam ?Vitals and nursing note reviewed.  ?Constitutional:   ?   General: He is not in acute distress. ?   Appearance: Normal appearance. He is not ill-appearing or toxic-appearing.  ?Pulmonary:  ?   Effort: Pulmonary effort is normal. No respiratory distress.  ?Musculoskeletal:  ?   Right upper leg: Normal. No swelling, edema, deformity or tenderness.  ?   Right knee: No swelling, deformity or bony tenderness. Normal range of motion. Tenderness present over the PCL. Normal pulse.  ?   Left knee: Normal.  ?   Right lower leg: Tenderness present. No bony tenderness. 2+ Pitting Edema present.  ?   Left lower leg: Normal. No edema.  ?   Right ankle: Swelling present. No ecchymosis. No tenderness. Normal range of motion. Normal pulse.  ?   Right foot: Normal. Normal range of motion and normal capillary refill. No bony tenderness. Normal pulse.  ?   Comments: TTP in the posterior knee; there is pitting edema and mild erythema to the right  lower  extremity around the area of the wound.  No red streaking up leg.  ?Skin: ?   General: Skin is warm and dry.  ?   Coloration: Skin is not jaundiced or pale.  ?   Findings: Erythema present.  ?Neurological:  ?   Mental Status: He is alert and oriented to person, place, and time.  ?Psychiatric:     ?   Behavior: Behavior is cooperative.  ? ? ? ?UC Treatments / Results  ?Labs ?(all labs ordered are listed, but only abnormal results are displayed) ?Labs Reviewed - No data to display ? ?EKG ? ? ?Radiology ?No results found. ? ?Procedures ?Procedures (including critical care time) ? ?Medications Ordered in UC ?Medications - No data to display ? ?Initial Impression / Assessment and Plan / UC Course  ?I have reviewed the triage vital signs and the nursing notes. ? ?Pertinent labs & imaging results that were available during my care of the patient were reviewed by me and considered in my medical decision making (see chart for details). ? ?  ?Patient is a very pleasant 71 year old male with concern for right lower extremity deep vein thrombosis.  We will order Doppler ultrasound of right lower extremity today, however we discussed that heart and vascular is not open today and we will schedule him an appointment first thing in the morning tomorrow.  In the meantime, we discussed the patient develops more significant pain in his calf, chest pain, or shortness of breath, he needs to be seen emergently.  If the Doppler tomorrow is negative for deep vein thrombosis, we can treat patient for cellulitis with cephalexin.  The patient was given the opportunity to ask questions.  All questions answered to their satisfaction.  The patient is in agreement to this plan.  ? ?Final Clinical Impressions(s) / UC Diagnoses  ? ?Final diagnoses:  ?Localized swelling of right lower extremity  ?Right calf pain  ? ? ? ?Discharge Instructions   ? ?  ?We will schedule you an appointment tomorrow to have the ultrasound of your right leg to check  for a blood clot ?If this is negative, we can treat you for cellulitis of your right leg ? ?If your pain worsens overnight or if you develop chest pain or shortness of breath, please go to the Emergency Room ?

## 2022-03-28 NOTE — Discharge Instructions (Addendum)
We will schedule you an appointment tomorrow to have the ultrasound of your right leg to check for a blood clot ?If this is negative, we can treat you for cellulitis of your right leg ? ?If your pain worsens overnight or if you develop chest pain or shortness of breath, please go to the Emergency Room ?

## 2022-03-28 NOTE — ED Triage Notes (Signed)
Onset 3 days ago pt reports right leg calf pain and pressure. This morning he notice some swelling in his calf.  ?

## 2022-03-29 ENCOUNTER — Telehealth (HOSPITAL_COMMUNITY): Payer: Self-pay

## 2022-03-29 ENCOUNTER — Other Ambulatory Visit: Payer: Self-pay

## 2022-03-29 ENCOUNTER — Other Ambulatory Visit (HOSPITAL_COMMUNITY): Payer: Self-pay

## 2022-03-29 ENCOUNTER — Emergency Department (HOSPITAL_COMMUNITY)
Admission: EM | Admit: 2022-03-29 | Discharge: 2022-03-29 | Disposition: A | Discharge status: Home / Self Care | Payer: Medicare HMO | Attending: Emergency Medicine | Admitting: Emergency Medicine

## 2022-03-29 ENCOUNTER — Telehealth (HOSPITAL_COMMUNITY): Payer: Self-pay | Admitting: Internal Medicine

## 2022-03-29 ENCOUNTER — Ambulatory Visit (HOSPITAL_BASED_OUTPATIENT_CLINIC_OR_DEPARTMENT_OTHER)
Admission: RE | Admit: 2022-03-29 | Discharge: 2022-03-29 | Disposition: A | Discharge status: Home / Self Care | Payer: Medicare HMO | Source: Ambulatory Visit | Attending: Internal Medicine | Admitting: Internal Medicine

## 2022-03-29 DIAGNOSIS — Z7901 Long term (current) use of anticoagulants: Secondary | ICD-10-CM | POA: Diagnosis not present

## 2022-03-29 DIAGNOSIS — I82411 Acute embolism and thrombosis of right femoral vein: Secondary | ICD-10-CM

## 2022-03-29 DIAGNOSIS — R2241 Localized swelling, mass and lump, right lower limb: Secondary | ICD-10-CM

## 2022-03-29 DIAGNOSIS — I82401 Acute embolism and thrombosis of unspecified deep veins of right lower extremity: Secondary | ICD-10-CM | POA: Insufficient documentation

## 2022-03-29 DIAGNOSIS — M79661 Pain in right lower leg: Secondary | ICD-10-CM | POA: Diagnosis present

## 2022-03-29 DIAGNOSIS — M7989 Other specified soft tissue disorders: Secondary | ICD-10-CM | POA: Diagnosis not present

## 2022-03-29 LAB — CBC WITH DIFFERENTIAL/PLATELET
Abs Immature Granulocytes: 0.03 10*3/uL (ref 0.00–0.07)
Basophils Absolute: 0 10*3/uL (ref 0.0–0.1)
Basophils Relative: 0 %
Eosinophils Absolute: 0.1 10*3/uL (ref 0.0–0.5)
Eosinophils Relative: 2 %
HCT: 40.7 % (ref 39.0–52.0)
Hemoglobin: 13.3 g/dL (ref 13.0–17.0)
Immature Granulocytes: 0 %
Lymphocytes Relative: 13 %
Lymphs Abs: 0.9 10*3/uL (ref 0.7–4.0)
MCH: 30.4 pg (ref 26.0–34.0)
MCHC: 32.7 g/dL (ref 30.0–36.0)
MCV: 92.9 fL (ref 80.0–100.0)
Monocytes Absolute: 0.7 10*3/uL (ref 0.1–1.0)
Monocytes Relative: 11 %
Neutro Abs: 5 10*3/uL (ref 1.7–7.7)
Neutrophils Relative %: 74 %
Platelets: 211 10*3/uL (ref 150–400)
RBC: 4.38 MIL/uL (ref 4.22–5.81)
RDW: 12.6 % (ref 11.5–15.5)
WBC: 6.7 10*3/uL (ref 4.0–10.5)
nRBC: 0 % (ref 0.0–0.2)

## 2022-03-29 LAB — PROTIME-INR
INR: 1 (ref 0.8–1.2)
Prothrombin Time: 12.9 seconds (ref 11.4–15.2)

## 2022-03-29 LAB — BASIC METABOLIC PANEL
Anion gap: 9 (ref 5–15)
BUN: 6 mg/dL — ABNORMAL LOW (ref 8–23)
CO2: 30 mmol/L (ref 22–32)
Calcium: 8.7 mg/dL — ABNORMAL LOW (ref 8.9–10.3)
Chloride: 98 mmol/L (ref 98–111)
Creatinine, Ser: 0.8 mg/dL (ref 0.61–1.24)
GFR, Estimated: >60 mL/min (ref >60)
Glucose, Bld: 98 mg/dL (ref 70–99)
Potassium: 4.9 mmol/L (ref 3.5–5.1)
Sodium: 137 mmol/L (ref 135–145)

## 2022-03-29 LAB — APTT: aPTT: 30 seconds (ref 24–36)

## 2022-03-29 MED ORDER — RIVAROXABAN 15 MG PO TABS
15.0000 mg | ORAL_TABLET | Freq: Two times a day (BID) | ORAL | Status: DC
  Administered 2022-03-29: 15 mg via ORAL
  Filled 2022-03-29: qty 1

## 2022-03-29 MED ORDER — RIVAROXABAN (XARELTO) VTE STARTER PACK (15 & 20 MG)
ORAL_TABLET | ORAL | 0 refills | Status: DC
  Filled 2022-03-29: qty 51, 30d supply, fill #0

## 2022-03-29 MED ORDER — RIVAROXABAN 10 MG PO TABS: 20.0000 mg | ORAL_TABLET | Freq: Every day | ORAL | Status: DC

## 2022-03-29 MED ORDER — RIVAROXABAN (XARELTO) VTE STARTER PACK (15 & 20 MG): ORAL_TABLET | ORAL | 0 refills | Status: DC

## 2022-03-29 NOTE — Telephone Encounter (Addendum)
Pt appt has been schedule for 03/29/2022. Pt notified and verbalize understanding. ? ?03/29/2022.  ?Per Dr.Lamptey-Pt has been instructed to go to the ED for further evaluation of blood clot.  ?

## 2022-03-29 NOTE — Telephone Encounter (Signed)
Order re-entered for the ambulatory setting ?

## 2022-03-29 NOTE — ED Provider Notes (Signed)
?[place] ?Provider Note ? ? ?CSN: [id number] ?Arrival date & time: 03/29/22  1122 ? ?  ? ?History ? ?Chief Complaint  ?Patient presents with  ? Blood clot  ? ? ?Daniel Sullivan is a 71 y.o. male with history significant for hyperlipidemia, hypertension who presents to the ED for evaluation of a diagnosed blood clot within the right leg.  Patient visited urgent care yesterday for evaluation of right lower extremity pain, redness and swelling for approximately 2 to 3 days.  He was scheduled for venous ultrasound earlier this morning which was positive for clot within the right leg and he was referred to the ED for treatment.  Patient notes that he suffered a few rib fractures several weeks ago but denies recent surgery or other trauma.  No previous history of blood clot.  Patient denies chest pain, cough, shortness of breath. ? ?HPI ? ?  ? ?Home Medications ?Prior to Admission medications   ?Medication Sig Start Date End Date Taking? Authorizing Provider  ?amLODipine (NORVASC) 5 MG tablet TAKE 1 TABLET BY MOUTH EVERY DAY ?Patient taking differently: Take 5 mg by mouth daily. 11/10/21  Yes Burchette, Alinda Sierras, MD  ?Glucosamine-Chondroit-Vit C-Mn TABS Take 1 tablet by mouth daily.   Yes [provider]  ?losartan (COZAAR) 50 MG tablet TAKE 1 TABLET BY MOUTH EVERY DAY ?Patient taking differently: Take 50 mg by mouth daily. 08/03/21  Yes Burchette, Alinda Sierras, MD  ?Multiple Vitamins-Minerals (MENS MULTI VITAMIN & MINERAL PO) Take 1 tablet by mouth daily.   Yes [provider]  ?omeprazole (PRILOSEC) 20 MG capsule TAKE 1 CAPSULE BY MOUTH EVERY DAY ?Patient taking differently: Take 20 mg by mouth daily. 12/10/21  Yes Burchette, Alinda Sierras, MD  ?pravastatin (PRAVACHOL) 40 MG tablet TAKE 1 TABLET BY MOUTH EVERY DAY ?Patient taking differently: Take 40 mg by mouth daily. 02/08/22  Yes Burchette, Alinda Sierras, MD  ?RIVAROXABAN Al[employer]a [employer]) VTE STARTER PACK (15 & 20 MG) Follow package  directions: Take one '15mg'$  tablet by mouth twice a day. On day 22, switch to one '20mg'$  tablet once a day. Take with food. 03/29/22  Yes Kathe Becton R, PA-C  ?sildenafil (VIAGRA) 100 MG tablet TAKE 0.5-1 TABLETS BY MOUTH DAILY AS NEEDED FOR ERECTILE DYSFUNCTION. ?Patient taking differently: Take 50-100 mg by mouth daily as needed for erectile dysfunction. 03/22/22  Yes Burchette, Alinda Sierras, MD  ?vitamin C (ASCORBIC ACID) 500 MG tablet Take 500 mg by mouth daily.   Yes [provider]  ?lidocaine (LIDODERM) 5 % Place 1 patch onto the skin daily. Remove & Discard patch within 12 hours or as directed by MD ?Patient not taking: Reported on 03/29/2022 02/16/22   Eulas Post, MD  ?   ? ?Allergies    ?Patient has no known allergies.   ? ?Review of Systems   ?Review of Systems ? ?Physical Exam ?Updated Vital Signs ?BP 139/85 (BP Location: Left Arm)   Pulse 93   Temp 97.8 ?F (36.6 ?C) (Oral)   Resp 16   SpO2 96%  ?Physical Exam ?Vitals and nursing note reviewed.  ?Constitutional:   ?   General: He is not in acute distress. ?   Appearance: He is not ill-appearing.  ?HENT:  ?   Head: Atraumatic.  ?Eyes:  ?   Conjunctiva/sclera: Conjunctivae normal.  ?Cardiovascular:  ?   Rate and Rhythm: Normal rate and regular rhythm.  ?   Pulses: Normal pulses.  ?   Heart sounds:  No murmur heard. ?Pulmonary:  ?   Effort: Pulmonary effort is normal. No respiratory distress.  ?   Breath sounds: Normal breath sounds.  ?Abdominal:  ?   General: Abdomen is flat. There is no distension.  ?   Palpations: Abdomen is soft.  ?   Tenderness: There is no abdominal tenderness.  ?Musculoskeletal:     ?   General: Normal range of motion.  ?   Cervical back: Normal range of motion.  ?   Right lower leg: Swelling and tenderness present.  ?   Left lower leg: Tenderness present. No swelling.  ?   Comments: Mild RLE swelling with posterior calf TTP. Erythema of the lateral and anterior shin stemming from small laceration to lateral RLE. No  evidence of infection. 2+ DP pulses  ?Skin: ?   General: Skin is warm and dry.  ?   Capillary Refill: Capillary refill takes less than 2 seconds.  ?Neurological:  ?   General: No focal deficit present.  ?   Mental Status: He is alert.  ?Psychiatric:     ?   Mood and Affect: Mood normal.  ? ? ?ED Results / Procedures / Treatments   ?Labs ?(all labs ordered are listed, but only abnormal results are displayed) ?Labs Reviewed  ?CBC WITH DIFFERENTIAL/PLATELET  ?APTT  ?PROTIME-INR  ?BASIC METABOLIC PANEL  ? ? ?EKG ?None ? ?Radiology ?LE VENOUS ? ?Result Date: 03/29/2022 ? Lower Venous DVT Study Patient Name:  Daniel Sullivan  Date of Exam:   03/29/2022 Medical Rec #: [id number]       Accession #:    [id number] Date of Birth: 03-Nov-1951        Patient Gender: M Patient Age:   37 years Exam Location:  [facility] Procedure:      VAS [place] LOWER EXTREMITY VENOUS (DVT) Referring Phys: PHILIP LAMPTEY --------------------------------------------------------------------------------  Indications: Pain.  Comparison Study: no prior Performing Technologist: Archie Patten RVS  Examination Guidelines: A complete evaluation includes B-mode imaging, spectral Doppler, color Doppler, and power Doppler as needed of all accessible portions of each vessel. Bilateral testing is considered an integral part of a complete examination. Limited examinations for reoccurring indications may be performed as noted. The reflux portion of the exam is performed with the patient in reverse Trendelenburg.  +---------+---------------+---------+-----------+----------+-----------------+ RIGHT    CompressibilityPhasicitySpontaneityPropertiesThrombus Aging    +---------+---------------+---------+-----------+----------+-----------------+ CFV      Partial        Yes      Yes                  Age Indeterminate +---------+---------------+---------+-----------+----------+-----------------+ SFJ      None                                          Age Indeterminate +---------+---------------+---------+-----------+----------+-----------------+ FV Prox  None                                         Age Indeterminate +---------+---------------+---------+-----------+----------+-----------------+ FV Mid   None                                         Age Indeterminate +---------+---------------+---------+-----------+----------+-----------------+ FV DistalNone  Age Indeterminate +---------+---------------+---------+-----------+----------+-----------------+ PFV      None                                         Age Indeterminate +---------+---------------+---------+-----------+----------+-----------------+ POP      None           No       No                   Age Indeterminate +---------+---------------+---------+-----------+----------+-----------------+ PTV      None                                         Age Indeterminate +---------+---------------+---------+-----------+----------+-----------------+ PERO     None                                         Age Indeterminate +---------+---------------+---------+-----------+----------+-----------------+ Gastroc  None                                         Age Indeterminate +---------+---------------+---------+-----------+----------+-----------------+ EIV                     Yes      Yes                                    +---------+---------------+---------+-----------+----------+-----------------+  +----+---------------+---------+-----------+----------+--------------+ LEFTCompressibilityPhasicitySpontaneityPropertiesThrombus Aging +----+---------------+---------+-----------+----------+--------------+ CFV Full           Yes      Yes                                 +----+---------------+---------+-----------+----------+--------------+    Summary: RIGHT: - Findings consistent with age indeterminate deep  vein thrombosis involving the right common femoral vein, right femoral vein, right proximal profunda vein, right popliteal vein, right posterior tibial veins, right peroneal veins, and right gastrocnemius  veins.  LEFT: - No evidence of common femoral vein obstruction.  *See table(s) above for measurements and observatio

## 2022-03-29 NOTE — ED Triage Notes (Signed)
Patient from [facility], states Thursday woke up with pain in right calf, was seen at [facility] this am and confirmed blood clot. VSS. NAD. ?

## 2022-03-29 NOTE — Discharge Instructions (Signed)
You were diagnosed today with a blood clot in your right lower leg extending from the back of your calf all the way up into your groin area.  The blood thinner we have prescribed for you today will help get rid of this blood clot to prevent further complications.  If at any point you develop chest pain, shortness of breath or cough, please return to the ED for evaluation.  Additionally if you suffer injuries while on blood clots including head injuries, you are at increased risk of bleeding and may want to consider coming into the ED for evaluation as well.  You are given the first month of blood thinner here in the emergency department, however you will need to follow-up with your primary care doctor within the month in order to obtain refills.  You will probably need to be on this medication for about 3 to 6 months. ?

## 2022-03-29 NOTE — Progress Notes (Signed)
Lower extremity venous duplex has been completed.  ? ?Preliminary results in CV Proc.  ? ?Daniel Sullivan ?03/29/2022 11:16 AM    ?

## 2022-04-06 ENCOUNTER — Encounter: Payer: Self-pay | Admitting: Family Medicine

## 2022-04-09 ENCOUNTER — Encounter: Payer: Self-pay | Admitting: Family Medicine

## 2022-04-09 ENCOUNTER — Ambulatory Visit (INDEPENDENT_AMBULATORY_CARE_PROVIDER_SITE_OTHER): Payer: Medicare HMO | Admitting: Family Medicine

## 2022-04-09 VITALS — BP 122/80 | HR 88 | Temp 97.7°F | Ht 69.5 in | Wt 160.3 lb

## 2022-04-09 DIAGNOSIS — I82411 Acute embolism and thrombosis of right femoral vein: Secondary | ICD-10-CM | POA: Insufficient documentation

## 2022-04-09 DIAGNOSIS — R634 Abnormal weight loss: Secondary | ICD-10-CM

## 2022-04-09 MED ORDER — RIVAROXABAN 20 MG PO TABS: 20.0000 mg | ORAL_TABLET | Freq: Every day | ORAL | 1 refills | Status: DC

## 2022-04-09 NOTE — Patient Instructions (Signed)
Monitor home weight and be in touch if any further loss in next few weeks.   ?

## 2022-04-09 NOTE — Progress Notes (Signed)
? ?Established Patient Office Visit ? ?Subjective   ?Patient ID: Daniel Sullivan, male    DOB: [birth year]  Age: 71 y.o. MRN: [id number] ? ?Chief Complaint  ?Patient presents with  ? Follow-up  ? ? ?HPI ? ? ?Patient seen with recently diagnosed DVT right lower extremity.  No prior history of DVT or PE.  He states that he woke up April 20 with some swelling and right calf pain.  He went to urgent care on the 23rd and Doppler was ordered which confirmed right lower extremity DVT extending into the thigh.  He was seen in the ER on the 24th and started on Xarelto.  No signs or symptoms of PE.  Is never any chest pains or dyspnea.  No known family history of coagulopathy. ? ?He does not take any hormone replacement.  Has taken testosterone in the past.  No recent leg injury.  Did have recent rib fractures and was somewhat more sedentary during that time but no prolonged travel.  Colonoscopy 2021.  Does have elevated PSA and has had previous MRI prostate 2020 along with negative biopsies recently.  Still followed by urology. ? ?His weight is down somewhat.  In looking back he had weight 172 pounds back in August and 167 pounds December 166 pounds in March and current weight 160 pounds.  He attributes some of this weight loss to having several dental implants with surgery starting back in August.  He states his current appetite is extremely good.  Denies any abdominal pain.  No headaches.  No cough.  No dyspnea.  He is anxious to start cycling more again. ? ?Past Medical History:  ?Diagnosis Date  ? Allergy   ? Arthritis   ? Asthma   ? Cancer [facility])   ? 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 Diverticulosis   ? Elevated PSA   ? GERD (gastroesophageal reflux disease)   ? Hemorrhoids   ? Hx of adenomatous colonic polyps   ? Hyperlipidemia   ? Hypertension   ? Hypogonadism in male   ? ?Past Surgical History:  ?Procedure Laterality Date  ? ANAL RECTAL MANOMETRY N/A 09/07/2017  ? Procedure: ANO RECTAL MANOMETRY;  Surgeon: Mauri Pole, MD;  Location: [facility];  Service: Endoscopy;  Laterality: N/A;  ? COLONOSCOPY W/ BIOPSIES    ? HEMORRHOID BANDING    ? ? reports that he quit smoking about 34 years ago. His smoking use included cigarettes. He has a 7.50 pack-year smoking history. He has never used smokeless tobacco. He reports current alcohol use. He reports that he does not use drugs. ?family history includes Alcohol abuse in his father; Melanoma (age of onset: 90) in his brother; Suicidality in his father. ?No Known Allergies ? ?Review of Systems  ?Constitutional:  Positive for weight loss. Negative for chills and fever.  ?Respiratory:  Negative for cough and shortness of breath.   ?Cardiovascular:  Negative for chest pain.  ?Gastrointestinal:  Negative for abdominal pain.  ? ?  ?Objective:  ?  ? ?BP 122/80 (BP Location: Left Arm, Patient Position: Sitting, Cuff Size: Normal)   Pulse 88   Temp 97.7 ?F (36.5 ?C) (Oral)   Ht 5' 9.5" (1.765 m)   Wt 160 lb 4.8 oz (72.7 kg)   SpO2 98%   BMI 23.33 kg/m?  ?Wt Readings from Last 3 Encounters:  ?04/09/22 160 lb 4.8 oz (72.7 kg)  ?02/16/22 166 lb 6.4 oz (75.5 kg)  ?02/02/22 165 lb (74.8 kg)  ? ?  ? ?  Physical Exam ?Vitals reviewed.  ?Cardiovascular:  ?   Rate and Rhythm: Normal rate and regular rhythm.  ?Pulmonary:  ?   Effort: Pulmonary effort is normal.  ?   Breath sounds: Normal breath sounds. No wheezing or rales.  ?Musculoskeletal:  ?   Comments: He has some obvious swelling right lower extremity compared to left.  No calf tenderness.  Edema is relatively mild.  He has a couple superficial abrasions right anterior shin region.  No signs of secondary infection.  ?Neurological:  ?   Mental Status: He is alert.  ? ? ? ?No results found for any visits on 04/09/22. ? ?Last CBC ?Lab Results  ?Component Value Date  ? WBC 6.7 03/29/2022  ? HGB 13.3 03/29/2022  ? HCT 40.7 03/29/2022  ? MCV 92.9 03/29/2022  ? MCH 30.4 03/29/2022  ? RDW 12.6 03/29/2022  ? PLT 211 03/29/2022  ? ?Last  metabolic panel ?Lab Results  ?Component Value Date  ? GLUCOSE 98 03/29/2022  ? NA 137 03/29/2022  ? K 4.9 03/29/2022  ? CL 98 03/29/2022  ? CO2 30 03/29/2022  ? BUN 6 (L) 03/29/2022  ? CREATININE 0.80 03/29/2022  ? GFRNONAA >60 03/29/2022  ? CALCIUM 8.7 (L) 03/29/2022  ? PROT 7.1 07/07/2021  ? ALBUMIN 4.1 07/07/2021  ? BILITOT 0.8 07/07/2021  ? ALKPHOS 48 07/07/2021  ? AST 24 07/07/2021  ? ALT 13 07/07/2021  ? ANIONGAP 9 03/29/2022  ? ?Last thyroid functions ?Lab Results  ?Component Value Date  ? TSH 2.95 07/07/2021  ? ?  ? ?The 10-year ASCVD risk score (Arnett DK, et al., 2019) is: 22.6% ? ?  ?Assessment & Plan:  ? ?#1 DVT right lower extremity.  This is a first event.  Somewhat less active recently following rib fractures but otherwise no clear provoking factors.  No prolonged travel.  No known family history.  No current hormone use. ? ?-He will transition from Xarelto 15 mg twice daily to Xarelto 20 mg once daily after 21 days.  New prescription provided for Xarelto 20 mg once daily ?-Recommend minimum of 3 to 4 months of therapy. ?-Consider hematology consult after this.  Is up to get their input regarding duration of therapy. ?-Even though he had recent rib fracture, he stays generally fairly active and this looks more like an unprovoked type DVT.  We did discuss the fact that there tends to be high recurrence rate for unprovoked DVT.  We also discussed potential triggers. ? ?#2 weight loss.  Patient currently relates appetite is excellent.  He attributes some of this to several dental implants.  In view of #1 above need to be careful about malignancy.  He has a longstanding history of elevated PSA but prostate biopsy has been negative.  Colonoscopy up-to-date.  No abdominal pain or other symptoms. ?-Recommend he monitor weight closely at home and be in touch if he is not gaining over the next several weeks and especially if he is losing any ? ?Return in about 3 months (around 07/10/2022).  ? ? ?Carolann Littler, MD ? ?

## 2022-04-12 ENCOUNTER — Other Ambulatory Visit (HOSPITAL_COMMUNITY): Payer: Self-pay

## 2022-04-12 ENCOUNTER — Telehealth (HOSPITAL_BASED_OUTPATIENT_CLINIC_OR_DEPARTMENT_OTHER): Payer: Self-pay

## 2022-04-12 NOTE — Telephone Encounter (Signed)
Pharmacy Transitions of Care Follow-up Telephone Call ? ?Date of discharge: 03/29/2022 ?Discharge Diagnosis: DVT of RLE ? ?How have you been since you were released from the hospital? Patient is doing well. Still experiencing some swelling but pain has improved and patient endorses he is continuing to get better. Has followed up with PCP and feels confident with care he is receiving. ? ?Medication changes made at discharge: ? - START: rivaroxaban ? ?Medication changes verified by the patient? Yes ?  ? ?Medication Accessibility: ? ?[facility] ? ?Was the patient provided with refills on discharged medications? No, new prescription for maintenance of rivaroxaban [facility] to [facility] by PCP.  ? ?  ?Daniel Sullivan, PharmD Candidate ?[facility] ? ?

## 2022-04-13 ENCOUNTER — Other Ambulatory Visit: Payer: Self-pay | Admitting: Family Medicine

## 2022-04-22 ENCOUNTER — Other Ambulatory Visit: Payer: Self-pay | Admitting: Family Medicine

## 2022-04-22 ENCOUNTER — Telehealth: Payer: Self-pay | Admitting: Family Medicine

## 2022-04-22 MED ORDER — RIVAROXABAN 20 MG PO TABS: 20.0000 mg | ORAL_TABLET | Freq: Every day | ORAL | 0 refills | Status: DC

## 2022-04-22 MED ORDER — OMEPRAZOLE 20 MG PO CPDR: DELAYED_RELEASE_CAPSULE | ORAL | 0 refills | Status: DC

## 2022-04-22 NOTE — Telephone Encounter (Signed)
I spoke with the pt he confirmed he needs temporary supply of Xarelto and Omeprazole sent to pharmacy in [facility] as he left medication at home. Pt confirmed he is taking Xarelto 20 mg and both rx has been sent to the pt's requested pharmacy.

## 2022-04-22 NOTE — Telephone Encounter (Signed)
Pt call and stated he need a 4 day supply of his blood thinner medication and 4 day of the medication for his heart burn.pt stated he want it [facility] to [facility] [place] and also pt want a call back  at [phone] when it is sent.

## 2022-04-27 ENCOUNTER — Other Ambulatory Visit: Payer: Medicare HMO

## 2022-04-28 DIAGNOSIS — L814 Other melanin hyperpigmentation: Secondary | ICD-10-CM | POA: Diagnosis not present

## 2022-04-28 DIAGNOSIS — L570 Actinic keratosis: Secondary | ICD-10-CM | POA: Diagnosis not present

## 2022-04-29 ENCOUNTER — Other Ambulatory Visit: Payer: Medicare HMO

## 2022-04-29 ENCOUNTER — Encounter: Payer: Self-pay | Admitting: Family Medicine

## 2022-04-29 DIAGNOSIS — R972 Elevated prostate specific antigen [PSA]: Secondary | ICD-10-CM

## 2022-04-30 LAB — PSA: Prostate Specific Ag, Serum: 7.4 ng/mL — ABNORMAL HIGH (ref 0.0–4.0)

## 2022-05-04 ENCOUNTER — Ambulatory Visit: Payer: Medicare HMO | Admitting: Urology

## 2022-05-11 ENCOUNTER — Encounter: Payer: Self-pay | Admitting: Urology

## 2022-05-12 ENCOUNTER — Encounter: Payer: Self-pay | Admitting: Family Medicine

## 2022-05-12 ENCOUNTER — Telehealth: Payer: Self-pay | Admitting: Hematology and Oncology

## 2022-05-12 ENCOUNTER — Ambulatory Visit (INDEPENDENT_AMBULATORY_CARE_PROVIDER_SITE_OTHER): Payer: Medicare HMO | Admitting: Family Medicine

## 2022-05-12 VITALS — BP 136/80 | HR 100 | Temp 97.9°F | Ht 69.5 in | Wt 158.4 lb

## 2022-05-12 DIAGNOSIS — R634 Abnormal weight loss: Secondary | ICD-10-CM

## 2022-05-12 DIAGNOSIS — R42 Dizziness and giddiness: Secondary | ICD-10-CM

## 2022-05-12 DIAGNOSIS — I82411 Acute embolism and thrombosis of right femoral vein: Secondary | ICD-10-CM | POA: Diagnosis not present

## 2022-05-12 DIAGNOSIS — R0609 Other forms of dyspnea: Secondary | ICD-10-CM | POA: Diagnosis not present

## 2022-05-12 MED ORDER — PRAMIPEXOLE DIHYDROCHLORIDE 0.25 MG PO TABS: 0.2500 mg | ORAL_TABLET | Freq: Every day | ORAL | 3 refills | Status: DC

## 2022-05-12 NOTE — Progress Notes (Signed)
Established Patient Office Visit  Subjective   Patient ID: Daniel Sullivan, male    DOB: 11/07/1951  Age: 71 y.o. MRN: [id number]  Chief Complaint  Patient presents with   Weight Loss   Shortness of Breath    Patient complains of shortness of breath upon exertion, x2 weeks   Leg Swelling    Patient complains of right swollen leg    HPI   Past medical history significant for hypertension, recent acute DVT right lower extremity, GERD, hyperlipidemia, elevated PSA followed by urology with no history of known prostate cancer.  Seen today with some mild ongoing weight loss and recent increased dyspnea with exertion.  Stays very active.  Has had dizziness with standing occasionally thinks this may be due to dehydration.  Usually improves with fluids.  Lost about 17 pounds since last August.  Initially thought this may be due to some new dental implants.  He had a fall with rib fracture few months ago.  Chest x-ray then showed no acute abnormalities.  Weight down another pound and a half from May.  Diagnosed in April with acute DVT right lower extremity.  This was initial event.  Currently on Xarelto 20 mg daily.  No chest pains.  No pleuritic pain.  Dyspnea only occurs with exertion.  No left lower extremity swelling.  Has had some chronic edema right lower extremity since the DVT.  No known family history of DVT or PE.  He states his appetite is good.  He remains active and is [place] several days per week.  He has a place at [facility] and plans to go back there later today.  Rib pain from previous fracture has improved.  Denies any other bone pain.  He was substantially less active following rib injury last winter-but picking back up activity at this time.  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 His smoking use included cigarettes. He has a 7.50 pack-year smoking history. He has never used smokeless tobacco. He reports current alcohol use. He reports that he does not use drugs. family history includes Alcohol abuse in his father; Melanoma (age of onset: 65) in his brother; Suicidality in his father. No Known Allergies  Review of Systems  Constitutional:  Positive for weight loss. Negative for chills and fever.  Respiratory:  Positive for shortness of breath. Negative for cough and wheezing.   Cardiovascular:  Positive for leg swelling. Negative for chest pain.  Gastrointestinal:  Negative for abdominal pain.  Genitourinary:  Negative for dysuria.  Neurological:  Positive for dizziness. Negative for focal weakness.  Psychiatric/Behavioral:  Negative for depression.      Objective:     BP 136/80 (BP Location:  Left Arm, Patient Position: Sitting, Cuff Size: Normal)   Pulse 100   Temp 97.9 F (36.6 C) (Oral)   Ht 5' 9.5" (1.765 m)   Wt 158 lb 6.4 oz (71.8 kg)   SpO2 98%   BMI 23.06 kg/m  BP Readings from Last 3 Encounters:  05/12/22 136/80  04/09/22 122/80  03/29/22 (!) 148/92   Wt Readings from Last 3 Encounters:  05/12/22 158 lb 6.4 oz (71.8 kg)  04/09/22 160 lb 4.8 oz (72.7 kg)  02/16/22 166 lb 6.4 oz (75.5 kg)      Physical Exam Vitals reviewed.  Constitutional:      Appearance: He is well-developed.  Neck:     Thyroid: No thyromegaly.  Cardiovascular:     Rate and Rhythm: Normal rate and regular rhythm.     Heart  sounds: No murmur heard.   No gallop.  Pulmonary:     Effort: Pulmonary effort is normal.     Breath sounds: Normal breath sounds.  Musculoskeletal:     Cervical back: Neck supple.     Right lower leg: Edema present.     Comments: He has some edema right lower extremity compared to the left related to recent acute DVT.  Lymphadenopathy:     Cervical: No cervical adenopathy.  Neurological:     General: No focal deficit present.     Mental Status: He is alert.     No results found for any visits on 05/12/22.  Last CBC Lab Results  Component Value Date   WBC 6.7 03/29/2022   HGB 13.3 03/29/2022   HCT 40.7 03/29/2022   MCV 92.9 03/29/2022   MCH 30.4 03/29/2022   RDW 12.6 03/29/2022   PLT 211 63/78/5885   Last metabolic panel Lab Results  Component Value Date   GLUCOSE 98 03/29/2022   NA 137 03/29/2022   K 4.9 03/29/2022   CL 98 03/29/2022   CO2 30 03/29/2022   BUN 6 (L) 03/29/2022   CREATININE 0.80 03/29/2022   GFRNONAA >60 03/29/2022   CALCIUM 8.7 (L) 03/29/2022   PROT 7.1 07/07/2021   ALBUMIN 4.1 07/07/2021   BILITOT 0.8 07/07/2021   ALKPHOS 48 07/07/2021   AST 24 07/07/2021   ALT 13 07/07/2021   ANIONGAP 9 03/29/2022   Last thyroid functions Lab Results  Component Value Date   TSH 2.95 07/07/2021      The 10-year ASCVD risk score (Arnett DK, et al., 2019) is: 26.8%    Assessment & Plan:   #1 history of recent acute right DVT.  This was essentially unprovoked.  He did have recent rib fracture and was less active than usual but not totally sedentary.  No prior history of DVT or PE.  No known family history.  Patient currently on Xarelto and recommend minimum of 3 to 4 months therapy. -We did discuss possible hematology referral to get their guidance and opinion regarding duration of Xarelto therapy.  #2 weight loss.  Minimal weight loss since May.  He is down about 14 pounds total since last August.  He relates excellent appetite.  Generally feels well  except for some occasional lightheadedness and recent dyspnea with exertion. -Check sed rate, CBC, CMP, TSH -Continue to monitor closely -We did discuss our concerns in the setting of recent DVT to rule out malignancy.  Colonoscopy up-to-date.  History of elevated PSA as below-followed by urology..  #3 dyspnea with exertion.  He thinks this may be related to deconditioning from recent rib fracture  and this certainly seems possible.  EKG today shows sinus rhythm with no acute changes.  Recent chest x-ray unremarkable.  We did discuss possible other etiologies such as pulmonary emboli with recent DVT but he has consistently been taking his Xarelto and has no hypoxemia, chest pain, or other predictors of likely PE. We discussed him gradually increasing his physical activity as tolerated.  Follow-up immediately for any chest pain or progressive dyspnea  #4 elevated PSA.  Followed by urology.  Previous evaluations negative for cancer.   No follow-ups on file.    Carolann Littler, MD

## 2022-05-12 NOTE — Telephone Encounter (Signed)
Scheduled appt per 6/7 referral. Pt is aware of appt date and time. Pt is aware to arrive 15 mins prior to appt time and to bring and updated insurance card. Pt is aware of appt location.

## 2022-05-18 ENCOUNTER — Encounter: Payer: Self-pay | Admitting: Urology

## 2022-05-18 ENCOUNTER — Ambulatory Visit: Payer: Medicare HMO | Admitting: Urology

## 2022-05-18 VITALS — BP 159/79 | HR 89 | Ht 69.5 in | Wt 160.0 lb

## 2022-05-18 DIAGNOSIS — R972 Elevated prostate specific antigen [PSA]: Secondary | ICD-10-CM

## 2022-05-18 DIAGNOSIS — Z8546 Personal history of malignant neoplasm of prostate: Secondary | ICD-10-CM | POA: Diagnosis not present

## 2022-05-18 DIAGNOSIS — R3915 Urgency of urination: Secondary | ICD-10-CM | POA: Diagnosis not present

## 2022-05-18 DIAGNOSIS — R350 Frequency of micturition: Secondary | ICD-10-CM | POA: Diagnosis not present

## 2022-05-18 NOTE — Progress Notes (Signed)
05/18/22 12:08 PM   Daniel Sullivan 11/21/1951 [id number]  Referring provider:  Eulas Post, MD [address] [place],  [place] [address] Chief Complaint  Patient presents with   Elevated PSA     HPI: Daniel Sullivan is a 71 y.o.male with a personal history of with a personal history of prostate cancer and hypogonadism previously on Androgel  who presents today for 6 month follow-up with PSA.  S/p prostate MRI in 01/2019 which showed no lesions, PI-RADS 1 ; volume 3.7 x 3.2 x 5.1 cm   He is also s/p biopsy in 2014.  Presumably this was negative, done by Dr. Kellie Simmering.   He underwent prostate biopsy on 10/20/2021 which revealed Gleason 3+3=6 involving 1 core at the right apex affecting 14%. TRUS 56.6 cc.   His most recent PSA was 7.4 on 04/29/2022.   He is now on Xarelto and was diagnosed with PE.  Continues to have right greater than left lower extremity edema related to this.  He reports today that he has intermediate urinary urgency but he attributes this to fluid intake. He wakes up x2 nightly.  Overall he is not bothered by this.  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   Diverticulosis    Elevated PSA    GERD (gastroesophageal reflux disease)    Hemorrhoids    Hx of adenomatous colonic polyps    Hyperlipidemia    Hypertension    Hypogonadism in male     Surgical History: Past Surgical History:  Procedure Laterality Date   ANAL RECTAL MANOMETRY N/A 09/07/2017   Procedure: ANO RECTAL MANOMETRY;  Surgeon: Mauri Pole, MD;  Location: [facility];  Service: Endoscopy;   Laterality: N/A;   COLONOSCOPY W/ BIOPSIES     HEMORRHOID BANDING      Home Medications:  Allergies as of 05/18/2022   No Known Allergies      Medication List        Accurate as of May 18, 2022 12:08 PM. If you have any questions, ask your nurse or doctor.          STOP taking these medications    lidocaine 5 % Commonly known as: Lidoderm Stopped by: Hollice Espy, MD       TAKE these medications    amLODipine 5 MG tablet Commonly known as: NORVASC TAKE 1 TABLET BY MOUTH EVERY DAY   Glucosamine-Chondroit-Vit C-Mn Tabs Take 1 tablet by mouth daily.   losartan 50 MG tablet Commonly known as: COZAAR TAKE 1 TABLET BY MOUTH EVERY DAY   MENS MULTI VITAMIN & MINERAL PO Take 1 tablet by mouth daily.   omeprazole 20 MG capsule Commonly known as: PRILOSEC TAKE 1 CAPSULE BY MOUTH EVERY DAY   pramipexole 0.25 MG tablet Commonly known as: Mirapex Take 1 tablet (0.25 mg total) by mouth at bedtime.   pravastatin 40 MG tablet Commonly known as: PRAVACHOL TAKE 1 TABLET BY MOUTH EVERY DAY  rivaroxaban 20 MG Tabs tablet Commonly known as: XARELTO Take 1 tablet (20 mg total) by mouth daily with supper.   sildenafil 100 MG tablet Commonly known as: VIAGRA TAKE 0.5-1 TABLETS BY MOUTH DAILY AS NEEDED FOR ERECTILE DYSFUNCTION. What changed: See the new instructions.   vitamin C 500 MG tablet Commonly known as: ASCORBIC ACID Take 500 mg by mouth daily.        Allergies:  No Known Allergies  Family History: Family History  Problem Relation Age of Onset   Alcohol abuse Father    Suicidality Father    Melanoma Brother 22   Stomach cancer Neg Hx    Colon cancer Neg Hx    Rectal cancer Neg Hx    Esophageal cancer Neg Hx     Social History:  reports that he quit smoking about 34 years ago. His smoking use included cigarettes. He has a 7.50 pack-year smoking history. He has never used smokeless tobacco. He reports current alcohol use. He reports that he  does not use drugs.   Physical Exam: BP (!) 159/79   Pulse 89   Ht 5' 9.5" (1.765 m)   Wt 160 lb (72.6 kg)   BMI 23.29 kg/m   Constitutional:  Alert and oriented, No acute distress. HEENT: [place] AT, moist mucus membranes.  Trachea midline, no masses. Cardiovascular: No clubbing, cyanosis, or edema. Respiratory: Normal respiratory effort, no increased work of breathing. Skin: No rashes, bruises or suspicious lesions. Neurologic: Grossly intact, no focal deficits, moving all 4 extremities. Psychiatric: Normal mood and affect.  Laboratory Data:  Lab Results  Component Value Date   CREATININE 0.80 03/29/2022   Lab Results  Component Value Date   HGBA1C 5.9 (A) 02/29/2020    Assessment & Plan:    Prostate cancer  - Very low risk low volume disease  - On active surveillance. PSA has trended down - Will continue to monitor with PSA and DRE in 6 months   2. Urinary urgency/ frequency  - Occasional nocturia x2 with minimal bother   F/u  6 months with PSA/DRE  Conley Rolls as a scribe for Hollice Espy, MD.,have documented all relevant documentation on the behalf of Hollice Espy, MD,as directed by  Hollice Espy, MD while in the presence of Hollice Espy, MD.  I have reviewed the above documentation for accuracy and completeness, and I agree with the above.   Hollice Espy, MD   [facility] [address] [place], [place] [address] [phone]

## 2022-05-19 ENCOUNTER — Telehealth: Payer: Self-pay | Admitting: Family Medicine

## 2022-05-19 NOTE — Telephone Encounter (Signed)
Pt called to schedule an appt to address his concerns re: blood clots. Pt was asked if he'd like to speak to Triage and he declined. Pt stated he has leg swelling for weeks and does not want to see another provider. PT is aware Dr. Does not work on Thursday (05/20/22) Pt would like to be see as soon as possible with Dr. Elease Hashimoto. Please advise.

## 2022-05-19 NOTE — Telephone Encounter (Signed)
I spoke with the pt and advised him to be seen by another provider in absence of PCP. Pt declined and stated he would cal back to schedule an opening with PCP at next opening. Pt reported this is not an urgent matter and he just wants to talk with PCP about ongoing swelling that he has been experiencing.

## 2022-05-25 ENCOUNTER — Other Ambulatory Visit: Payer: Self-pay

## 2022-05-25 ENCOUNTER — Encounter: Payer: Self-pay | Admitting: Hematology and Oncology

## 2022-05-25 ENCOUNTER — Inpatient Hospital Stay: Payer: Medicare HMO | Attending: Hematology and Oncology | Admitting: Hematology and Oncology

## 2022-05-25 VITALS — BP 157/80 | HR 79 | Temp 97.5°F | Resp 18 | Ht 69.5 in | Wt 163.6 lb

## 2022-05-25 DIAGNOSIS — Z85828 Personal history of other malignant neoplasm of skin: Secondary | ICD-10-CM | POA: Insufficient documentation

## 2022-05-25 DIAGNOSIS — R600 Localized edema: Secondary | ICD-10-CM | POA: Diagnosis not present

## 2022-05-25 DIAGNOSIS — Z8719 Personal history of other diseases of the digestive system: Secondary | ICD-10-CM | POA: Insufficient documentation

## 2022-05-25 DIAGNOSIS — Z86010 Personal history of colonic polyps: Secondary | ICD-10-CM | POA: Diagnosis not present

## 2022-05-25 DIAGNOSIS — Z811 Family history of alcohol abuse and dependence: Secondary | ICD-10-CM | POA: Insufficient documentation

## 2022-05-25 DIAGNOSIS — I82411 Acute embolism and thrombosis of right femoral vein: Secondary | ICD-10-CM | POA: Diagnosis not present

## 2022-05-25 DIAGNOSIS — I10 Essential (primary) hypertension: Secondary | ICD-10-CM | POA: Insufficient documentation

## 2022-05-25 DIAGNOSIS — Z808 Family history of malignant neoplasm of other organs or systems: Secondary | ICD-10-CM | POA: Insufficient documentation

## 2022-05-25 DIAGNOSIS — R972 Elevated prostate specific antigen [PSA]: Secondary | ICD-10-CM | POA: Insufficient documentation

## 2022-05-25 DIAGNOSIS — Z818 Family history of other mental and behavioral disorders: Secondary | ICD-10-CM | POA: Insufficient documentation

## 2022-05-25 DIAGNOSIS — Z8546 Personal history of malignant neoplasm of prostate: Secondary | ICD-10-CM | POA: Insufficient documentation

## 2022-05-25 DIAGNOSIS — Z7901 Long term (current) use of anticoagulants: Secondary | ICD-10-CM | POA: Diagnosis not present

## 2022-05-25 DIAGNOSIS — Z87891 Personal history of nicotine dependence: Secondary | ICD-10-CM | POA: Insufficient documentation

## 2022-05-25 DIAGNOSIS — Z79899 Other long term (current) drug therapy: Secondary | ICD-10-CM | POA: Diagnosis not present

## 2022-05-25 NOTE — Progress Notes (Signed)
[facility] NOTE  Patient Care Team: Eulas Post, MD as PCP - General  CHIEF COMPLAINTS/PURPOSE OF CONSULTATION:  Right lower extremity DVT, thought to be non-provoked  HISTORY OF PRESENTING ILLNESS:  Daniel Sullivan 71 y.o. male is here because of recent diagnosis of right lower extremity DVT The patient has background history of early stage prostate cancer on observation.  He had history of skin cancer.  The patient had used testosterone for several months but discontinued when he was diagnosed with acute DVT.   He had rib fracture several months ago and was immobilized because of pain. On 03/29/2022, he was found to have right lower extremity DVT.  He presented with pain and swelling Venous Doppler US showed RIGHT:  - Findings consistent with age indeterminate deep vein thrombosis involving the right common femoral vein, right femoral vein, right proximal profunda vein, right popliteal vein, right posterior tibial veins, right peroneal veins, and right gastrocnemius  veins.  He was placed on anticoagulation therapy with Xarelto.  His pain went away after a few weeks.  He has persistent right lower extremity edema Prior to diagnosis, he denies recent chest pain on exertion, shortness of breath on minimal exertion, pre-syncopal episodes, hemoptysis, or palpitation.  He denies recent long distance travel, dehydration, recent surgery or smoking. He had history of remote smoking but quit in 1989 He had prior surgeries before and never had perioperative thromboembolic events. There is no family history of blood clots or cancer.  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   Diverticulosis    Elevated PSA    GERD (gastroesophageal reflux disease)    Hemorrhoids    Hx of adenomatous colonic polyps    Hyperlipidemia    Hypertension    Hypogonadism in male      SURGICAL HISTORY: Past Surgical History:  Procedure Laterality Date   ANAL RECTAL MANOMETRY N/A 09/07/2017   Procedure: ANO RECTAL MANOMETRY;  Surgeon: Mauri Pole, MD;  Location: [facility];  Service: Endoscopy;  Laterality: N/A;   COLONOSCOPY W/ BIOPSIES     HEMORRHOID BANDING      SOCIAL HISTORY: Social History   Socioeconomic History   Marital status: Widowed    Spouse name: Not on file   Number of children: Not on file   Years of education: Not on file   Highest education level: Bachelor's degree (e.g., BA, AB, BS)  Occupational History   Occupation: retired  Tobacco Use   Smoking status: Former    Packs/day: 0.50    Years: 15.00    Total pack years: 7.50    Types: Cigarettes    Quit date: 02/24/1988    Years since quitting: 34.2   Smokeless tobacco: Never  Vaping Use   Vaping Use: Never used  Substance and Sexual Activity   Alcohol use: Yes    Comment: occasionally   Drug use: No   Sexual activity: Yes    Partners: Female  Other Topics Concern   Not on file  Social History Narrative   Married, retired   Former smoker   + [place], no drugs   Social Determinants of Radio broadcast assistant Strain: [place]  (02/01/2022)   Overall Financial Resource Strain (CARDIA)    Difficulty of Paying Living Expenses: Not hard at all  Food Insecurity:  No Food Insecurity (02/01/2022)   Hunger Vital Sign    Worried About Running Out of Food in the Last Year: Never true    Ran Out of Food in the Last Year: Never true  Transportation Needs: No Transportation Needs (02/01/2022)   PRAPARE - Hydrologist (Medical): No    Lack of Transportation (Non-Medical): No  Physical Activity: Insufficiently Active (02/01/2022)   Exercise Vital Sign    Days of Exercise per Week: 3 days    Minutes of Exercise per Session: 40 min  Stress: No Stress Concern Present (02/01/2022)   [place]    Feeling of Stress : Only a little  Social Connections: Unknown (02/01/2022)   Social Connection and Isolation Panel [NHANES]    Frequency of Communication with Friends and Family: More than three times a week    Frequency of Social Gatherings with Friends and Family: Three times a week    Attends Religious Services: Patient refused    Active Member of Clubs or Organizations: No    Attends Music therapist: Not on file    Marital Status: Living with partner  Intimate Partner Violence: Not on file    FAMILY HISTORY: Family History  Problem Relation Age of Onset   Alcohol abuse Father    Suicidality Father    Melanoma Brother 109   Stomach cancer Neg Hx    Colon cancer Neg Hx    Rectal cancer Neg Hx    Esophageal cancer Neg Hx     ALLERGIES:  has No Known Allergies.  MEDICATIONS:  Current Outpatient Medications  Medication Sig Dispense Refill   amLODipine (NORVASC) 5 MG tablet TAKE 1 TABLET BY MOUTH EVERY DAY (Patient taking differently: Take 5 mg by mouth daily.) 90 tablet 1   Glucosamine-Chondroit-Vit C-Mn TABS Take 1 tablet by mouth daily.     losartan (COZAAR) 50 MG tablet TAKE 1 TABLET BY MOUTH EVERY DAY (Patient taking differently: Take 50 mg by mouth daily.) 90 tablet 3   Multiple Vitamins-Minerals (MENS MULTI VITAMIN & MINERAL PO) Take 1 tablet by mouth daily.     omeprazole (PRILOSEC) 20 MG capsule TAKE 1 CAPSULE BY MOUTH EVERY DAY 90 capsule 1   pramipexole (MIRAPEX) 0.25 MG tablet Take 1 tablet (0.25 mg total) by mouth at bedtime. 90 tablet 3   pravastatin (PRAVACHOL) 40 MG tablet TAKE 1 TABLET BY MOUTH EVERY DAY (Patient taking differently: Take 40 mg by mouth daily.) 90 tablet 0   rivaroxaban (XARELTO) 20 MG TABS tablet Take 1 tablet (20 mg total) by mouth daily with supper. 30 tablet 0   sildenafil (VIAGRA) 100 MG tablet TAKE 0.5-1 TABLETS BY MOUTH DAILY AS NEEDED FOR ERECTILE DYSFUNCTION. (Patient taking differently: Take 50-100 mg by  mouth daily as needed for erectile dysfunction.) 10 tablet 3   vitamin C (ASCORBIC ACID) 500 MG tablet Take 500 mg by mouth daily.     No current facility-administered medications for this visit.    REVIEW OF SYSTEMS:   Constitutional: Denies fevers, chills or abnormal night sweats Eyes: Denies blurriness of vision, double vision or watery eyes Ears, nose, mouth, throat, and face: Denies mucositis or sore throat Respiratory: Denies cough, dyspnea or wheezes Cardiovascular: Denies palpitation, chest discomfort  Gastrointestinal:  Denies nausea, heartburn or change in bowel habits Skin: Denies abnormal skin rashes Lymphatics: Denies new lymphadenopathy or easy bruising Neurological:Denies numbness, tingling or new weaknesses Behavioral/Psych: Mood is  stable, no new changes  All other systems were reviewed with the patient and are negative.  PHYSICAL EXAMINATION: ECOG PERFORMANCE STATUS: 0 - Asymptomatic  Vitals:   05/25/22 1321  BP: (!) 157/80  Pulse: 79  Resp: 18  Temp: (!) 97.5 F (36.4 C)  SpO2: 99%   Filed Weights   05/25/22 1321  Weight: 163 lb 9.6 oz (74.2 kg)    GENERAL:alert, no distress and comfortable SKIN: skin color, texture, turgor are normal, no rashes or significant lesions.  Noted skin bruises and solar keratosis EYES: normal, conjunctiva are pink and non-injected, sclera clear OROPHARYNX:no exudate, no erythema and lips, buccal mucosa, and tongue normal  NECK: supple, thyroid normal size, non-tender, without nodularity LYMPH:  no palpable lymphadenopathy in the cervical, axillary or inguinal LUNGS: clear to auscultation and percussion with normal breathing effort HEART: regular rate & rhythm and no murmurs with mild right lower extremity edema ABDOMEN:abdomen soft, non-tender and normal bowel sounds Musculoskeletal:no cyanosis of digits and no clubbing  PSYCH: alert & oriented x 3 with fluent speech NEURO: no focal motor/sensory deficits  LABORATORY  DATA:  I have reviewed the data as listed Last CBC Lab Results  Component Value Date   WBC 6.7 03/29/2022   HGB 13.3 03/29/2022   HCT 40.7 03/29/2022   MCV 92.9 03/29/2022   MCH 30.4 03/29/2022   RDW 12.6 03/29/2022   PLT 211 03/29/2022   ASSESSMENT PLAN:  Acute right lower extremity DVT Overall, I felt that the DVT is provoked in the setting of malignancy, trauma and immobilization with possible component of dehydration He is doing well on Xarelto I recommend the patient to complete treatment for 3 months I recommend the patient to increase oral fluid hydration He will continue follow-up with urology for his early stage prostate cancer After completion of Xarelto, I recommend the patient to switch to aspirin therapy for secondary prevention Finally, at the end of our consultation today, I reinforced the importance of preventive strategies such as avoiding hormonal supplement, avoiding cigarette smoking, keeping up-to-date with screening programs for early cancer detection, frequent ambulation for long distance travel and aggressive DVT prophylaxis in all surgical settings. I have not made a return appointment for the patient to come back. I would be happy to assist in perioperative DVT management in the future should he need any interruption of his anticoagulation therapy for elective procedures.   All questions were answered. The patient knows to call the clinic with any problems, questions or concerns. The total time spent in the appointment was 55 minutes encounter with patients including review of chart and various tests results, discussions about plan of care and coordination of care plan  Heath Lark, MD 6/20/20232:45 PM

## 2022-06-21 ENCOUNTER — Telehealth: Payer: Self-pay | Admitting: Family Medicine

## 2022-06-21 NOTE — Telephone Encounter (Signed)
Left message for patient to call back and schedule Medicare Annual Wellness Visit (AWV) either virtually or in office. Left  my Daniel Sullivan number [phone]   awvi 02/04/16 per [facility]  please schedule at anytime with [facility] Nurse Health Advisor 1 or 2   This should be a 45 minute visit.

## 2022-07-13 ENCOUNTER — Other Ambulatory Visit: Payer: Self-pay | Admitting: Family Medicine

## 2022-07-20 ENCOUNTER — Encounter: Payer: Self-pay | Admitting: Family Medicine

## 2022-07-20 NOTE — Telephone Encounter (Signed)
Noted  

## 2022-07-27 ENCOUNTER — Telehealth: Payer: Self-pay

## 2022-07-27 DIAGNOSIS — L821 Other seborrheic keratosis: Secondary | ICD-10-CM | POA: Diagnosis not present

## 2022-07-27 DIAGNOSIS — L570 Actinic keratosis: Secondary | ICD-10-CM | POA: Diagnosis not present

## 2022-07-27 DIAGNOSIS — D225 Melanocytic nevi of trunk: Secondary | ICD-10-CM | POA: Diagnosis not present

## 2022-07-27 DIAGNOSIS — L814 Other melanin hyperpigmentation: Secondary | ICD-10-CM | POA: Diagnosis not present

## 2022-07-27 NOTE — Telephone Encounter (Signed)
Contacted patient on preferred number listed in notes for scheduled AWV. Patient stated unable to complete visit today and would prefer to reschedule for an in office visit next time.

## 2022-07-28 ENCOUNTER — Ambulatory Visit (INDEPENDENT_AMBULATORY_CARE_PROVIDER_SITE_OTHER): Payer: Medicare HMO | Admitting: Family Medicine

## 2022-07-28 ENCOUNTER — Encounter: Payer: Self-pay | Admitting: Family Medicine

## 2022-07-28 VITALS — BP 142/80 | HR 80 | Temp 98.0°F | Ht 69.29 in | Wt 169.1 lb

## 2022-07-28 DIAGNOSIS — R972 Elevated prostate specific antigen [PSA]: Secondary | ICD-10-CM | POA: Diagnosis not present

## 2022-07-28 DIAGNOSIS — Z0000 Encounter for general adult medical examination without abnormal findings: Secondary | ICD-10-CM | POA: Diagnosis not present

## 2022-07-28 DIAGNOSIS — D649 Anemia, unspecified: Secondary | ICD-10-CM | POA: Diagnosis not present

## 2022-07-28 LAB — CBC WITH DIFFERENTIAL/PLATELET
Basophils Absolute: 0 10*3/uL (ref 0.0–0.1)
Basophils Relative: 0.6 % (ref 0.0–3.0)
Eosinophils Absolute: 0.3 10*3/uL (ref 0.0–0.7)
Eosinophils Relative: 4.4 % (ref 0.0–5.0)
HCT: 31 % — ABNORMAL LOW (ref 39.0–52.0)
Hemoglobin: 10 g/dL — ABNORMAL LOW (ref 13.0–17.0)
Lymphocytes Relative: 15.7 % (ref 12.0–46.0)
Lymphs Abs: 0.9 10*3/uL (ref 0.7–4.0)
MCHC: 32.2 g/dL (ref 30.0–36.0)
MCV: 86.8 fl (ref 78.0–100.0)
Monocytes Absolute: 0.7 10*3/uL (ref 0.1–1.0)
Monocytes Relative: 11.6 % (ref 3.0–12.0)
Neutro Abs: 3.9 10*3/uL (ref 1.4–7.7)
Neutrophils Relative %: 67.7 % (ref 43.0–77.0)
Platelets: 245 10*3/uL (ref 150.0–400.0)
RBC: 3.57 Mil/uL — ABNORMAL LOW (ref 4.22–5.81)
RDW: 14.8 % (ref 11.5–15.5)
WBC: 5.7 10*3/uL (ref 4.0–10.5)

## 2022-07-28 LAB — BASIC METABOLIC PANEL
BUN: 16 mg/dL (ref 6–23)
CO2: 29 mEq/L (ref 19–32)
Calcium: 8.6 mg/dL (ref 8.4–10.5)
Chloride: 102 mEq/L (ref 96–112)
Creatinine, Ser: 0.93 mg/dL (ref 0.40–1.50)
GFR: 82.06 mL/min (ref >60.00)
Glucose, Bld: 93 mg/dL (ref 70–99)
Potassium: 4.5 mEq/L (ref 3.5–5.1)
Sodium: 138 mEq/L (ref 135–145)

## 2022-07-28 LAB — LIPID PANEL
Cholesterol: 176 mg/dL (ref 0–200)
HDL: 72 mg/dL (ref >39.00)
LDL Cholesterol: 89 mg/dL (ref 0–99)
NonHDL: 103.86
Total CHOL/HDL Ratio: 2
Triglycerides: 75 mg/dL (ref 0.0–149.0)
VLDL: 15 mg/dL (ref 0.0–40.0)

## 2022-07-28 LAB — HEPATIC FUNCTION PANEL
ALT: 14 U/L (ref 0–53)
AST: 27 U/L (ref 0–37)
Albumin: 4.2 g/dL (ref 3.5–5.2)
Alkaline Phosphatase: 57 U/L (ref 39–117)
Bilirubin, Direct: 0.1 mg/dL (ref 0.0–0.3)
Total Bilirubin: 0.4 mg/dL (ref 0.2–1.2)
Total Protein: 6.9 g/dL (ref 6.0–8.3)

## 2022-07-28 LAB — TSH: TSH: 3.15 u[IU]/mL (ref 0.35–5.50)

## 2022-07-28 LAB — PSA: PSA: 7.34 ng/mL — ABNORMAL HIGH (ref 0.10–4.00)

## 2022-07-28 MED ORDER — FLUTICASONE PROPIONATE 50 MCG/ACT NA SUSP: 2.0000 | Freq: Every day | NASAL | 6 refills | Status: AC

## 2022-07-28 NOTE — Progress Notes (Signed)
Established Patient Office Visit  Subjective   Patient ID: Daniel Sullivan, male    DOB: 1951/10/30  Age: 71 y.o. MRN: [id number]  Chief Complaint  Patient presents with   Annual Exam    HPI   [name] is here for physical exam.  He has history of hypertension, history of DVT, allergic rhinitis, GERD, elevated PSA, hyperlipidemia.  Followed by urologist in [place].  Stays active with exercise.  Has had occasional dyspnea and dizziness with climbing stairs but frequently rides 1-1/2 to 2 hours/day without difficulty.  He has a brother that died of melanoma.  He sees dermatologist every 6 months and saw them yesterday.  Health maintenance reviewed  -Shingles vaccine complete -Pneumonia vaccines complete -Previous hepatitis C screen negative -Tetanus due 2033 -Colonoscopy due in 7 to 8 years  Family history-mother died age 33 unknown cause.  He had a brother that died age 57 of melanoma complications.  Father died age 46 from suicide.  His father had history of alcohol abuse.  He has another brother who is alive and well.   Social history-quit smoking 1989 after about 10-year history.  Occasional alcohol use.  Retired from [employer].  Exercises regularly especially with cycling.  Does a lot of walking as well.  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 His smoking use  included cigarettes. He has a 7.50 pack-year smoking history. He has never used smokeless tobacco. He reports current alcohol use. He reports that he does not use drugs. family history includes Alcohol abuse in his father; Melanoma (age of onset: 68) in his brother; Suicidality in his father. No Known Allergies  Review of Systems  Constitutional:  Negative for chills, fever, malaise/fatigue and weight loss.  HENT:  Negative for hearing loss.   Eyes:  Negative for blurred vision and double vision.  Respiratory:  Negative for cough and shortness of breath.   Cardiovascular:  Negative for chest pain, palpitations and leg swelling.  Gastrointestinal:  Negative for abdominal pain, blood in stool, constipation and diarrhea.  Genitourinary:  Negative for dysuria.  Skin:  Negative for rash.  Neurological:  Negative for dizziness, speech change, seizures, loss of consciousness and headaches.  Psychiatric/Behavioral:  Negative for depression.       Objective:     BP (!) 142/80 (BP Location: Left Arm, Cuff Size: Normal)   Pulse 80   Temp 98 F (36.7 C) (Oral)   Ht 5' 9.29" (1.76 m)   Wt 169 lb 1.6 oz (76.7 kg)   SpO2 98%   BMI 24.76 kg/m    Physical Exam Vitals reviewed.  Constitutional:      General: He is not in acute distress.    Appearance: He is well-developed.  HENT:  Head: Normocephalic and atraumatic.     Right Ear: External ear normal.     Left Ear: External ear normal.  Eyes:     Conjunctiva/sclera: Conjunctivae normal.     Pupils: Pupils are equal, round, and reactive to light.  Neck:     Thyroid: No thyromegaly.  Cardiovascular:     Rate and Rhythm: Normal rate and regular rhythm.     Heart sounds: Normal heart sounds. No murmur heard. Pulmonary:     Effort: No respiratory distress.     Breath sounds: No wheezing or rales.  Abdominal:     General: Bowel sounds are normal. There is no distension.     Palpations: Abdomen is soft. There is no mass.      Tenderness: There is no abdominal tenderness. There is no guarding or rebound.  Musculoskeletal:     Cervical back: Normal range of motion and neck supple.     Right lower leg: No edema.     Left lower leg: No edema.     Comments: Prominent varicose veins lower extremities  Lymphadenopathy:     Cervical: No cervical adenopathy.  Skin:    Findings: No rash.  Neurological:     Mental Status: He is alert and oriented to person, place, and time.     Cranial Nerves: No cranial nerve deficit.      No results found for any visits on 07/28/22.    The 10-year ASCVD risk score (Arnett DK, et al., 2019) is: 28.6%    Assessment & Plan:   Problem List Items Addressed This Visit   None Visit Diagnoses     Physical exam    -  Primary   Relevant Orders   Basic metabolic panel   CBC with Differential/Platelet   Hepatic function panel   Lipid panel   TSH   Elevated PSA       Relevant Orders   PSA     -Obtain screening labs as above -Continue annual flu vaccine.  He plans to get at pharmacy -Refilled Flonase for 1 year -Other health maintenance as above up-to-date -Continue regular exercise habits -Continue annual follow-up with dermatology especially given family history of melanoma.  No follow-ups on file.    Carolann Littler, MD

## 2022-07-28 NOTE — Progress Notes (Signed)
This encounter was created in error - please disregard.

## 2022-07-29 NOTE — Addendum Note (Signed)
Addended by: Nilda Riggs on: 07/29/2022 11:18 AM   Modules accepted: Orders

## 2022-08-02 ENCOUNTER — Telehealth: Payer: Self-pay | Admitting: Family Medicine

## 2022-08-02 NOTE — Telephone Encounter (Signed)
Left message for patient to call back and schedule Medicare Annual Wellness Visit (AWV) either virtually or in office. Left  my Daniel Sullivan number [phone]   awvi 02/04/16 per [facility]  ; please schedule at anytime with [facility] Nurse Health Advisor 1 or 2   This should be a 45 minute visit.

## 2022-08-10 ENCOUNTER — Encounter: Payer: Self-pay | Admitting: Family Medicine

## 2022-08-12 ENCOUNTER — Other Ambulatory Visit (INDEPENDENT_AMBULATORY_CARE_PROVIDER_SITE_OTHER): Payer: Medicare HMO

## 2022-08-12 ENCOUNTER — Ambulatory Visit (INDEPENDENT_AMBULATORY_CARE_PROVIDER_SITE_OTHER): Payer: Medicare HMO

## 2022-08-12 DIAGNOSIS — D649 Anemia, unspecified: Secondary | ICD-10-CM

## 2022-08-12 DIAGNOSIS — E538 Deficiency of other specified B group vitamins: Secondary | ICD-10-CM

## 2022-08-12 LAB — CBC WITH DIFFERENTIAL/PLATELET
Basophils Absolute: 0 10*3/uL (ref 0.0–0.1)
Basophils Relative: 0.6 % (ref 0.0–3.0)
Eosinophils Absolute: 0.3 10*3/uL (ref 0.0–0.7)
Eosinophils Relative: 4.6 % (ref 0.0–5.0)
HCT: 30.6 % — ABNORMAL LOW (ref 39.0–52.0)
Hemoglobin: 9.9 g/dL — ABNORMAL LOW (ref 13.0–17.0)
Lymphocytes Relative: 20.3 % (ref 12.0–46.0)
Lymphs Abs: 1.1 10*3/uL (ref 0.7–4.0)
MCHC: 32.2 g/dL (ref 30.0–36.0)
MCV: 83.5 fl (ref 78.0–100.0)
Monocytes Absolute: 0.6 10*3/uL (ref 0.1–1.0)
Monocytes Relative: 10.8 % (ref 3.0–12.0)
Neutro Abs: 3.5 10*3/uL (ref 1.4–7.7)
Neutrophils Relative %: 63.7 % (ref 43.0–77.0)
Platelets: 263 10*3/uL (ref 150.0–400.0)
RBC: 3.66 Mil/uL — ABNORMAL LOW (ref 4.22–5.81)
RDW: 15.2 % (ref 11.5–15.5)
WBC: 5.5 10*3/uL (ref 4.0–10.5)

## 2022-08-12 LAB — FERRITIN: Ferritin: 13.2 ng/mL — ABNORMAL LOW (ref 22.0–322.0)

## 2022-08-12 LAB — FOLATE: Folate: >23.9 ng/mL (ref >5.9)

## 2022-08-12 LAB — VITAMIN B12: Vitamin B-12: 166 pg/mL — ABNORMAL LOW (ref 211–911)

## 2022-08-12 MED ORDER — CYANOCOBALAMIN 1000 MCG/ML IJ SOLN
1000.0000 ug | Freq: Once | INTRAMUSCULAR | Status: AC
  Administered 2022-08-12: 1000 ug via INTRAMUSCULAR

## 2022-08-12 NOTE — Progress Notes (Signed)
Per orders of Dr. Jerilee Hoh, injection of Vit B12 1000 mcg given by [facility]. Patient tolerated injection well.   Next VitB 12 injection is due next week.

## 2022-08-13 LAB — IRON AND TIBC
Iron Saturation: 6 % — CL (ref 15–55)
Iron: 21 ug/dL — ABNORMAL LOW (ref 38–169)
Total Iron Binding Capacity: 369 ug/dL (ref 250–450)
UIBC: 348 ug/dL — ABNORMAL HIGH (ref 111–343)

## 2022-08-17 ENCOUNTER — Encounter: Payer: Self-pay | Admitting: Family Medicine

## 2022-08-19 ENCOUNTER — Ambulatory Visit (INDEPENDENT_AMBULATORY_CARE_PROVIDER_SITE_OTHER): Payer: Medicare HMO

## 2022-08-19 DIAGNOSIS — E538 Deficiency of other specified B group vitamins: Secondary | ICD-10-CM | POA: Diagnosis not present

## 2022-08-19 MED ORDER — CYANOCOBALAMIN 1000 MCG/ML IJ SOLN
1000.0000 ug | Freq: Once | INTRAMUSCULAR | Status: AC
  Administered 2022-08-19: 1000 ug via INTRAMUSCULAR

## 2022-08-19 NOTE — Progress Notes (Signed)
Per orders of Dr. Jerilee Hoh, injection of VitB 12  given by [facility]. Patient tolerated injection well.   Patient is scheduled for Vit B12 injection next week

## 2022-08-20 ENCOUNTER — Telehealth: Payer: Self-pay | Admitting: Family Medicine

## 2022-08-20 NOTE — Telephone Encounter (Signed)
Patient is due for awvi 02/04/16 per [facility]

## 2022-08-20 NOTE — Telephone Encounter (Signed)
Spoke with patient to schedule awv.  Patient was driving and could not talk  I will call back later to schedule

## 2022-08-24 ENCOUNTER — Ambulatory Visit (INDEPENDENT_AMBULATORY_CARE_PROVIDER_SITE_OTHER): Payer: Medicare HMO

## 2022-08-24 DIAGNOSIS — E538 Deficiency of other specified B group vitamins: Secondary | ICD-10-CM

## 2022-08-24 DIAGNOSIS — Z23 Encounter for immunization: Secondary | ICD-10-CM

## 2022-08-24 MED ORDER — CYANOCOBALAMIN 1000 MCG/ML IJ SOLN
1000.0000 ug | INTRAMUSCULAR | Status: AC
  Administered 2022-08-24 – 2022-08-31 (×2): 1000 ug via INTRAMUSCULAR

## 2022-08-24 NOTE — Progress Notes (Signed)
Pt here for weekly B12 injection #3 of 4 per Dr Elease Hashimoto.  B12 1088mg given IM left deltoid and pt tolerated injection well.  Next B12 injection scheduled for 08/31/22.

## 2022-08-31 ENCOUNTER — Ambulatory Visit (INDEPENDENT_AMBULATORY_CARE_PROVIDER_SITE_OTHER): Payer: Medicare HMO

## 2022-08-31 DIAGNOSIS — E538 Deficiency of other specified B group vitamins: Secondary | ICD-10-CM | POA: Diagnosis not present

## 2022-08-31 NOTE — Progress Notes (Signed)
Pt here for weekly B12 #4 of 4 injection per Dr Elease Hashimoto.  B12 1052mg given IM left deltoid and pt tolerated injection well.  Pt advised to start oral B12 10032m daily starting tomorrow. Pt verb understanding. Lab appt made for 11/23/22

## 2022-09-03 ENCOUNTER — Encounter: Payer: Self-pay | Admitting: Family Medicine

## 2022-09-03 ENCOUNTER — Ambulatory Visit (INDEPENDENT_AMBULATORY_CARE_PROVIDER_SITE_OTHER): Payer: Medicare HMO | Admitting: Family Medicine

## 2022-09-03 VITALS — BP 136/70 | HR 86 | Temp 97.8°F | Ht 69.29 in | Wt 169.3 lb

## 2022-09-03 DIAGNOSIS — R42 Dizziness and giddiness: Secondary | ICD-10-CM | POA: Diagnosis not present

## 2022-09-03 DIAGNOSIS — R0609 Other forms of dyspnea: Secondary | ICD-10-CM | POA: Diagnosis not present

## 2022-09-03 DIAGNOSIS — D509 Iron deficiency anemia, unspecified: Secondary | ICD-10-CM | POA: Diagnosis not present

## 2022-09-03 NOTE — Progress Notes (Signed)
Established Patient Office Visit  Subjective   Patient ID: Daniel Sullivan, male    DOB: 18-Jun-1951  Age: 71 y.o. MRN: [id number]  Chief Complaint  Patient presents with   Dizziness    Patient complains of vertigo, x4 weeks     HPI   Chief complaint listed above was "vertigo "for 4 weeks.  On further clarification he denies any vertigo whatsoever.  He is describing some intermittent dizziness and dyspnea with certain exertional activities.  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  He has noted dizziness with things like lifting but no history of syncope.  Denies any chest pressure.  He had heat exhaustion he states 4 times in his life and seems more prone in general to heat illness.  He has noted when he goes from squat to stand he tends to get fairly dizzy.  He does have hypertension history and is treated with amlodipine and losartan and blood pressures been stable.  No recent orthostatic changes.  He had EKG back in June which was unremarkable.  No postprandial symptoms.  Does have anemia currently with both low iron and low B12.  We did 1 month of B12 injections and is now taking oral replacement daily.  Also on iron supplement.  Pending follow-up with GI.  Recent ferritin level of 13.  No obvious source of blood loss such as bloody stools, melena, etc.  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 His smoking use included cigarettes. He has a 7.50 pack-year smoking history. He has never used smokeless tobacco. He reports current alcohol use. He reports that he does not use drugs. family history includes Alcohol abuse in his father; Melanoma (age of onset: 75) in his brother; Suicidality in his father. No Known Allergies  Review of Systems  Constitutional:  Negative for chills, fever and weight loss.  Respiratory:  Positive for shortness of breath. Negative for cough and hemoptysis.        See HPI  Cardiovascular:  Negative for chest pain.  Gastrointestinal:  Negative for abdominal pain.  Neurological:  Positive for dizziness. Negative for focal weakness, seizures, loss of consciousness and headaches.      Objective:     BP 136/70 (BP Location: Left Arm, Patient Position: Sitting, Cuff Size: Normal)   Pulse 86   Temp 97.8 F (36.6 C) (Oral)   Ht 5' 9.29" (1.76 m)   Wt 169 lb 4.8 oz (76.8 kg)   SpO2 97%   BMI 24.79 kg/m  BP Readings from Last 3 Encounters:  09/03/22 136/70  07/28/22 (!) 142/80  05/25/22 (!) 157/80   Wt Readings from Last  3 Encounters:  09/03/22 169 lb 4.8 oz (76.8 kg)  07/28/22 169 lb 1.6 oz (76.7 kg)  05/25/22 163 lb 9.6 oz (74.2 kg)      Physical Exam Vitals reviewed.  Constitutional:      Appearance: Normal appearance.  Cardiovascular:     Rate and Rhythm: Normal rate and regular rhythm.  Pulmonary:     Effort: Pulmonary effort is normal.     Breath sounds: Normal breath sounds. No wheezing or rales.  Musculoskeletal:     Right lower leg: No edema.     Left lower leg: No edema.  Neurological:     Mental Status: He is alert.      No results found for any visits on 09/03/22.  Last CBC Lab Results  Component Value Date   WBC 5.5 08/12/2022   HGB 9.9 (L) 08/12/2022   HCT 30.6 (L) 08/12/2022   MCV 83.5  08/12/2022   MCH 30.4 03/29/2022   RDW 15.2 08/12/2022   PLT 263.0 46/56/8127   Last metabolic panel Lab Results  Component Value Date   GLUCOSE 93 07/28/2022   NA 138 07/28/2022   K 4.5 07/28/2022   CL 102 07/28/2022   CO2 29 07/28/2022   BUN 16 07/28/2022   CREATININE 0.93 07/28/2022   GFRNONAA >60 03/29/2022   CALCIUM 8.6 07/28/2022   PROT 6.9 07/28/2022   ALBUMIN 4.2 07/28/2022   BILITOT 0.4 07/28/2022   ALKPHOS 57 07/28/2022   AST 27 07/28/2022   ALT 14 07/28/2022   ANIONGAP 9 03/29/2022   Last thyroid functions Lab Results  Component Value Date   TSH 3.15 07/28/2022   Last vitamin B12 and Folate Lab Results  Component Value Date   VITAMINB12 166 (L) 08/12/2022   FOLATE >23.9 08/12/2022      The 10-year ASCVD risk score (Arnett DK, et al., 2019) is: 21.6%    Assessment & Plan:   #1 recent exertional dyspnea and dizziness.  Symptoms are somewhat inconsistent.  Tends to occur less with cycling and more with lifting and squatting.  No prominent murmurs on exam.  Denies any associated chest pressure.  Recent EKG unremarkable.  We did discuss the fact that his anemia may be contributing somewhat.  -Stressed importance of good hydration -Change positions slowly when squatted to standing or seated to standing -Set up stress echocardiogram to further assess  #2 anemia.  Mixed with B12 deficiency and iron deficiency.  He has follow-up pending with GI.  Continue daily B12 and iron supplementation.  No follow-ups on file.    Carolann Littler, MD

## 2022-09-03 NOTE — Patient Instructions (Addendum)
Will be setting up stress echocardiogram   Be sure to keep follow up with GI

## 2022-09-04 ENCOUNTER — Other Ambulatory Visit: Payer: Self-pay | Admitting: Family Medicine

## 2022-09-06 NOTE — Addendum Note (Signed)
Addended by: Eulas Post on: 09/06/2022 05:36 PM   Modules accepted: Orders

## 2022-09-07 ENCOUNTER — Ambulatory Visit: Payer: Medicare HMO

## 2022-09-16 ENCOUNTER — Ambulatory Visit: Payer: Medicare HMO | Admitting: Gastroenterology

## 2022-09-16 ENCOUNTER — Encounter: Payer: Self-pay | Admitting: Gastroenterology

## 2022-09-16 VITALS — BP 122/68 | HR 83 | Ht 65.0 in | Wt 169.8 lb

## 2022-09-16 DIAGNOSIS — E538 Deficiency of other specified B group vitamins: Secondary | ICD-10-CM | POA: Diagnosis not present

## 2022-09-16 DIAGNOSIS — D509 Iron deficiency anemia, unspecified: Secondary | ICD-10-CM | POA: Diagnosis not present

## 2022-09-16 MED ORDER — FAMOTIDINE 20 MG PO TABS: 20.0000 mg | ORAL_TABLET | Freq: Two times a day (BID) | ORAL | 5 refills | Status: DC

## 2022-09-16 NOTE — Progress Notes (Signed)
09/16/2022 Daniel Sullivan [id number] 17-Aug-1951   HISTORY OF PRESENT ILLNESS: This is a 71 year old male who is a patient of Dr. Celesta Aver.  Historically has followed here for issues with chronic diarrhea that has been extensively evaluated over the course of a couple of years.  Work-up was negative so he began taking 2 Imodium every evening before bed.  He says that he has been doing that and it has controlled his symptoms well.  He is here today at the recommendation of his PCP for evaluation of low B12 and iron levels.  B12 levels have been low recently.  Has had injections.  He is only on omeprazole 20 mg daily.  He says that he tried to come off of it in the past and had recurrent symptoms.  Iron levels low as well.  Hemoglobin 9.9 g down from 13.3 g 5 months ago.  No overt GI bleeding and no hemoccults have been performed.  Colonoscopy completely normal 2021 he had a capsule endoscopy in 2022.  Last EGD was many years ago.  Says that he eats a well balanced diet.   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 His smoking use included cigarettes. He has a 7.50 pack-year smoking history. He has never used smokeless tobacco. He reports current alcohol use. He reports that he does not use drugs. family history includes Alcohol abuse in his father; Melanoma (age of  onset: 41) in his brother; Suicidality in his father. No Known Allergies    Outpatient Encounter Medications as of 09/16/2022  Medication Sig   amLODipine (NORVASC) 5 MG tablet TAKE 1 TABLET BY MOUTH EVERY DAY   FLUAD QUADRIVALENT 0.5 ML injection    fluorouracil (EFUDEX) 5 % cream Apply 1 Application topically 2 (two) times daily.   fluticasone (FLONASE) 50 MCG/ACT nasal spray Place 2 sprays into both nostrils daily.   Glucosamine-Chondroit-Vit C-Mn TABS Take 1 tablet by mouth daily.   losartan (COZAAR) 50 MG tablet TAKE 1 TABLET BY MOUTH EVERY DAY   Multiple Vitamins-Minerals (MENS MULTI VITAMIN & MINERAL PO) Take 1 tablet by mouth daily.   omeprazole (PRILOSEC) 20 MG capsule TAKE 1 CAPSULE BY MOUTH EVERY DAY   pramipexole (MIRAPEX) 0.25 MG tablet Take 1 tablet (0.25 mg total) by mouth at bedtime.   pravastatin (PRAVACHOL) 40 MG tablet TAKE 1 TABLET BY MOUTH EVERY DAY (Patient taking differently: Take 40 mg by mouth daily.)   sildenafil (VIAGRA) 100 MG tablet TAKE 1/2 - 1 TABLET BY MOUTH DAILY AS NEEDED FOR ERECTILE DYSFUNCTION   SODIUM FLUORIDE 5000 PPM 1.1 % CREA dental cream Take by mouth as directed.   vitamin C (ASCORBIC ACID) 500 MG tablet Take 500  mg by mouth daily.   No facility-administered encounter medications on file as of 09/16/2022.    REVIEW OF SYSTEMS  : All other systems reviewed and negative except where noted in the History of Present Illness.   PHYSICAL EXAM: BP 122/68   Pulse 83   Ht '5\' 5"'$  (1.651 m)   Wt 169 lb 12.8 oz (77 kg)   SpO2 96%   BMI 28.26 kg/m  General: Well developed white male in no acute distress Head: Normocephalic and atraumatic Eyes:  Sclerae anicteric, conjunctiva pink. Ears: Normal auditory acuity Lungs: Clear throughout to auscultation; no W/R/R. Heart: Regular rate and rhythm; no M/R/G. Abdomen: Soft, non-distended.  BS present.  Non-tender. Musculoskeletal: Symmetrical with no gross deformities  Skin: No lesions on visible  extremities Extremities: No edema  Neurological: Alert oriented x 4, grossly non-focal Psychological:  Alert and cooperative. Normal mood and affect  ASSESSMENT AND PLAN: *Vitamin B12 deficiency: Levels have been low recently.  Has had injections.  PPIs can affect vitamin B12 absorption.  He is only on omeprazole 20 mg daily.  He says that he tried to come off of it in the past and had recurrent symptoms.  He will try to discontinue it and will try Pepcid 20 mg twice daily instead.  We will plan for EGD. *Iron deficiency anemia: Iron levels low.  Hemoglobin 9.9 g down from 13.3 g 5 months ago.  No overt GI bleeding and no hemoccults have been performed.  Colonoscopy completely normal 2021 he had a capsule endoscopy in 2022.  Last EGD was many years ago.  EGD with Dr. Celesta Aver being scheduled.  The risks, benefits, and alternatives to EGD were discussed with the patient and he consents to proceed.  *Chronic diarrhea: Much improved with 2 Imodium at bedtime each night.   CC:  Eulas Post, MD

## 2022-09-16 NOTE — Patient Instructions (Signed)
We have sent the following medications to your pharmacy for you to pick up at your convenience: Pepcid 20 mg twice daily  Stop Omeprazole.  You have been scheduled for an endoscopy. Please follow written instructions given to you at your visit today. If you use inhalers (even only as needed), please bring them with you on the day of your procedure.  _______________________________________________________  If you are age 73 or older, your body mass index should be between 23-30. Your Body mass index is 28.26 kg/m. If this is out of the aforementioned range listed, please consider follow up with your Primary Care Provider.  If you are age 75 or younger, your body mass index should be between 19-25. Your Body mass index is 28.26 kg/m. If this is out of the aformentioned range listed, please consider follow up with your Primary Care Provider.   ________________________________________________________  The [facility] providers would like to encourage you to use [facility] to communicate with providers for non-urgent requests or questions.  Due to long hold times on the telephone, sending your provider a message by [facility] may be a faster and more efficient way to get a response.  Please allow 48 business hours for a response.  Please remember that this is for non-urgent requests.  _______________________________________________________

## 2022-09-20 ENCOUNTER — Telehealth: Payer: Self-pay | Admitting: Family Medicine

## 2022-09-20 NOTE — Telephone Encounter (Signed)
Left message for patient to call back and schedule Medicare Annual Wellness Visit (AWV) either virtually or in office. Left  my jabber number [phone]    awvi 02/04/16 per [facility]  please schedule with Nurse Health Adviser   45 min for awv-i and in office appointments 30 min for awv-s  phone/virtual appointments  

## 2022-10-11 ENCOUNTER — Other Ambulatory Visit: Payer: Self-pay | Admitting: Family Medicine

## 2022-10-19 ENCOUNTER — Telehealth: Payer: Self-pay | Admitting: Family Medicine

## 2022-10-19 DIAGNOSIS — C44729 Squamous cell carcinoma of skin of left lower limb, including hip: Secondary | ICD-10-CM | POA: Diagnosis not present

## 2022-10-19 DIAGNOSIS — D485 Neoplasm of uncertain behavior of skin: Secondary | ICD-10-CM | POA: Diagnosis not present

## 2022-10-19 DIAGNOSIS — L570 Actinic keratosis: Secondary | ICD-10-CM | POA: Diagnosis not present

## 2022-10-19 NOTE — Telephone Encounter (Signed)
Spoke with patient to  schedule Medicare Annual Wellness Visit (AWV) either virtually or in office.   He stated he would need to call back to schedule he doesn't know his schedule right now    awvi 02/04/16 per [facility]  please schedule with Nurse Health Adviser   45 min for awv-i and in office appointments 30 min for awv-s  phone/virtual appointments

## 2022-10-20 ENCOUNTER — Encounter: Payer: Medicare HMO | Admitting: Internal Medicine

## 2022-10-20 ENCOUNTER — Encounter: Payer: Self-pay | Admitting: Family Medicine

## 2022-10-21 ENCOUNTER — Encounter: Payer: Self-pay | Admitting: Internal Medicine

## 2022-10-21 ENCOUNTER — Ambulatory Visit (AMBULATORY_SURGERY_CENTER): Payer: Medicare HMO | Admitting: Internal Medicine

## 2022-10-21 VITALS — BP 162/83 | HR 78 | Temp 96.2°F | Resp 12 | Ht 65.0 in | Wt 169.0 lb

## 2022-10-21 DIAGNOSIS — D509 Iron deficiency anemia, unspecified: Secondary | ICD-10-CM

## 2022-10-21 DIAGNOSIS — E538 Deficiency of other specified B group vitamins: Secondary | ICD-10-CM | POA: Diagnosis not present

## 2022-10-21 DIAGNOSIS — K31819 Angiodysplasia of stomach and duodenum without bleeding: Secondary | ICD-10-CM | POA: Diagnosis not present

## 2022-10-21 DIAGNOSIS — K2950 Unspecified chronic gastritis without bleeding: Secondary | ICD-10-CM

## 2022-10-21 DIAGNOSIS — K2970 Gastritis, unspecified, without bleeding: Secondary | ICD-10-CM | POA: Diagnosis not present

## 2022-10-21 DIAGNOSIS — J45909 Unspecified asthma, uncomplicated: Secondary | ICD-10-CM | POA: Diagnosis not present

## 2022-10-21 DIAGNOSIS — I10 Essential (primary) hypertension: Secondary | ICD-10-CM | POA: Diagnosis not present

## 2022-10-21 MED ORDER — SODIUM CHLORIDE 0.9 % IV SOLN: 500.0000 mL | INTRAVENOUS | Status: DC

## 2022-10-21 NOTE — Progress Notes (Signed)
Vital signs checked by:DT  The medical and surgical history was reviewed and verified with the patient.  

## 2022-10-21 NOTE — Op Note (Signed)
[facility] Patient Name: Daniel Sullivan Procedure Date: 10/21/2022 10:50 AM MRN: [id number] Endoscopist: Gatha Mayer , MD, [id number] Age: 71 Referring MD:  Date of Birth: [birth year] Gender: Male Account #: [id number] Procedure:                Upper GI endoscopy Indications:              Iron deficiency anemia Medicines:                Monitored Anesthesia Care Procedure:                Pre-Anesthesia Assessment:                           - Prior to the procedure, a History and Physical                            was performed, and patient medications and                            allergies were reviewed. The patient's tolerance of                            previous anesthesia was also reviewed. The risks                            and benefits of the procedure and the sedation                            options and risks were discussed with the patient.                            All questions were answered, and informed consent                            was obtained. Prior Anticoagulants: The patient has                            taken no anticoagulant or antiplatelet agents. ASA                            Grade Assessment: II - A patient with mild systemic                            disease. After reviewing the risks and benefits,                            the patient was deemed in satisfactory condition to                            undergo the procedure.                           After obtaining informed consent, the endoscope was  passed under direct vision. Throughout the                            procedure, the patient's blood pressure, pulse, and                            oxygen saturations were monitored continuously. The                            Endoscope was introduced through the mouth, and                            advanced to the second part of duodenum. The upper                            GI endoscopy was accomplished  without difficulty.                            The patient tolerated the procedure well. Scope In: Scope Out: Findings:                 Inflammation characterized by congestion (edema),                            erythema and friability was found in the entire                            examined stomach. Biopsies were taken with a cold                            forceps for histology. Verification of patient                            identification for the specimen was done. Estimated                            blood loss was minimal.                           A single 1 mm angiodysplastic lesion without                            bleeding was found in the second portion of the                            duodenum.                           The exam was otherwise without abnormality.                           The cardia and gastric fundus were otherwise normal                            on retroflexion.  Biopsies for histology were taken with a cold                            forceps in the entire duodenum for evaluation of                            celiac disease. Complications:            No immediate complications. Estimated Blood Loss:     Estimated blood loss was minimal. Impression:               - Gastritis. Biopsied.                           - A single non-bleeding angiodysplastic lesion in                            the duodenum. We knew he had from 2022 capsule                            endoscopy                           - The examination was otherwise normal.                           - Biopsies were taken with a cold forceps for                            evaluation of celiac disease. Recommendation:           - Patient has a contact number available for                            emergencies. The signs and symptoms of potential                            delayed complications were discussed with the                            patient. Return to  normal activities tomorrow.                            Written discharge instructions were provided to the                            patient.                           - Resume previous diet.                           - Continue present medications.                           - Await pathology results. Stay on B12 and ferrous  sulfate + PPI - he tried to stop PPI but had fast                            rebound reflux so went back on Gatha Mayer, MD 10/21/2022 11:23:22 AM This report has been signed electronically.

## 2022-10-21 NOTE — Progress Notes (Signed)
Called to room to assist during endoscopic procedure.  Patient ID and intended procedure confirmed with present staff. Received instructions for my participation in the procedure from the performing physician.  

## 2022-10-21 NOTE — Progress Notes (Signed)
Sedate, gd SR, tolerated procedure well, VSS, report to RN 

## 2022-10-21 NOTE — Patient Instructions (Addendum)
Handout provided about gastritis and reflux.   Resume previous diet.  Continue present medications.  Stay on B12 and ferrous sulfate plus PPI.    Await pathology results.    YOU HAD AN ENDOSCOPIC PROCEDURE TODAY AT [facility]:   Refer to the procedure report that was given to you for any specific questions about what was found during the examination.  If the procedure report does not answer your questions, please call your gastroenterologist to clarify.  If you requested that your care partner not be given the details of your procedure findings, then the procedure report has been included in a sealed envelope for you to review at your convenience later.  YOU SHOULD EXPECT: Some feelings of bloating in the abdomen. Passage of more gas than usual.  Walking can help get rid of the air that was put into your GI tract during the procedure and reduce the bloating. If you had a lower endoscopy (such as a colonoscopy or flexible sigmoidoscopy) you may notice spotting of blood in your stool or on the toilet paper. If you underwent a bowel prep for your procedure, you may not have a normal bowel movement for a few days.  Please Note:  You might notice some irritation and congestion in your nose or some drainage.  This is from the oxygen used during your procedure.  There is no need for concern and it should clear up in a day or so.  SYMPTOMS TO REPORT IMMEDIATELY:   Following upper endoscopy (EGD)  Vomiting of blood or coffee ground material  New chest pain or pain under the shoulder blades  Painful or persistently difficult swallowing  New shortness of breath  Fever of 100F or higher  Black, tarry-looking stools  For urgent or emergent issues, a gastroenterologist can be reached at any hour by calling [phone]. Do not use MyChart messaging for urgent concerns.    DIET:  We do recommend a small meal at first, but then you may proceed to your regular diet.  Drink plenty of fluids  but you should avoid alcoholic beverages for 24 hours.  ACTIVITY:  You should plan to take it easy for the rest of today and you should NOT DRIVE or use heavy machinery until tomorrow (because of the sedation medicines used during the test).    FOLLOW UP: Our staff will call the number listed on your records the next business day following your procedure.  We will call around 7:15- 8:00 am to check on you and address any questions or concerns that you may have regarding the information given to you following your procedure. If we do not reach you, we will leave a message.     If any biopsies were taken you will be contacted by phone or by letter within the next 1-3 weeks.  Please call us at [phone] if you have not heard about the biopsies in 3 weeks.    SIGNATURES/CONFIDENTIALITY: You and/or your care partner have signed paperwork which will be entered into your electronic medical record.  These signatures attest to the fact that that the information above on your After Visit Summary has been reviewed and is understood.  Full responsibility of the confidentiality of this discharge information lies with you and/or your care-partner.The stomach looks mildly inflamed. I took biopsies of that and the small intestine (check for gluten allergy).  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 We can cauterize these but that needs to be done at the hospital. If iron supplements treat the iron deficiency don't have to.  Once I get the results will contact you.  Please resume medications and your normal diet.  I appreciate the opportunity to care for you. Gatha Mayer, MD, [facility]

## 2022-10-21 NOTE — Progress Notes (Signed)
[facility] History and Physical   Primary Care Physician:  Eulas Post, MD   Reason for Procedure:   B12 and Fe deficiency anemia  Plan:    EGD     HPI: Daniel Sullivan is a 71 y.o. male here for evaluation of above. Was seen in Oct 23 in office - see that note also.   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 Authorizing Provider  amLODipine (NORVASC) 5 MG tablet TAKE 1 TABLET BY MOUTH EVERY DAY 10/12/22  Yes Burchette, Alinda Sierras, MD  Glucosamine-Chondroit-Vit C-Mn TABS Take 1 tablet by mouth daily.   Yes [provider]  losartan (COZAAR) 50 MG tablet TAKE 1 TABLET BY MOUTH EVERY DAY 07/14/22  Yes Burchette, Alinda Sierras, MD  Multiple Vitamins-Minerals (MENS MULTI VITAMIN & MINERAL PO) Take 1 tablet by mouth daily.   Yes [provider]  omeprazole (PRILOSEC) 20 MG capsule TAKE 1 CAPSULE BY MOUTH EVERY DAY 04/22/22  Yes Burchette, Alinda Sierras, MD  pramipexole (MIRAPEX) 0.25 MG tablet Take 1 tablet (0.25 mg total) by mouth at bedtime. 05/12/22  Yes Burchette, Alinda Sierras, MD  pravastatin (PRAVACHOL) 40 MG tablet TAKE 1 TABLET BY MOUTH EVERY DAY Patient taking differently: Take 40 mg by mouth daily. 02/08/22  Yes Burchette, Alinda Sierras, MD  sildenafil (VIAGRA) 100 MG tablet TAKE 1/2 - 1 TABLET BY MOUTH DAILY AS  NEEDED FOR ERECTILE DYSFUNCTION 09/06/22  Yes Burchette, Alinda Sierras, MD  vitamin C (ASCORBIC ACID) 500 MG tablet Take 500 mg by mouth daily.   Yes [provider]  fluorouracil (EFUDEX) 5 % cream Apply 1 Application topically 2 (two) times daily. Patient not taking: Reported on 10/21/2022 04/28/22   [provider]  fluticasone (FLONASE) 50 MCG/ACT nasal spray Place 2 sprays into both nostrils daily. 07/28/22   Burchette, Alinda Sierras, MD  SODIUM FLUORIDE 5000 PPM 1.1 % CREA dental cream Take by mouth as directed. 07/06/22   [provider]    Current Outpatient Medications  Medication Sig Dispense Refill   amLODipine (NORVASC) 5 MG tablet TAKE 1 TABLET BY MOUTH EVERY DAY 90 tablet 0   Glucosamine-Chondroit-Vit C-Mn TABS Take 1 tablet by mouth daily.     losartan (COZAAR) 50 MG tablet TAKE 1 TABLET BY MOUTH EVERY DAY 90 tablet 0   Multiple Vitamins-Minerals (MENS MULTI VITAMIN & MINERAL PO) Take 1 tablet by mouth daily.     omeprazole (PRILOSEC) 20 MG capsule TAKE 1 CAPSULE BY MOUTH EVERY DAY 90 capsule 1   pramipexole (MIRAPEX) 0.25 MG tablet Take 1 tablet (0.25 mg total) by mouth at bedtime. 90 tablet 3   pravastatin (PRAVACHOL) 40 MG tablet  TAKE 1 TABLET BY MOUTH EVERY DAY (Patient taking differently: Take 40 mg by mouth daily.) 90 tablet 0   sildenafil (VIAGRA) 100 MG tablet TAKE 1/2 - 1 TABLET BY MOUTH DAILY AS NEEDED FOR ERECTILE DYSFUNCTION 10 tablet 1   vitamin C (ASCORBIC ACID) 500 MG tablet Take 500 mg by mouth daily.     fluorouracil (EFUDEX) 5 % cream Apply 1 Application topically 2 (two) times daily. (Patient not taking: Reported on 10/21/2022)     fluticasone (FLONASE) 50 MCG/ACT nasal spray Place 2 sprays into both nostrils daily. 16 g 6   SODIUM FLUORIDE 5000 PPM 1.1 % CREA dental cream Take by mouth as directed.     Current Facility-Administered Medications  Medication Dose Route Frequency Provider Last Rate Last Admin   0.9 %  sodium chloride infusion  500 mL  Intravenous Continuous Gatha Mayer, MD        Allergies as of 10/21/2022   (No Known Allergies)    Family History  Problem Relation Age of Onset   Alcohol abuse Father    Suicidality Father    Melanoma Brother 16   Stomach cancer Neg Hx    Colon cancer Neg Hx    Rectal cancer Neg Hx    Esophageal cancer Neg Hx     Social History   Socioeconomic History   Marital status: Widowed    Spouse name: Not on file   Number of children: Not on file   Years of education: Not on file   Highest education level: Bachelor's degree (e.g., BA, AB, BS)  Occupational History   Occupation: retired  Tobacco Use   Smoking status: Former    Packs/day: 0.50    Years: 15.00    Total pack years: 7.50    Types: Cigarettes    Quit date: 02/24/1988    Years since quitting: 34.6   Smokeless tobacco: Never  Vaping Use   Vaping Use: Never used  Substance and Sexual Activity   Alcohol use: Yes    Comment: occasionally   Drug use: No   Sexual activity: Yes    Partners: Female  Other Topics Concern   Not on file  Social History Narrative   Married, retired   Former smoker   + [place], no drugs   Social Determinants of Radio broadcast assistant Strain: [place]  (02/01/2022)   Overall Financial Resource Strain (CARDIA)    Difficulty of Paying Living Expenses: Not hard at all  Food Insecurity: No Food Insecurity (02/01/2022)   Hunger Vital Sign    Worried About Running Out of Food in the Last Year: Never true    [place] in the Last Year: Never true  Transportation Needs: No Transportation Needs (02/01/2022)   PRAPARE - Hydrologist (Medical): No    Lack of Transportation (Non-Medical): No  Physical Activity: Insufficiently Active (02/01/2022)   Exercise Vital Sign    Days of Exercise per Week: 3 days    Minutes of Exercise per Session: 40 min  Stress: No Stress Concern Present (02/01/2022)   [place]    Feeling of Stress : Only a little  Social Connections: Unknown (02/01/2022)   Social Connection and Isolation Panel [NHANES]    Frequency of Communication with Friends and Family: More than three times a week    Frequency of Social Gatherings with Friends and Family: Three times a week  Attends Religious Services: Patient refused    Active Member of Clubs or Organizations: No    Attends Archivist Meetings: Not on file    Marital Status: Living with partner  Intimate Partner Violence: Not on file    Review of Systems:  All other review of systems negative except as mentioned in the HPI.  Physical Exam: Vital signs BP (!) 142/81   Pulse 91   Temp (!) 96.2 F (35.7 C)   Ht '5\' 5"'$  (1.651 m)   Wt 169 lb (76.7 kg)   SpO2 98%   BMI 28.12 kg/m   General:   Alert,  Well-developed, well-nourished, pleasant and cooperative in NAD Lungs:  Clear throughout to auscultation.   Heart:  Regular rate and rhythm; no murmurs, clicks, rubs,  or gallops. Abdomen:  Soft, nontender and nondistended. Normal bowel sounds.   Neuro/Psych:  Alert and cooperative. Normal mood and affect. A and O x 3   '@Najae Filsaime'$  Simonne Maffucci, MD, [facility]astroenter[facility] [phone] (pager) 10/21/2022 10:57 AM@

## 2022-10-22 ENCOUNTER — Telehealth: Payer: Self-pay

## 2022-10-22 NOTE — Telephone Encounter (Signed)
  Follow up Call-     10/21/2022    9:53 AM 03/20/2020   10:05 AM  Call back number  Post procedure Call Back phone  # [phone]  Permission to leave phone message Yes Yes     Patient questions:  Do you have a fever, pain , or abdominal swelling? No. Pain Score  0 *  Have you tolerated food without any problems? Yes.    Have you been able to return to your normal activities? Yes.    Do you have any questions about your discharge instructions: Diet   No. Medications  No. Follow up visit  No.  Do you have questions or concerns about your Care? No.  Actions: * If pain score is 4 or above: No action needed, pain <4.

## 2022-10-26 ENCOUNTER — Ambulatory Visit (HOSPITAL_COMMUNITY): Payer: Medicare HMO | Attending: Cardiology

## 2022-10-26 DIAGNOSIS — R42 Dizziness and giddiness: Secondary | ICD-10-CM | POA: Diagnosis not present

## 2022-10-26 DIAGNOSIS — R0609 Other forms of dyspnea: Secondary | ICD-10-CM | POA: Diagnosis not present

## 2022-10-26 MED ORDER — PERFLUTREN LIPID MICROSPHERE
1.0000 mL | INTRAVENOUS | Status: AC | PRN
  Administered 2022-10-26 (×3): 1 mL via INTRAVENOUS

## 2022-11-08 ENCOUNTER — Other Ambulatory Visit: Payer: Medicare HMO

## 2022-11-08 DIAGNOSIS — D485 Neoplasm of uncertain behavior of skin: Secondary | ICD-10-CM | POA: Diagnosis not present

## 2022-11-08 DIAGNOSIS — C44729 Squamous cell carcinoma of skin of left lower limb, including hip: Secondary | ICD-10-CM | POA: Diagnosis not present

## 2022-11-08 DIAGNOSIS — I872 Venous insufficiency (chronic) (peripheral): Secondary | ICD-10-CM | POA: Diagnosis not present

## 2022-11-08 DIAGNOSIS — D649 Anemia, unspecified: Secondary | ICD-10-CM

## 2022-11-08 DIAGNOSIS — L609 Nail disorder, unspecified: Secondary | ICD-10-CM

## 2022-11-08 LAB — CBC WITH DIFFERENTIAL/PLATELET
Basophils Absolute: 0 10*3/uL (ref 0.0–0.1)
Basophils Relative: 0.4 % (ref 0.0–3.0)
Eosinophils Absolute: 0.2 10*3/uL (ref 0.0–0.7)
Eosinophils Relative: 2.7 % (ref 0.0–5.0)
HCT: 39.4 % (ref 39.0–52.0)
Hemoglobin: 12.9 g/dL — ABNORMAL LOW (ref 13.0–17.0)
Lymphocytes Relative: 15.1 % (ref 12.0–46.0)
Lymphs Abs: 1.1 10*3/uL (ref 0.7–4.0)
MCHC: 32.8 g/dL (ref 30.0–36.0)
MCV: 90.3 fl (ref 78.0–100.0)
Monocytes Absolute: 0.7 10*3/uL (ref 0.1–1.0)
Monocytes Relative: 9.9 % (ref 3.0–12.0)
Neutro Abs: 5.1 10*3/uL (ref 1.4–7.7)
Neutrophils Relative %: 71.9 % (ref 43.0–77.0)
Platelets: 216 10*3/uL (ref 150.0–400.0)
RBC: 4.37 Mil/uL (ref 4.22–5.81)
RDW: 18.3 % — ABNORMAL HIGH (ref 11.5–15.5)
WBC: 7.1 10*3/uL (ref 4.0–10.5)

## 2022-11-08 LAB — FERRITIN: Ferritin: 22.8 ng/mL (ref 22.0–322.0)

## 2022-11-08 LAB — VITAMIN B12: Vitamin B-12: 343 pg/mL (ref 211–911)

## 2022-11-11 ENCOUNTER — Encounter: Payer: Self-pay | Admitting: Internal Medicine

## 2022-11-15 ENCOUNTER — Other Ambulatory Visit: Payer: Self-pay

## 2022-11-15 DIAGNOSIS — R972 Elevated prostate specific antigen [PSA]: Secondary | ICD-10-CM

## 2022-11-16 ENCOUNTER — Other Ambulatory Visit: Payer: Medicare HMO

## 2022-11-16 DIAGNOSIS — R972 Elevated prostate specific antigen [PSA]: Secondary | ICD-10-CM | POA: Diagnosis not present

## 2022-11-17 ENCOUNTER — Encounter: Payer: Self-pay | Admitting: Urology

## 2022-11-17 LAB — PSA: Prostate Specific Ag, Serum: 9.8 ng/mL — ABNORMAL HIGH (ref 0.0–4.0)

## 2022-11-18 ENCOUNTER — Other Ambulatory Visit: Payer: Medicare HMO

## 2022-11-22 ENCOUNTER — Encounter: Payer: Self-pay | Admitting: Podiatry

## 2022-11-22 ENCOUNTER — Ambulatory Visit: Payer: Medicare HMO | Admitting: Podiatry

## 2022-11-22 VITALS — BP 163/94

## 2022-11-22 DIAGNOSIS — M79675 Pain in left toe(s): Secondary | ICD-10-CM | POA: Diagnosis not present

## 2022-11-22 DIAGNOSIS — B351 Tinea unguium: Secondary | ICD-10-CM | POA: Diagnosis not present

## 2022-11-22 DIAGNOSIS — M79674 Pain in right toe(s): Secondary | ICD-10-CM

## 2022-11-22 NOTE — Progress Notes (Signed)
Subjective:   Patient ID: Daniel Sullivan, male   DOB: 71 y.o.   MRN: [id number]   HPI Patient states he has had damage to different nailbeds especially the big nails and they can become bothersome very hard for him to cut his get back problems cannot reach them.  Patient does not smoke currently tries to be active   Review of Systems  All other systems reviewed and are negative.       Objective:  Physical Exam Vitals and nursing note reviewed.  Constitutional:      Appearance: He is well-developed.  Pulmonary:     Effort: Pulmonary effort is normal.  Musculoskeletal:        General: Normal range of motion.  Skin:    General: Skin is warm.  Neurological:     Mental Status: He is alert.     Neurovascular status mildly diminished but intact with patient found to have thick yellow brittle nailbeds with incurvation of the borders soreness and was noted to have good digital perfusion well-oriented x 3     Assessment:  Chronic mycotic nail infection 1-5 both feet with pain and nail damage disease hallux nails bilateral     Plan:  H&P reviewed condition debridement and nailbed sterile instrumentation advised this patient that eventually this may require nail removal educated him on this but at this point we will hold off

## 2022-11-23 ENCOUNTER — Other Ambulatory Visit: Payer: Medicare HMO

## 2022-11-23 ENCOUNTER — Ambulatory Visit: Payer: Medicare HMO | Admitting: Urology

## 2022-11-23 ENCOUNTER — Encounter: Payer: Self-pay | Admitting: Urology

## 2022-11-23 VITALS — BP 167/74 | HR 92 | Ht 65.0 in | Wt 169.0 lb

## 2022-11-23 DIAGNOSIS — C61 Malignant neoplasm of prostate: Secondary | ICD-10-CM | POA: Diagnosis not present

## 2022-11-23 DIAGNOSIS — R9721 Rising PSA following treatment for malignant neoplasm of prostate: Secondary | ICD-10-CM

## 2022-11-23 DIAGNOSIS — R972 Elevated prostate specific antigen [PSA]: Secondary | ICD-10-CM

## 2022-11-23 NOTE — Progress Notes (Addendum)
I, Daniel Sullivan,acting as a scribe for Daniel Espy, MD.,have documented all relevant documentation on the behalf of Daniel Espy, MD,as directed by  Daniel Espy, MD while in the presence of Daniel Espy, MD.   11/23/22 12:38 PM   Daniel Sullivan 31-May-1951 [id number]  Referring provider: Eulas Post, MD [place],  [place] [address]  Chief Complaint  Patient presents with   Elevated PSA    HPI: 71 year-old male with a personal history of low-risk prostate cancer on active surveillance who returns for his 6 month follow-up.   S/p prostate MRI in 01/2019 which showed no lesions, PI-RADS 1 ; volume 3.7 x 3.2 x 5.1 cm   He is also s/p biopsy in 2014.  Presumably this was negative, done by Dr. Kellie Simmering.   He underwent prostate biopsy on 10/20/2021 which revealed Gleason 3+3=6 involving 1 core at the right apex affecting 14%. TRUS 56.6 cc.    His most recent PSA was 9.8 on 11/16/2022.   He is on baby Aspirin and no longer taking Xarelto. He denies any urinary issues.   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   Diverticulosis    Elevated PSA    GERD (gastroesophageal reflux disease)    Hemorrhoids    History of blood clots    Hx of adenomatous colonic polyps    Hyperlipidemia    Hypertension    Hypogonadism in male     Surgical History: Past Surgical History:  Procedure Laterality Date   ANAL RECTAL MANOMETRY N/A 09/07/2017   Procedure: ANO RECTAL MANOMETRY;  Surgeon: Daniel Pole, MD;   [facility];  Service: Endoscopy;  Laterality: N/A;   COLONOSCOPY W/ BIOPSIES     HEMORRHOID BANDING      Home Medications:  Allergies as of 11/23/2022   No Known Allergies      Medication List        Accurate as of November 23, 2022 12:38 PM. If you have any questions, ask your nurse or doctor.          STOP taking these medications    fluorouracil 5 % cream Commonly known as: EFUDEX Stopped by: Daniel Espy, MD       TAKE these medications    amLODipine 5 MG tablet Commonly known as: NORVASC TAKE 1 TABLET BY MOUTH EVERY DAY   ascorbic acid 500 MG tablet Commonly known as: VITAMIN C Take 500 mg by mouth daily.   fluticasone 50 MCG/ACT nasal spray Commonly known as: FLONASE Place 2 sprays into both nostrils daily.   Glucosamine-Chondroit-Vit C-Mn Tabs Take 1 tablet by mouth daily.   losartan 50 MG tablet Commonly known as: COZAAR TAKE 1 TABLET BY MOUTH EVERY DAY   MENS MULTI VITAMIN & MINERAL PO Take 1 tablet by mouth daily.   omeprazole 20 MG  capsule Commonly known as: PRILOSEC TAKE 1 CAPSULE BY MOUTH EVERY DAY   pramipexole 0.25 MG tablet Commonly known as: Mirapex Take 1 tablet (0.25 mg total) by mouth at bedtime.   pravastatin 40 MG tablet Commonly known as: PRAVACHOL TAKE 1 TABLET BY MOUTH EVERY DAY   sildenafil 100 MG tablet Commonly known as: VIAGRA TAKE 1/2 - 1 TABLET BY MOUTH DAILY AS NEEDED FOR ERECTILE DYSFUNCTION   Sodium Fluoride 5000 PPM 1.1 % Crea dental cream Generic drug: sodium fluoride Take by mouth as directed.        Family History: Family History  Problem Relation Age of Onset   Alcohol abuse Father    Suicidality Father    Melanoma Brother 40   Stomach cancer Neg Hx    Colon cancer Neg Hx    Rectal cancer Neg Hx    Esophageal cancer Neg Hx     Social History:  reports that he quit smoking about 34 years ago. His smoking use included cigarettes. He has a 7.50 pack-year smoking history. He has  never used smokeless tobacco. He reports current alcohol use. He reports that he does not use drugs.   Physical Exam: BP (!) 167/74   Pulse 92   Ht '5\' 5"'$  (1.651 m)   Wt 169 lb (76.7 kg)   BMI 28.12 kg/m   Constitutional:  Alert and oriented, No acute distress. HEENT: [place] AT, moist mucus membranes.  Trachea midline, no masses. GU: normal sphincter tone, 50 cc prostate. He had a rubbery nodule at the right apex about a centimeter but no firm nodularity.  Neurologic: Grossly intact, no focal deficits, moving all 4 extremities. Psychiatric: Normal mood and affect.  Assessment & Plan:    Low risk prostate cancer on active surveillance - We will repeat an MRI of the prostate in light of rising PSA, he is previous for comparison -Overall PSA trend is upwards although has been in the 9 range in the past -He understands that if there are significant change on prostate MRI, would likely recommend repeat biopsy.  He will await our recommendations pending the results of this study - Repeat PSA in 6 months   Will call with MRI results, follow-up with PSA in 6 months   [place] [address], [place] [address] [phone]   I have reviewed the above documentation for accuracy and completeness, and I agree with the above.   Daniel Espy, MD

## 2022-12-02 ENCOUNTER — Other Ambulatory Visit: Payer: Self-pay | Admitting: Family Medicine

## 2022-12-13 ENCOUNTER — Telehealth: Payer: Self-pay | Admitting: Family Medicine

## 2022-12-13 NOTE — Telephone Encounter (Signed)
Left message for patient to call back and schedule Medicare Annual Wellness Visit (AWV) either virtually or in office. Left  my Herbie Drape number [phone]    awvi 02/04/16 per [facility]  please schedule with Nurse Health Adviser   45 min for awv-i and in office appointments 30 min for awv-s  phone/virtual appointments

## 2022-12-14 ENCOUNTER — Other Ambulatory Visit: Payer: Self-pay | Admitting: Family Medicine

## 2022-12-20 ENCOUNTER — Encounter: Payer: Self-pay | Admitting: Urology

## 2022-12-20 ENCOUNTER — Encounter: Payer: Self-pay | Admitting: Family Medicine

## 2022-12-21 ENCOUNTER — Ambulatory Visit
Admission: RE | Admit: 2022-12-21 | Discharge: 2022-12-21 | Disposition: A | Discharge status: Home / Self Care | Payer: Medicare HMO | Source: Ambulatory Visit | Attending: Urology | Admitting: Urology

## 2022-12-21 DIAGNOSIS — R972 Elevated prostate specific antigen [PSA]: Secondary | ICD-10-CM

## 2022-12-21 DIAGNOSIS — C61 Malignant neoplasm of prostate: Secondary | ICD-10-CM | POA: Diagnosis not present

## 2022-12-21 MED ORDER — GADOBUTROL 1 MMOL/ML IV SOLN
7.5000 mL | Freq: Once | INTRAVENOUS | Status: AC | PRN
  Administered 2022-12-21: 7.5 mL via INTRAVENOUS

## 2023-01-04 DIAGNOSIS — S81802A Unspecified open wound, left lower leg, initial encounter: Secondary | ICD-10-CM | POA: Diagnosis not present

## 2023-01-13 ENCOUNTER — Other Ambulatory Visit (INDEPENDENT_AMBULATORY_CARE_PROVIDER_SITE_OTHER): Payer: Medicare HMO

## 2023-01-13 ENCOUNTER — Ambulatory Visit: Payer: Medicare HMO | Admitting: Internal Medicine

## 2023-01-13 ENCOUNTER — Encounter: Payer: Self-pay | Admitting: Internal Medicine

## 2023-01-13 VITALS — BP 138/80 | HR 83 | Ht 69.0 in | Wt 179.2 lb

## 2023-01-13 DIAGNOSIS — K5520 Angiodysplasia of colon without hemorrhage: Secondary | ICD-10-CM

## 2023-01-13 DIAGNOSIS — D500 Iron deficiency anemia secondary to blood loss (chronic): Secondary | ICD-10-CM

## 2023-01-13 DIAGNOSIS — K580 Irritable bowel syndrome with diarrhea: Secondary | ICD-10-CM | POA: Diagnosis not present

## 2023-01-13 LAB — FERRITIN: Ferritin: 22.9 ng/mL (ref 22.0–322.0)

## 2023-01-13 LAB — CBC
HCT: 39.7 % (ref 39.0–52.0)
Hemoglobin: 13.2 g/dL (ref 13.0–17.0)
MCHC: 33.2 g/dL (ref 30.0–36.0)
MCV: 93.8 fl (ref 78.0–100.0)
Platelets: 236 10*3/uL (ref 150.0–400.0)
RBC: 4.23 Mil/uL (ref 4.22–5.81)
RDW: 14.7 % (ref 11.5–15.5)
WBC: 6.7 10*3/uL (ref 4.0–10.5)

## 2023-01-13 NOTE — Progress Notes (Signed)
Daniel Sullivan 72 y.o. 07-20-1951 [id number]  Assessment & Plan:   Encounter Diagnoses  Name Primary?   Iron deficiency anemia due to chronic blood loss Yes   Angiodysplasia of intestine    Irritable bowel syndrome with diarrhea     Overall doing well.  Plan is to continue iron supplementation and reserve enteroscopy and ablation of angiodysplasia if hemorrhage or failure to respond to iron.  Continue Imodium AD     Subjective:   Gastroenterology summary:  IBS-D followed in [facility] since 2011, some history of parasitic and enteric infections with travel to [place] for work in the past, eventually settled in on using daily Imodium to control symptoms.  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  Colonoscopy April 2021 normal and EGD 10/21/2022 gastritis and a single duodenal AVM   Chief Complaint: Follow-up of iron deficiency anemia and IBS  HPI 72 year old white man with a history of IBS-D, iron deficiency anemia and angiodysplasia of the upper intestine who returns for follow-up.  Also has GERD.  Overall he feels like he is doing well as long as he takes his Imodium at night his diarrhea is under control.  Recently returned from a  vacation to [place].   No Known Allergies Current Meds  Medication Sig   amLODipine (NORVASC) 5 MG tablet TAKE 1 TABLET BY MOUTH EVERY DAY   fluticasone (FLONASE) 50 MCG/ACT nasal spray Place 2 sprays into both nostrils daily. (Patient taking differently: Place 2 sprays into both nostrils as needed.)   Glucosamine-Chondroit-Vit C-Mn TABS Take 1 tablet by mouth daily.   losartan (COZAAR) 50 MG tablet TAKE 1 TABLET BY MOUTH EVERY DAY   Multiple Vitamins-Minerals (MENS MULTI VITAMIN & MINERAL PO) Take 1 tablet by mouth daily.   omeprazole (PRILOSEC) 20 MG capsule TAKE 1 CAPSULE BY MOUTH  EVERY DAY   pramipexole (MIRAPEX) 0.25 MG tablet Take 1 tablet (0.25 mg total) by mouth at bedtime.   pravastatin (PRAVACHOL) 40 MG tablet TAKE 1 TABLET BY MOUTH EVERY DAY   sildenafil (VIAGRA) 100 MG tablet TAKE 1/2 - 1 TABLET BY MOUTH DAILY AS NEEDED FOR ERECTILE DYSFUNCTION   SODIUM FLUORIDE 5000 PPM 1.1 % CREA dental cream Take by mouth as directed.   vitamin C (ASCORBIC ACID) 500 MG tablet Take 500 mg by mouth daily.   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   Diverticulosis    Elevated PSA    GERD (gastroesophageal reflux disease)    Hemorrhoids    History of blood clots    Hx of adenomatous colonic polyps    Hyperlipidemia    Hypertension    Hypogonadism in male    Past Surgical History:  Procedure Laterality Date   ANAL RECTAL MANOMETRY N/A 09/07/2017   Procedure: ANO RECTAL MANOMETRY;  Surgeon: Mauri Pole, MD;  [facility];  Service: Endoscopy;  Laterality: N/A;   COLONOSCOPY W/ BIOPSIES     ESOPHAGOGASTRODUODENOSCOPY     Multiple   GIVENS CAPSULE STUDY  05/13/2021   HEMORRHOID BANDING     Social History   Social History Narrative   Married, retired   Former smoker   + [place], no drugs   family history includes Alcohol  abuse in his father; Melanoma (age of onset: 61) in his brother; Suicidality in his father.   Review of Systems As per HPI  Objective:   Physical Exam BP 138/80 (BP Location: Right Arm)   Pulse 83   Ht '5\' 9"'$  (1.753 m)   Wt 179 lb 4 oz (81.3 kg)   SpO2 98%   BMI 26.47 kg/m

## 2023-01-13 NOTE — Patient Instructions (Signed)
Glad things are going well.  Keep using iron pills and Imodium.  If you get bloody stools or jet black stools let me know or go to hospital if emergency.  Checking blood count and iron today and I will let you know.  I appreciate the opportunity to care for you. Gatha Mayer, MD, [facility]

## 2023-01-14 ENCOUNTER — Other Ambulatory Visit: Payer: Self-pay | Admitting: Internal Medicine

## 2023-01-14 DIAGNOSIS — D500 Iron deficiency anemia secondary to blood loss (chronic): Secondary | ICD-10-CM

## 2023-01-27 ENCOUNTER — Encounter: Payer: Self-pay | Admitting: Podiatry

## 2023-01-27 ENCOUNTER — Ambulatory Visit: Payer: Medicare HMO | Admitting: Podiatry

## 2023-01-27 DIAGNOSIS — D225 Melanocytic nevi of trunk: Secondary | ICD-10-CM | POA: Diagnosis not present

## 2023-01-27 DIAGNOSIS — B351 Tinea unguium: Secondary | ICD-10-CM

## 2023-01-27 DIAGNOSIS — Z08 Encounter for follow-up examination after completed treatment for malignant neoplasm: Secondary | ICD-10-CM | POA: Diagnosis not present

## 2023-01-27 DIAGNOSIS — L821 Other seborrheic keratosis: Secondary | ICD-10-CM | POA: Diagnosis not present

## 2023-01-27 DIAGNOSIS — Z85828 Personal history of other malignant neoplasm of skin: Secondary | ICD-10-CM | POA: Diagnosis not present

## 2023-01-27 DIAGNOSIS — D485 Neoplasm of uncertain behavior of skin: Secondary | ICD-10-CM | POA: Diagnosis not present

## 2023-01-27 DIAGNOSIS — Z4802 Encounter for removal of sutures: Secondary | ICD-10-CM | POA: Diagnosis not present

## 2023-01-27 DIAGNOSIS — L570 Actinic keratosis: Secondary | ICD-10-CM | POA: Diagnosis not present

## 2023-01-27 DIAGNOSIS — M79674 Pain in right toe(s): Secondary | ICD-10-CM

## 2023-01-27 DIAGNOSIS — C44722 Squamous cell carcinoma of skin of right lower limb, including hip: Secondary | ICD-10-CM | POA: Diagnosis not present

## 2023-01-27 DIAGNOSIS — M79675 Pain in left toe(s): Secondary | ICD-10-CM | POA: Diagnosis not present

## 2023-01-27 DIAGNOSIS — L814 Other melanin hyperpigmentation: Secondary | ICD-10-CM | POA: Diagnosis not present

## 2023-01-27 DIAGNOSIS — Z09 Encounter for follow-up examination after completed treatment for conditions other than malignant neoplasm: Secondary | ICD-10-CM | POA: Diagnosis not present

## 2023-01-27 NOTE — Progress Notes (Signed)
Subjective:   Patient ID: Daniel Sullivan, male   DOB: 72 y.o.   MRN: [id number]   HPI Patient presents with elongated nailbeds 1-5 both feet that are painful and he cannot cut   ROS      Objective:  Physical Exam  Neurovascular status intact thick yellow brittle nailbeds 1-5 both feet painful chronic mycotic nail in the     Assessment:  Chronic mycotic nails with pain 1-5 both feet     Plan:  Debrided painful nailbeds 1-5 both feet no angiogenic bleeding reappoint for routine care

## 2023-02-21 ENCOUNTER — Telehealth: Payer: Self-pay | Admitting: Family Medicine

## 2023-02-21 NOTE — Telephone Encounter (Signed)
Called patient to schedule Medicare Annual Wellness Visit (AWV). Left message for patient to call back and schedule Medicare Annual Wellness Visit (AWV).  Last date of AWV: due awvi 02/04/16 per [facility]   Please schedule an appointment at any time with [facility] or [facility].  If any questions, please contact me at [phone].  Thank you ,  Barkley Boards AWV direct phone # [phone]

## 2023-02-22 ENCOUNTER — Encounter: Payer: Self-pay | Admitting: Family Medicine

## 2023-02-23 DIAGNOSIS — D0472 Carcinoma in situ of skin of left lower limb, including hip: Secondary | ICD-10-CM | POA: Diagnosis not present

## 2023-02-23 DIAGNOSIS — C44722 Squamous cell carcinoma of skin of right lower limb, including hip: Secondary | ICD-10-CM | POA: Diagnosis not present

## 2023-02-23 DIAGNOSIS — D485 Neoplasm of uncertain behavior of skin: Secondary | ICD-10-CM | POA: Diagnosis not present

## 2023-02-23 DIAGNOSIS — C4442 Squamous cell carcinoma of skin of scalp and neck: Secondary | ICD-10-CM | POA: Diagnosis not present

## 2023-02-28 ENCOUNTER — Telehealth: Payer: Self-pay | Admitting: Family Medicine

## 2023-02-28 NOTE — Telephone Encounter (Signed)
Asking that someone call his regarding findings reflecting patient's carotid atherosclerosis and plaques.

## 2023-03-02 NOTE — Telephone Encounter (Signed)
There is nothing in his chart about this subject. Did he have carotid US recently?

## 2023-03-03 NOTE — Telephone Encounter (Signed)
I spoke with a receptionist at [facility] and she ststaed she would leave a message for Dr. Redmond Pulling in regards to the message below and have someone reach back out to our office.

## 2023-03-15 DIAGNOSIS — C4442 Squamous cell carcinoma of skin of scalp and neck: Secondary | ICD-10-CM | POA: Diagnosis not present

## 2023-03-15 DIAGNOSIS — D485 Neoplasm of uncertain behavior of skin: Secondary | ICD-10-CM | POA: Diagnosis not present

## 2023-03-15 DIAGNOSIS — S81801A Unspecified open wound, right lower leg, initial encounter: Secondary | ICD-10-CM | POA: Diagnosis not present

## 2023-03-15 DIAGNOSIS — C44722 Squamous cell carcinoma of skin of right lower limb, including hip: Secondary | ICD-10-CM | POA: Diagnosis not present

## 2023-03-22 ENCOUNTER — Other Ambulatory Visit: Payer: Self-pay | Admitting: Family Medicine

## 2023-03-25 ENCOUNTER — Telehealth: Payer: Self-pay | Admitting: Family Medicine

## 2023-03-25 DIAGNOSIS — R42 Dizziness and giddiness: Secondary | ICD-10-CM | POA: Diagnosis not present

## 2023-03-25 NOTE — Telephone Encounter (Signed)
I spoke with the patient and he reported he will be going to urgent care for treatment of his symptoms.

## 2023-03-25 NOTE — Telephone Encounter (Signed)
Pt call back and stated he is going to the Urgent Care.

## 2023-03-25 NOTE — Telephone Encounter (Signed)
Pt is having SOB and vertigo and refused to go to ER per triage nurse deborah. Pt refused

## 2023-03-31 ENCOUNTER — Ambulatory Visit: Payer: Medicare HMO | Admitting: Podiatry

## 2023-04-05 DIAGNOSIS — D0471 Carcinoma in situ of skin of right lower limb, including hip: Secondary | ICD-10-CM | POA: Diagnosis not present

## 2023-04-05 DIAGNOSIS — D485 Neoplasm of uncertain behavior of skin: Secondary | ICD-10-CM | POA: Diagnosis not present

## 2023-04-05 DIAGNOSIS — C44722 Squamous cell carcinoma of skin of right lower limb, including hip: Secondary | ICD-10-CM | POA: Diagnosis not present

## 2023-04-06 ENCOUNTER — Encounter: Payer: Self-pay | Admitting: Family Medicine

## 2023-04-06 ENCOUNTER — Ambulatory Visit (INDEPENDENT_AMBULATORY_CARE_PROVIDER_SITE_OTHER): Payer: Medicare HMO | Admitting: Family Medicine

## 2023-04-06 ENCOUNTER — Ambulatory Visit (INDEPENDENT_AMBULATORY_CARE_PROVIDER_SITE_OTHER): Payer: Medicare HMO

## 2023-04-06 VITALS — BP 138/96 | HR 78 | Temp 98.5°F | Wt 175.8 lb

## 2023-04-06 DIAGNOSIS — R42 Dizziness and giddiness: Secondary | ICD-10-CM

## 2023-04-06 DIAGNOSIS — R0602 Shortness of breath: Secondary | ICD-10-CM

## 2023-04-06 DIAGNOSIS — C44722 Squamous cell carcinoma of skin of right lower limb, including hip: Secondary | ICD-10-CM

## 2023-04-06 DIAGNOSIS — Z87891 Personal history of nicotine dependence: Secondary | ICD-10-CM

## 2023-04-06 DIAGNOSIS — J90 Pleural effusion, not elsewhere classified: Secondary | ICD-10-CM | POA: Diagnosis not present

## 2023-04-06 NOTE — Progress Notes (Addendum)
Established Patient Office Visit   Subjective  Patient ID: Daniel Sullivan, male    DOB: [birth year]  Age: 72 y.o. MRN: [id number]  Chief Complaint  Patient presents with   lung capacity    Wife passed away from lung cancer. Gets dizzy/ lightheaded when hiking, sometimes SOB but not as much as the dizziness. Has seen pcp for the concern, but not much done     Patient is a 72 year old male with pmh sig for HTN, history of DVT, allergies, GERD, IBS, HLD, pre-DM, vitamin B12 deficiency, iron deficiency anemia who was followed by Dr. Caryl Never and seen for ongoing concern.  Patient endorses intermittent dizziness/lightheadedness x 1 year.  Most recent episode of dizziness and SOB occurred during a strenuous hike.  Patient concerned about lung cancer as his wife died 4 years ago from the illness.  Patient is a former smoker, smoked half a pack x 10-15 years.  Pt stays active by riding his bike, hiking, and other outdoor activities.  Patient notes being sedentary for 2 weeks due to having a squamous cell removed from RLE.  Has another skin surgery scheduled for 5/21.    Patient Active Problem List   Diagnosis Date Noted   Vitamin B 12 deficiency 09/16/2022   Acute deep vein thrombosis (DVT) of right femoral vein (HCC) 04/09/2022   IBS (irritable bowel syndrome) 01/09/2019   Hemorrhoids with complication 01/02/2018   Incontinence of feces with fecal urgency    Iron deficiency anemia 08/21/2014   Prediabetes 03/26/2014   Onychomycosis 12/28/2013   Pain, foot 12/28/2013   Metatarsalgia of both feet 06/22/2013   Metatarsal deformity 06/22/2013   ELEVATED PROSTATE SPECIFIC ANTIGEN 09/18/2010   Low testosterone 08/17/2010   Hyperlipidemia 08/17/2010   Essential hypertension 08/17/2010   ALLERGIC RHINITIS 08/17/2010   GERD 08/17/2010   Past Surgical History:  Procedure Laterality Date   ANAL RECTAL MANOMETRY N/A 09/07/2017   Procedure: ANO RECTAL MANOMETRY;  Surgeon: Napoleon Form, MD;   Location: [facility];  Service: Endoscopy;  Laterality: N/A;   COLONOSCOPY W/ BIOPSIES     ESOPHAGOGASTRODUODENOSCOPY     Multiple   GIVENS CAPSULE STUDY  05/13/2021   HEMORRHOID BANDING     Social History   Tobacco Use   Smoking status: Former    Packs/day: 0.50    Years: 15.00    Additional pack years: 0.00    Total pack years: 7.50    Types: Cigarettes    Quit date: 02/24/1988    Years since quitting: 35.1   Smokeless tobacco: Never  Vaping Use   Vaping Use: Never used  Substance Use Topics   Alcohol use: Yes    Comment: occasionally   Drug use: No   Family History  Problem Relation Age of Onset   Alcohol abuse Father    Suicidality Father    Melanoma Brother 57   Stomach cancer Neg Hx    Colon cancer Neg Hx    Rectal cancer Neg Hx    Esophageal cancer Neg Hx    No Known Allergies    ROS Negative unless stated above    Objective:     BP (!) 138/96 (BP Location: Right Arm, Patient Position: Sitting, Cuff Size: Normal)   Pulse 78   Temp 98.5 F (36.9 C) (Oral)   Wt 175 lb 12.8 oz (79.7 kg)   SpO2 95%   BMI 25.96 kg/m    Physical Exam Constitutional:      General: He is not  in acute distress.    Appearance: Normal appearance.  HENT:     Head: Normocephalic and atraumatic.     Comments: Hearing aids in place.    Nose: Nose normal.     Mouth/Throat:     Mouth: Mucous membranes are moist.  Eyes:     Extraocular Movements: Extraocular movements intact.     Conjunctiva/sclera: Conjunctivae normal.  Neck:     Vascular: No carotid bruit.  Cardiovascular:     Rate and Rhythm: Normal rate and regular rhythm.     Heart sounds: Normal heart sounds. No murmur heard.    No gallop.  Pulmonary:     Effort: Pulmonary effort is normal. No respiratory distress.     Breath sounds: Normal breath sounds. No wheezing, rhonchi or rales.  Skin:    General: Skin is warm and dry.  Neurological:     Mental Status: He is alert and oriented to person, place, and  time.  Psychiatric:        Mood and Affect: Mood normal.        Behavior: Behavior normal.       04/06/2023    1:44 PM 11/10/2021    9:38 AM 12/04/2019   10:41 AM  Depression screen PHQ 2/9  Decreased Interest 0 0 0  Down, Depressed, Hopeless 0 0 0  PHQ - 2 Score 0 0 0  Altered sleeping 0  0  Tired, decreased energy 0  0  Change in appetite 0  0  Feeling bad or failure about yourself  0  0  Trouble concentrating 0  0  Moving slowly or fidgety/restless 0  0  Suicidal thoughts 0  0  PHQ-9 Score 0  0  Difficult doing work/chores   Not difficult at all      04/06/2023    1:44 PM  GAD 7 : Generalized Anxiety Score  Nervous, Anxious, on Edge 0  Control/stop worrying 0  Worry too much - different things 0  Trouble relaxing 0  Restless 0  Easily annoyed or irritable 0  Afraid - awful might happen 0  Total GAD 7 Score 0     No results found for any visits on 04/06/23.    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  Anxiety may also be contributing.  Continue increasing activity as tolerated.  Obtain CXR.  Return if symptoms worsen or fail to improve.   Deeann Saint, MD

## 2023-04-06 NOTE — Patient Instructions (Signed)
We obtained a chest x-ray at today's visit.  If you continue to have symptoms of dizziness and shortness of breath we can always place a referral to send you to a pulmonologist for further evaluation.

## 2023-04-07 ENCOUNTER — Ambulatory Visit: Payer: Medicare HMO | Admitting: Podiatry

## 2023-04-07 ENCOUNTER — Encounter: Payer: Self-pay | Admitting: Podiatry

## 2023-04-07 DIAGNOSIS — M79674 Pain in right toe(s): Secondary | ICD-10-CM | POA: Diagnosis not present

## 2023-04-07 DIAGNOSIS — M79675 Pain in left toe(s): Secondary | ICD-10-CM

## 2023-04-07 DIAGNOSIS — B351 Tinea unguium: Secondary | ICD-10-CM | POA: Diagnosis not present

## 2023-04-07 NOTE — Progress Notes (Signed)
Subjective:   Patient ID: Daniel Sullivan, male   DOB: 72 y.o.   MRN: [id number]   HPI Patient presents with elongated nailbeds 1-5 both feet thick and bothersome and he cannot cut   ROS      Objective:  Physical Exam  Neurovascular status intact thick yellow brittle nailbeds 1-5 both feet painful     Assessment:  Mycotic nail infections 1-5 both feet that are painful     Plan:  Debridement nailbeds 1-5 both feet no iatrogenic bleeding reappoint routine care

## 2023-04-11 ENCOUNTER — Other Ambulatory Visit: Payer: Self-pay | Admitting: Family Medicine

## 2023-04-11 ENCOUNTER — Telehealth: Payer: Self-pay | Admitting: Family Medicine

## 2023-04-11 DIAGNOSIS — R42 Dizziness and giddiness: Secondary | ICD-10-CM

## 2023-04-11 DIAGNOSIS — J90 Pleural effusion, not elsewhere classified: Secondary | ICD-10-CM

## 2023-04-11 DIAGNOSIS — Z87891 Personal history of nicotine dependence: Secondary | ICD-10-CM

## 2023-04-11 DIAGNOSIS — R0602 Shortness of breath: Secondary | ICD-10-CM

## 2023-04-11 NOTE — Telephone Encounter (Signed)
Called patient to schedule Medicare Annual Wellness Visit (AWV). Left message for patient to call back and schedule Medicare Annual Wellness Visit (AWV).  Last date of AWV: AWVI 02/04/16 per pa*[facility]   Please schedule an appointment at any time with [facility] or [facility].  If any questions, please contact me at [phone].  Thank you ,  Rudell Cobb AWV direct phone # [phone]

## 2023-04-14 ENCOUNTER — Encounter: Payer: Self-pay | Admitting: Internal Medicine

## 2023-04-14 ENCOUNTER — Ambulatory Visit: Payer: Medicare HMO | Admitting: Internal Medicine

## 2023-04-14 VITALS — BP 132/74 | HR 87 | Ht 69.5 in | Wt 176.0 lb

## 2023-04-14 DIAGNOSIS — R0602 Shortness of breath: Secondary | ICD-10-CM

## 2023-04-14 MED ORDER — ALBUTEROL SULFATE HFA 108 (90 BASE) MCG/ACT IN AERS: 2.0000 | INHALATION_SPRAY | Freq: Four times a day (QID) | RESPIRATORY_TRACT | 2 refills | Status: DC | PRN

## 2023-04-14 NOTE — Progress Notes (Signed)
MADAN HANNEL    [id number]    [birth year]  Primary Care Physician:Burchette, Elberta Fortis, MD  Referring Physician: Deeann Saint, MD [address] [place],  [place] [address] Reason for Consultation: shortness of breath Date of Consultation: 04/14/2023  Chief complaint:   Chief Complaint  Patient presents with   Consult    Review of CXR      HPI: Daniel Sullivan is a 72 y.o. man who presents for new patient evaluation for abnormal chest xray and shortness of breath.Shortness of breath with exertion for the last few weeks. No coughing or wheezing, or chest tightness.   Had pneumonia once many years ago. Wife had lung cancer. He did have childhood asthma but nothing as an adult.   Normally he is able to keep up with ADLs. He is limited right now due to skin cancer resection in his right leg.  He has a flonase spray he uses in the fall during ragweed season.   He does occasional smoke hemp in a pipe. Occasional vaping.  Social history:  Occupation: retired, used to work for [employer] and [employer].  Exposures: lives at home with partner.  Smoking history: former smoker <10 pack years quit in 1989. Passive smoke exposure in childhood.   Social History   Occupational History   Occupation: retired  Tobacco Use   Smoking status: Former    Packs/day: 0.50    Years: 18.00    Additional pack years: 0.00    Total pack years: 9.00    Types: Cigarettes    Quit date: 02/24/1988    Years since quitting: 35.1   Smokeless tobacco: Never  Vaping Use   Vaping Use: Never used  Substance and Sexual Activity   Alcohol use: Yes    Comment: occasionally   Drug use: No   Sexual activity: Yes    Partners: Female    Relevant family history:  Family History  Problem Relation Age of Onset   Alcohol abuse Father    Suicidality Father    Emphysema Father    Melanoma Brother 34   Stomach cancer Neg Hx    Colon cancer Neg Hx    Rectal cancer Neg Hx    Esophageal cancer  Neg Hx     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   Diverticulosis    Elevated PSA    GERD (gastroesophageal reflux disease)    Hemorrhoids    History of blood clots    Hx of adenomatous colonic polyps    Hyperlipidemia    Hypertension    Hypogonadism in male     Past Surgical History:  Procedure Laterality Date   ANAL RECTAL MANOMETRY N/A 09/07/2017   Procedure: ANO RECTAL MANOMETRY;  Surgeon: Napoleon Form, MD;  Location: [facility];  Service: Endoscopy;  Laterality: N/A;   COLONOSCOPY W/ BIOPSIES     ESOPHAGOGASTRODUODENOSCOPY     Multiple   GIVENS CAPSULE STUDY  05/13/2021   HEMORRHOID BANDING       Physical Exam: Blood pressure 132/74, pulse 87, height 5' 9.5" (1.765 m), weight 176 lb (79.8 kg), SpO2 96 %. Gen:      No acute distress ENT:  +nasal debris, no nasal polyps, mucus membranes moist Lungs:    No increased respiratory effort, symmetric chest wall  excursion, clear to auscultation bilaterally, no wheezes or crackles CV:         Regular rate and rhythm; no murmurs, rubs, or gallops.  No pedal edema Abd:      + bowel sounds; soft, non-tender; no distension MSK: RLE in bandage s/p skin cancer excision Skin:      Warm and dry; no rashes Neuro: normal speech, no focal facial asymmetry Psych: alert and oriented x3, normal mood and affect   Data Reviewed/Medical Decision Making:  Independent interpretation of tests: Imaging:  Review of patient's chest xray images 04/10/23 revealed no acute process, possible left sided atelectasis vs effusion. The patient's images have been independently reviewed by me.    PFTs:    Labs:  Lab Results  Component Value Date   NA 138 07/28/2022   K 4.5 07/28/2022   CO2 29 07/28/2022   GLUCOSE 93 07/28/2022   BUN 16 07/28/2022   CREATININE 0.93 07/28/2022   CALCIUM 8.6 07/28/2022   GFRNONAA >60 03/29/2022   Lab Results   Component Value Date   WBC 6.7 01/13/2023   HGB 13.2 01/13/2023   HCT 39.7 01/13/2023   MCV 93.8 01/13/2023   PLT 236.0 01/13/2023     Immunization status:  Immunization History  Administered Date(s) Administered   Fluad Quad(high Dose 65+) 08/06/2021, 08/24/2022   Influenza Whole 09/18/2010   Influenza, High Dose Seasonal PF 09/06/2017, 07/26/2019   Influenza,inj,Quad PF,6+ Mos 08/23/2013   Influenza-Unspecified 07/14/2018, 07/26/2019   Moderna Sars-Covid-2 Vaccination 01/08/2020, 02/07/2020   Pneumococcal Conjugate-13 04/01/2015   Pneumococcal Polysaccharide-23 03/09/2016   Td 12/06/2009   Tdap 02/01/2022   Zoster Recombinat (Shingrix) 08/03/2018, 02/29/2020   Zoster, Live 04/08/2015     I reviewed prior external note(s) from primary care  I reviewed the result(s) of the labs and imaging as noted above.   I have ordered spiro and feno  Discussion of management or test interpretation with another colleague  Assessment:  Shortness of breath  Childhood asthma Seasonal allergic rhinitis  Plan/Recommendations:  Trial of albuterol prn spirometry and feno at next visit.   We discussed disease management and progression at length today.   Return to Care: Return in about 2 months (around 06/14/2023).  Durel Salts, MD Pulmonary and Critical Care Medicine [facility] [phone]  CC: Deeann Saint, MD

## 2023-04-14 NOTE — Patient Instructions (Addendum)
Please schedule follow up scheduled with myself in 2 months.  If my schedule is not open yet, we will contact you with a reminder closer to that time. Please call [phone] if you haven't heard from [place] a month before.   You could have symptoms of asthma. Let's try an albuterol inhaler and see if this helps.    Take the albuterol rescue inhaler every 4 to 6 hours as needed for wheezing or shortness of breath. You can also take it 15 minutes before exercise or exertional activity. Side effects include heart racing or pounding, jitters or anxiety. If you have a history of an irregular heart rhythm, it can make this worse. Can also give some patients a hard time sleeping.  By learning about asthma and how it can be controlled, you take an important step toward managing this disease. Work closely with your asthma care team to learn all you can about your asthma, how to avoid triggers, what your medications do, and how to take them correctly. With proper care, you can live free of asthma symptoms and maintain a normal, healthy lifestyle.   What is asthma? Asthma is a chronic disease that affects the airways of the lungs. During normal breathing, the bands of muscle that surround the airways are relaxed and air moves freely. During an asthma episode or "attack," there are three main changes that stop air from moving easily through the airways: The bands of muscle that surround the airways tighten and make the airways narrow. This tightening is called bronchospasm.  The lining of the airways becomes swollen or inflamed.  The cells that line the airways produce more mucus, which is thicker than normal and clogs the airways.  These three factors - bronchospasm, inflammation, and mucus production - cause symptoms such as difficulty breathing, wheezing, and coughing.  What are the most common symptoms of asthma? Asthma symptoms are not the same for everyone. They can even change from episode to episode  in the same person. Also, you may have only one symptom of asthma, such as cough, but another person may have all the symptoms of asthma. It is important to know all the symptoms of asthma and to be aware that your asthma can present in any of these ways at any time. The most common symptoms include: Coughing, especially at night  Shortness of breath  Wheezing  Chest tightness, pain, or pressure   Who is affected by asthma? Asthma affects 22 million Americans; about 6 million of these are children under age 16. People who have a family history of asthma have an increased risk of developing the disease. Asthma is also more common in people who have allergies or who are exposed to tobacco smoke. However, anyone can develop asthma at any time. Some people may have asthma all of their lives, while others may develop it as adults.  What causes asthma? The airways in a person with asthma are very sensitive and react to many things, or "triggers." Contact with these triggers causes asthma symptoms. One of the most important parts of asthma control is to identify your triggers and then avoid them when possible. The only trigger you do not want to avoid is exercise. Pre-treatment with medicines before exercise can allow you to stay active yet avoid asthma symptoms. Common asthma triggers include: Infections (colds, viruses, flu, sinus infections)  Exercise  Weather (changes in temperature and/or humidity, cold air)  Tobacco smoke  Allergens (dust mites, pollens, pets, mold spores, cockroaches,  and sometimes foods)  Irritants (strong odors from cleaning products, perfume, wood smoke, air pollution)  Strong emotions such as crying or laughing hard  Some medications   How is asthma diagnosed? To diagnose asthma, your doctor will first review your medical history, family history, and symptoms. Your doctor will want to know any past history of breathing problems you may have had, as well as a family history  of asthma, allergies, eczema (a bumpy, itchy skin rash caused by allergies), or other lung disease. It is important that you describe your symptoms in detail (cough, wheeze, shortness of breath, chest tightness), including when and how often they occur. The doctor will perform a physical examination and listen to your heart and lungs. He or she may also order breathing tests, allergy tests, blood tests, and chest and sinus X-rays. The tests will find out if you do have asthma and if there are any other conditions that are contributing factors.  How is asthma treated? Asthma can be controlled, but not cured. It is not normal to have frequent symptoms, trouble sleeping, or trouble completing tasks. Appropriate asthma care will prevent symptoms and visits to the emergency room and hospital. Asthma medicines are one of the mainstays of asthma treatment. The drugs used to treat asthma are explained below.  Anti-inflammatories: These are the most important drugs for most people with asthma. Anti-inflammatory drugs reduce swelling and mucus production in the airways. As a result, airways are less sensitive and less likely to react to triggers. These medications need to be taken daily and may need to be taken for several weeks before they begin to control asthma. Anti-inflammatory medicines lead to fewer symptoms, better airflow, less sensitive airways, less airway damage, and fewer asthma attacks. If taken every day, they CONTROL or prevent asthma symptoms.   Bronchodilators: These drugs relax the muscle bands that tighten around the airways. This action opens the airways, letting more air in and out of the lungs and improving breathing. Bronchodilators also help clear mucus from the lungs. As the airways open, the mucus moves more freely and can be coughed out more easily. In short-acting forms, bronchodilators RELIEVE or stop asthma symptoms by quickly opening the airways and are very helpful during an asthma  episode. In long-acting forms, bronchodilators provide CONTROL of asthma symptoms and prevent asthma episodes.  Asthma drugs can be taken in a variety of ways. Inhaling the medications by using a metered dose inhaler, dry powder inhaler, or nebulizer is one way of taking asthma medicines. Oral medicines (pills or liquids you swallow) may also be prescribed.  Asthma severity Asthma is classified as either "intermittent" (comes and goes) or "persistent" (lasting). Persistent asthma is further described as being mild, moderate, or severe. The severity of asthma is based on how often you have symptoms both during the day and night, as well as by the results of lung function tests and by how well you can perform activities. The "severity" of asthma refers to how "intense" or "strong" your asthma is.  Asthma control Asthma control is the goal of asthma treatment. Regardless of your asthma severity, it may or may not be controlled. Asthma control means: You are able to do everything you want to do at work and home  You have no (or minimal) asthma symptoms  You do not wake up from your sleep or earlier than usual in the morning due to asthma  You rarely need to use your reliever medicine (inhaler)  Another major part of  your treatment is that you are happy with your asthma care and believe your asthma is controlled.  Monitoring symptoms A key part of treatment is keeping track of how well your lungs are working. Monitoring your symptoms, what they are, how and when they happen, and how severe they are, is an important part of being able to control your asthma.  Sometimes asthma is monitored using a peak flow meter. A peak flow (PF) meter measures how fast the air comes out of your lungs. It can help you know when your asthma is getting worse, sometimes even before you have symptoms. By taking daily peak flow readings, you can learn when to adjust medications to keep asthma under good control. It is also used  to create your asthma action plan (see below). Your doctor can use your peak flow readings to adjust your treatment plan in some cases.  Asthma Action Plan Based on your history and asthma severity, you and your doctor will develop a care plan called an "asthma action plan." The asthma action plan describes when and how to use your medicines, actions to take when asthma worsens, and when to seek emergency care. Make sure you understand this plan. If you do not, ask your asthma care provider any questions you may have. Your asthma action plan is one of the keys to controlling asthma. Keep it readily available to remind you of what you need to do every day to control asthma and what you need to do when symptoms occur.  Goals of asthma therapy These are the goals of asthma treatment: Live an active, normal life  Prevent chronic and troublesome symptoms  Attend work or school every day  Perform daily activities without difficulty  Stop urgent visits to the doctor, emergency department, or hospital  Use and adjust medications to control asthma with few or no side effects

## 2023-04-19 DIAGNOSIS — Z4802 Encounter for removal of sutures: Secondary | ICD-10-CM | POA: Diagnosis not present

## 2023-04-19 DIAGNOSIS — Z4801 Encounter for change or removal of surgical wound dressing: Secondary | ICD-10-CM | POA: Diagnosis not present

## 2023-04-19 DIAGNOSIS — Z48817 Encounter for surgical aftercare following surgery on the skin and subcutaneous tissue: Secondary | ICD-10-CM | POA: Diagnosis not present

## 2023-05-05 DIAGNOSIS — C44722 Squamous cell carcinoma of skin of right lower limb, including hip: Secondary | ICD-10-CM | POA: Diagnosis not present

## 2023-05-05 DIAGNOSIS — I872 Venous insufficiency (chronic) (peripheral): Secondary | ICD-10-CM | POA: Diagnosis not present

## 2023-05-26 ENCOUNTER — Other Ambulatory Visit: Payer: Medicare HMO

## 2023-06-06 ENCOUNTER — Other Ambulatory Visit: Payer: Medicare HMO

## 2023-06-06 ENCOUNTER — Other Ambulatory Visit: Payer: Self-pay

## 2023-06-06 DIAGNOSIS — R972 Elevated prostate specific antigen [PSA]: Secondary | ICD-10-CM

## 2023-06-07 ENCOUNTER — Other Ambulatory Visit: Payer: Medicare HMO

## 2023-06-08 ENCOUNTER — Ambulatory Visit: Payer: Medicare HMO | Admitting: Urology

## 2023-06-14 ENCOUNTER — Other Ambulatory Visit: Payer: Medicare HMO

## 2023-06-14 DIAGNOSIS — R972 Elevated prostate specific antigen [PSA]: Secondary | ICD-10-CM | POA: Diagnosis not present

## 2023-06-15 ENCOUNTER — Encounter: Payer: Self-pay | Admitting: Urology

## 2023-06-15 DIAGNOSIS — R972 Elevated prostate specific antigen [PSA]: Secondary | ICD-10-CM

## 2023-06-15 LAB — PSA: Prostate Specific Ag, Serum: 9.8 ng/mL — ABNORMAL HIGH (ref 0.0–4.0)

## 2023-06-16 DIAGNOSIS — C44722 Squamous cell carcinoma of skin of right lower limb, including hip: Secondary | ICD-10-CM | POA: Diagnosis not present

## 2023-06-16 DIAGNOSIS — D485 Neoplasm of uncertain behavior of skin: Secondary | ICD-10-CM | POA: Diagnosis not present

## 2023-06-16 DIAGNOSIS — I872 Venous insufficiency (chronic) (peripheral): Secondary | ICD-10-CM | POA: Diagnosis not present

## 2023-06-20 ENCOUNTER — Other Ambulatory Visit: Payer: Self-pay | Admitting: Family Medicine

## 2023-06-22 ENCOUNTER — Encounter: Payer: Self-pay | Admitting: Internal Medicine

## 2023-06-23 ENCOUNTER — Other Ambulatory Visit: Payer: Self-pay

## 2023-06-23 MED ORDER — SILDENAFIL CITRATE 100 MG PO TABS: ORAL_TABLET | ORAL | 1 refills | Status: DC

## 2023-06-27 ENCOUNTER — Ambulatory Visit: Payer: Medicare HMO | Admitting: Internal Medicine

## 2023-06-27 ENCOUNTER — Telehealth: Payer: Self-pay | Admitting: Family Medicine

## 2023-06-27 NOTE — Telephone Encounter (Signed)
Pt called very upset stating he is still waiting for someone to call him to schedule an appointment, as we could not schedule him on Friday, due to the system outage.   When Pt was asked the reason for the visit, Pt stated he does not feel well.   Pt was asked for some of his symptoms and Pt yelled; IT DOESN'T MATTER!!  Pt was informed that we need to know his symptoms, in case he needs to be tested for Covid, etc....  Pt said I'll just call back later, and hung up.

## 2023-06-28 ENCOUNTER — Ambulatory Visit (INDEPENDENT_AMBULATORY_CARE_PROVIDER_SITE_OTHER): Payer: Medicare HMO | Admitting: Family Medicine

## 2023-06-28 ENCOUNTER — Encounter: Payer: Self-pay | Admitting: Family Medicine

## 2023-06-28 VITALS — BP 132/94 | HR 94 | Temp 98.5°F | Wt 178.0 lb

## 2023-06-28 DIAGNOSIS — R0602 Shortness of breath: Secondary | ICD-10-CM | POA: Diagnosis not present
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
## 2023-06-29 LAB — BASIC METABOLIC PANEL
BUN: 18 mg/dL (ref 6–23)
CO2: 27 mEq/L (ref 19–32)
Calcium: 9 mg/dL (ref 8.4–10.5)
Chloride: 98 mEq/L (ref 96–112)
Creatinine, Ser: 0.97 mg/dL (ref 0.40–1.50)
GFR: 77.52 mL/min (ref >60.00)
Glucose, Bld: 127 mg/dL — ABNORMAL HIGH (ref 70–99)
Potassium: 3.8 mEq/L (ref 3.5–5.1)
Sodium: 136 mEq/L (ref 135–145)

## 2023-07-05 ENCOUNTER — Ambulatory Visit: Payer: Medicare HMO | Admitting: Podiatry

## 2023-07-05 ENCOUNTER — Encounter: Payer: Self-pay | Admitting: Podiatry

## 2023-07-05 VITALS — BP 177/97 | HR 89

## 2023-07-05 DIAGNOSIS — B351 Tinea unguium: Secondary | ICD-10-CM | POA: Diagnosis not present

## 2023-07-05 DIAGNOSIS — M79674 Pain in right toe(s): Secondary | ICD-10-CM

## 2023-07-05 DIAGNOSIS — M79675 Pain in left toe(s): Secondary | ICD-10-CM

## 2023-07-05 NOTE — Progress Notes (Signed)
This patient presents to the office with chief complaint of long thick painful nails.  Patient says the nails are painful walking and wearing shoes.  This patient is unable to self treat.  This patient is unable to trim hisnails since she is unable to reach his nails.  he presents to the office for preventative foot care services.  General Appearance  Alert, conversant and in no acute stress.  Vascular  Dorsalis pedis and posterior tibial  pulses are palpable  bilaterally.  Capillary return is within normal limits  bilaterally. Temperature is within normal limits  bilaterally.  Neurologic  Senn-Weinstein monofilament wire test within normal limits  bilaterally. Muscle power within normal limits bilaterally.  Nails Thick disfigured discolored nails with subungual debris  from hallux to fifth toes bilaterally. No evidence of bacterial infection or drainage bilaterally.  Orthopedic  No limitations of motion  feet .  No crepitus or effusions noted.  No bony pathology or digital deformities noted.  Skin  normotropic skin with no porokeratosis noted bilaterally.  No signs of infections or ulcers noted.     Onychomycosis  Nails  B/L.  Pain in right toes  Pain in left toes  Debridement of nails both feet followed trimming the nails with dremel tool.    RTC 3 months.   Gardiner Barefoot DPM

## 2023-07-11 ENCOUNTER — Other Ambulatory Visit: Payer: Self-pay

## 2023-07-11 MED ORDER — OMEPRAZOLE 20 MG PO CPDR: DELAYED_RELEASE_CAPSULE | ORAL | 1 refills | Status: DC

## 2023-07-12 ENCOUNTER — Ambulatory Visit: Payer: Medicare HMO | Admitting: Podiatry

## 2023-07-19 DIAGNOSIS — D0471 Carcinoma in situ of skin of right lower limb, including hip: Secondary | ICD-10-CM | POA: Diagnosis not present

## 2023-07-20 ENCOUNTER — Other Ambulatory Visit: Payer: Self-pay | Admitting: Family Medicine

## 2023-07-21 ENCOUNTER — Ambulatory Visit
Admission: RE | Admit: 2023-07-21 | Discharge: 2023-07-21 | Disposition: A | Discharge status: Home / Self Care | Payer: Medicare HMO | Source: Ambulatory Visit | Attending: Family Medicine | Admitting: Family Medicine

## 2023-07-21 DIAGNOSIS — I700 Atherosclerosis of aorta: Secondary | ICD-10-CM | POA: Diagnosis not present

## 2023-07-21 DIAGNOSIS — J9811 Atelectasis: Secondary | ICD-10-CM | POA: Diagnosis not present

## 2023-07-21 DIAGNOSIS — R0602 Shortness of breath: Secondary | ICD-10-CM

## 2023-07-21 DIAGNOSIS — J90 Pleural effusion, not elsewhere classified: Secondary | ICD-10-CM | POA: Diagnosis not present

## 2023-07-21 MED ORDER — IOPAMIDOL (ISOVUE-300) INJECTION 61%
500.0000 mL | Freq: Once | INTRAVENOUS | Status: AC | PRN
  Administered 2023-07-21: 75 mL via INTRAVENOUS

## 2023-07-22 ENCOUNTER — Other Ambulatory Visit: Payer: Self-pay | Admitting: Family Medicine

## 2023-07-28 DIAGNOSIS — Z08 Encounter for follow-up examination after completed treatment for malignant neoplasm: Secondary | ICD-10-CM | POA: Diagnosis not present

## 2023-07-28 DIAGNOSIS — L821 Other seborrheic keratosis: Secondary | ICD-10-CM | POA: Diagnosis not present

## 2023-07-28 DIAGNOSIS — L814 Other melanin hyperpigmentation: Secondary | ICD-10-CM | POA: Diagnosis not present

## 2023-07-28 DIAGNOSIS — L570 Actinic keratosis: Secondary | ICD-10-CM | POA: Diagnosis not present

## 2023-07-28 DIAGNOSIS — Z86007 Personal history of in-situ neoplasm of skin: Secondary | ICD-10-CM | POA: Diagnosis not present

## 2023-07-30 ENCOUNTER — Encounter: Payer: Self-pay | Admitting: Internal Medicine

## 2023-07-30 ENCOUNTER — Encounter: Payer: Self-pay | Admitting: Family Medicine

## 2023-08-04 NOTE — Telephone Encounter (Signed)
Reviewed CT results with pt verbalized understanding

## 2023-08-11 ENCOUNTER — Other Ambulatory Visit: Payer: Self-pay | Admitting: Family Medicine

## 2023-08-11 ENCOUNTER — Other Ambulatory Visit: Payer: Self-pay | Admitting: Internal Medicine

## 2023-08-16 ENCOUNTER — Encounter: Payer: Self-pay | Admitting: Family Medicine

## 2023-09-11 IMAGING — DX DG RIBS W/ CHEST 3+V*L*
4 series · 4 of 4 positions shown · non-contrast
Comparison: Chest radiograph 05/30/2015

CLINICAL DATA: Fall last night. Left posterior and lateral rib
pain. Crepitus with palpitation.

EXAM:
LEFT RIBS AND CHEST - 3+ VIEW

[chest pa]
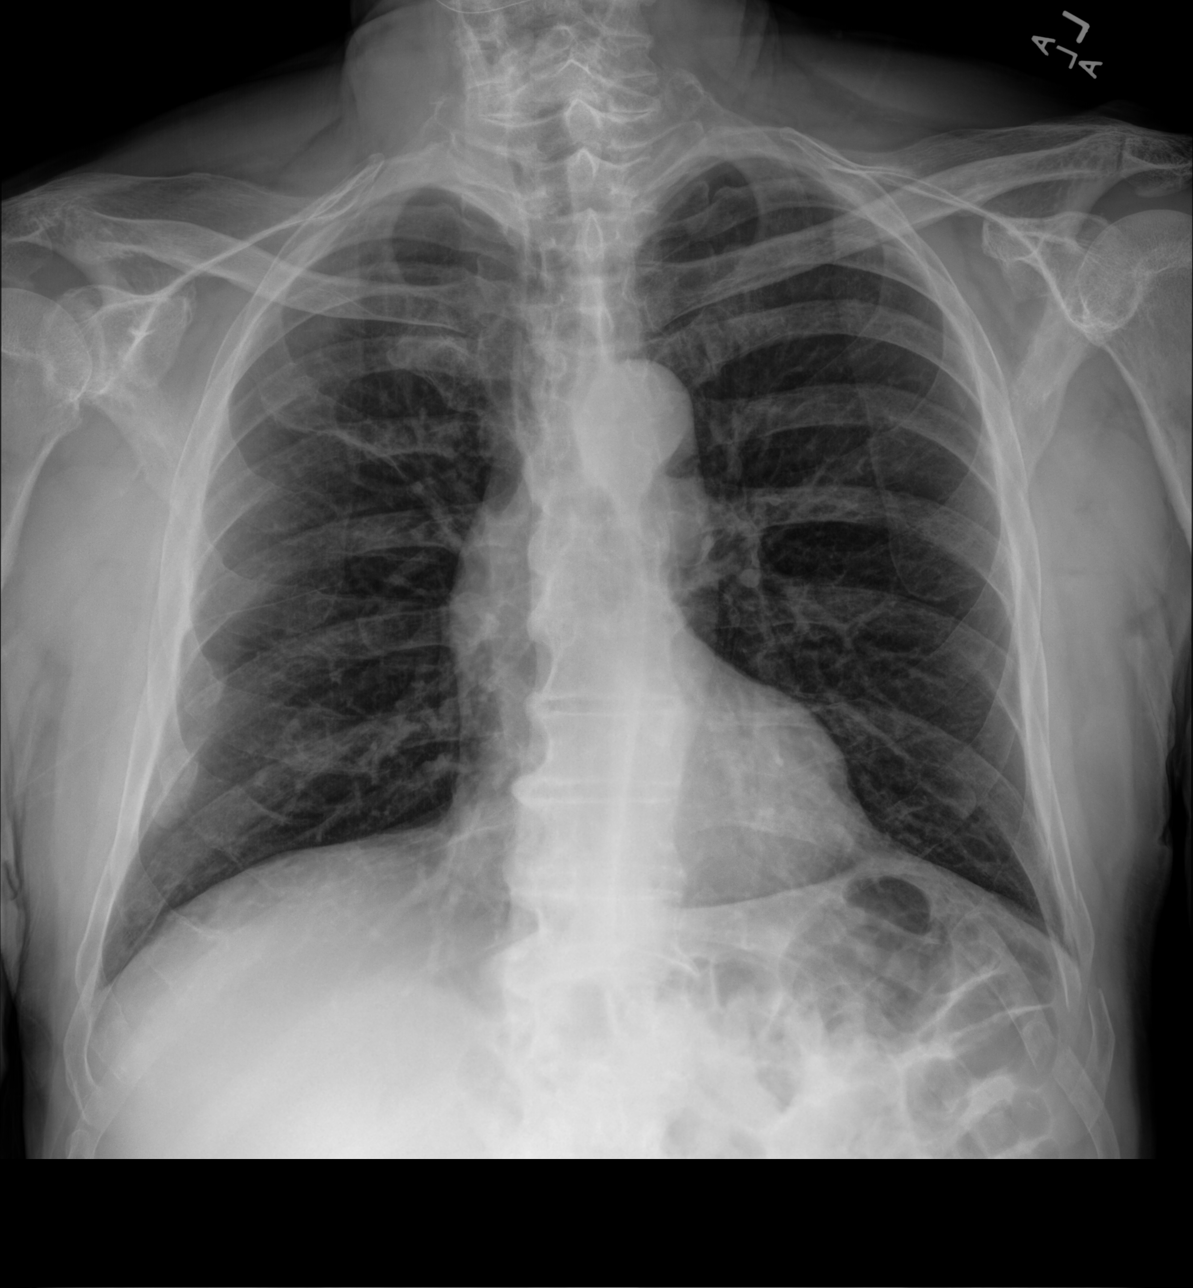

[ribs ap upper]
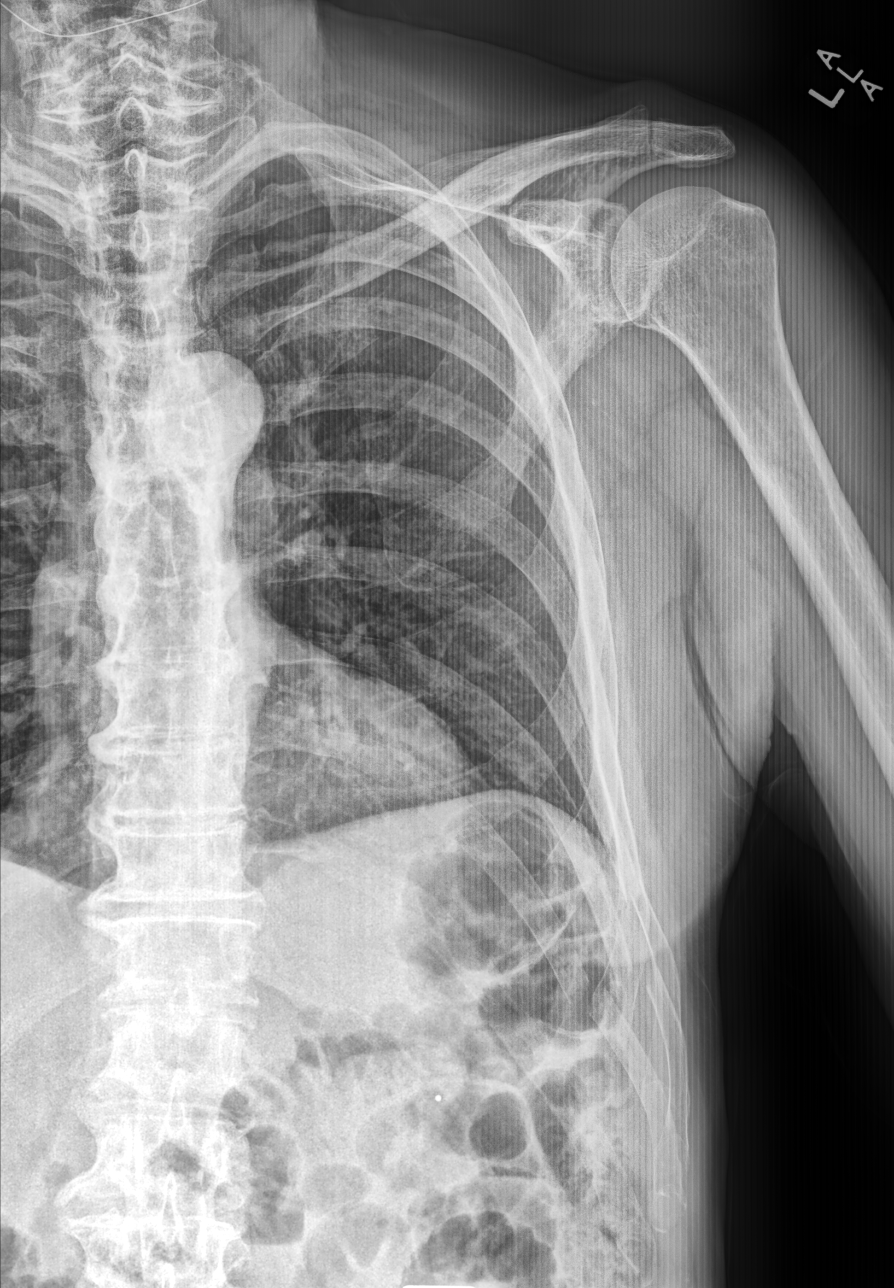

[ribs obl]
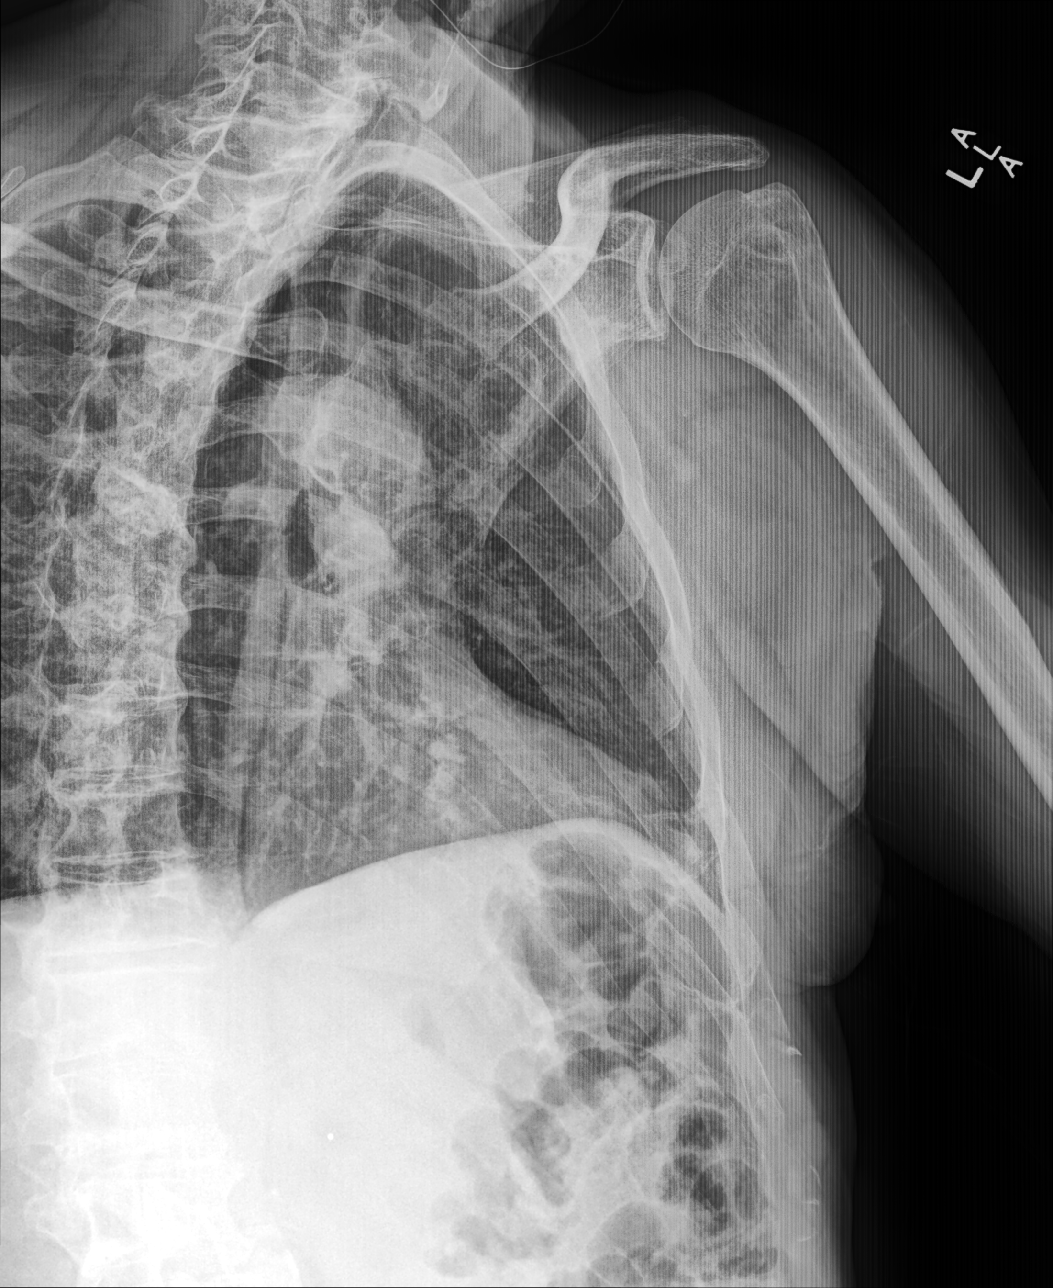

[ribs ap lower]
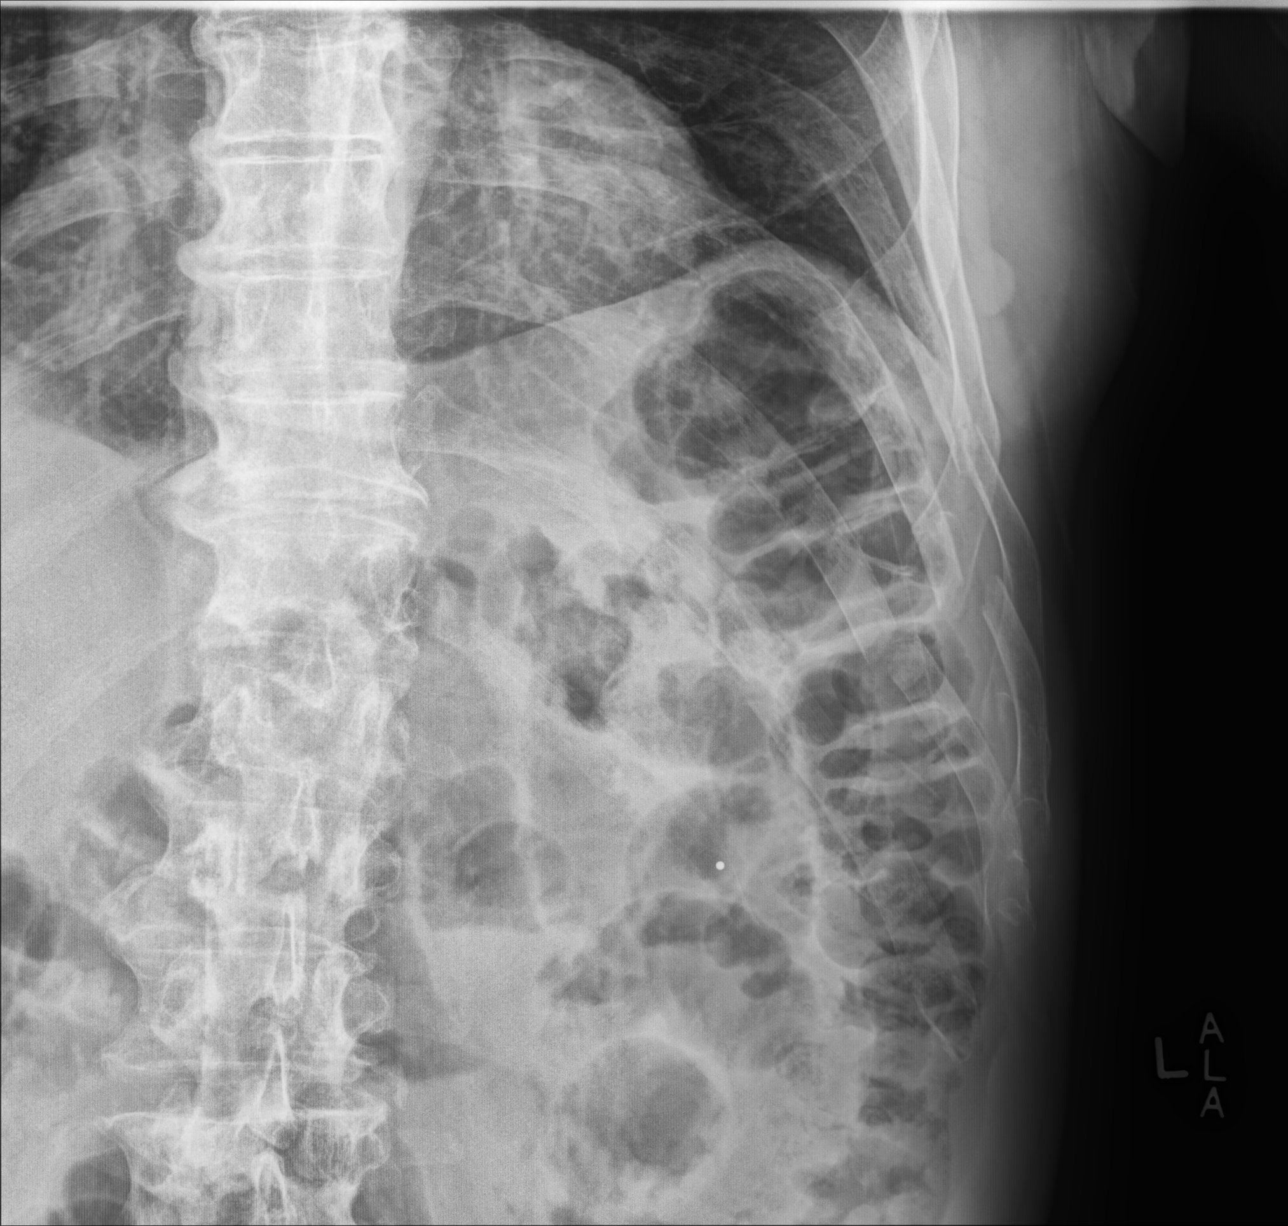

[4 of 4 positions shown; findings below may reference images not displayed]

FINDINGS: There are displaced fractures of left lateral eighth, ninth, and
tenth ribs. There may be an additional nondisplaced left lateral
seventh rib fracture. No definite segmental rib fracture. No
subcutaneous emphysema. There is no evidence of pneumothorax or
pleural effusion. Both lungs are clear. Heart size and mediastinal
contours are within normal limits.
IMPRESSION: Displaced fractures of left lateral eighth, ninth, and tenth ribs,
possible additional nondisplaced left lateral seventh rib fracture.
No pulmonary complication such as pneumothorax.

## 2023-10-06 ENCOUNTER — Ambulatory Visit: Payer: Medicare HMO | Admitting: Podiatry

## 2023-10-06 ENCOUNTER — Encounter: Payer: Self-pay | Admitting: Podiatry

## 2023-10-06 DIAGNOSIS — M79674 Pain in right toe(s): Secondary | ICD-10-CM | POA: Diagnosis not present

## 2023-10-06 DIAGNOSIS — M79675 Pain in left toe(s): Secondary | ICD-10-CM | POA: Diagnosis not present

## 2023-10-06 DIAGNOSIS — B351 Tinea unguium: Secondary | ICD-10-CM

## 2023-10-06 NOTE — Progress Notes (Signed)
This patient presents to the office with chief complaint of long thick painful nails.  Patient says the nails are painful walking and wearing shoes.  This patient is unable to self treat.  This patient is unable to trim his nails since she is unable to reach his nails.  he presents to the office for preventative foot care services.  General Appearance  Alert, conversant and in no acute stress.  Vascular  Dorsalis pedis and posterior tibial  pulses are palpable  bilaterally.  Capillary return is within normal limits  bilaterally. Temperature is within normal limits  bilaterally.  Neurologic  Senn-Weinstein monofilament wire test within normal limits  bilaterally. Muscle power within normal limits bilaterally.  Nails Thick disfigured discolored nails with subungual debris  from hallux to fifth toes bilaterally. No evidence of bacterial infection or drainage bilaterally.  Orthopedic  No limitations of motion  feet .  No crepitus or effusions noted.  No bony pathology or digital deformities noted.  Skin  normotropic skin with no porokeratosis noted bilaterally.  No signs of infections or ulcers noted.     Onychomycosis  Nails  B/L.  Pain in right toes  Pain in left toes  Debridement of nails both feet followed trimming the nails with dremel tool.    RTC  6   months.   Alsace Dowd DPM  

## 2023-10-24 ENCOUNTER — Other Ambulatory Visit: Payer: Self-pay | Admitting: Family Medicine

## 2023-10-27 ENCOUNTER — Encounter: Payer: Self-pay | Admitting: Family Medicine

## 2023-10-27 ENCOUNTER — Encounter: Payer: Self-pay | Admitting: Urology

## 2023-10-29 ENCOUNTER — Other Ambulatory Visit: Payer: Self-pay | Admitting: Family Medicine

## 2023-11-06 ENCOUNTER — Other Ambulatory Visit: Payer: Self-pay | Admitting: Family Medicine

## 2023-12-01 ENCOUNTER — Other Ambulatory Visit: Payer: Self-pay | Admitting: Family Medicine

## 2023-12-04 ENCOUNTER — Other Ambulatory Visit: Payer: Self-pay | Admitting: Family Medicine

## 2023-12-10 ENCOUNTER — Other Ambulatory Visit: Payer: Self-pay | Admitting: Family Medicine

## 2023-12-12 ENCOUNTER — Other Ambulatory Visit: Payer: Medicare HMO

## 2023-12-14 ENCOUNTER — Ambulatory Visit: Payer: Medicare HMO | Admitting: Urology

## 2023-12-30 ENCOUNTER — Other Ambulatory Visit: Payer: Self-pay | Admitting: Family Medicine

## 2024-01-03 DIAGNOSIS — L821 Other seborrheic keratosis: Secondary | ICD-10-CM | POA: Diagnosis not present

## 2024-01-03 DIAGNOSIS — D225 Melanocytic nevi of trunk: Secondary | ICD-10-CM | POA: Diagnosis not present

## 2024-01-03 DIAGNOSIS — C44722 Squamous cell carcinoma of skin of right lower limb, including hip: Secondary | ICD-10-CM | POA: Diagnosis not present

## 2024-01-03 DIAGNOSIS — L570 Actinic keratosis: Secondary | ICD-10-CM | POA: Diagnosis not present

## 2024-01-03 DIAGNOSIS — D485 Neoplasm of uncertain behavior of skin: Secondary | ICD-10-CM | POA: Diagnosis not present

## 2024-01-03 DIAGNOSIS — L578 Other skin changes due to chronic exposure to nonionizing radiation: Secondary | ICD-10-CM | POA: Diagnosis not present

## 2024-01-05 ENCOUNTER — Encounter: Payer: Self-pay | Admitting: Family Medicine

## 2024-01-05 ENCOUNTER — Ambulatory Visit: Payer: HMO | Admitting: Family Medicine

## 2024-01-05 VITALS — BP 130/78 | HR 82 | Temp 97.3°F | Ht 69.5 in | Wt 177.3 lb

## 2024-01-05 DIAGNOSIS — R059 Cough, unspecified: Secondary | ICD-10-CM

## 2024-01-05 DIAGNOSIS — J209 Acute bronchitis, unspecified: Secondary | ICD-10-CM | POA: Diagnosis not present

## 2024-01-05 LAB — POC COVID19 BINAXNOW: SARS Coronavirus 2 Ag: NEGATIVE

## 2024-01-05 MED ORDER — GUAIFENESIN-CODEINE 100-10 MG/5ML PO SOLN: 5.0000 mL | Freq: Four times a day (QID) | ORAL | 0 refills | Status: DC | PRN

## 2024-01-05 MED ORDER — METHYLPREDNISOLONE 4 MG PO TBPK: ORAL_TABLET | ORAL | 0 refills | Status: DC

## 2024-01-05 MED ORDER — ALBUTEROL SULFATE HFA 108 (90 BASE) MCG/ACT IN AERS: 2.0000 | INHALATION_SPRAY | RESPIRATORY_TRACT | 1 refills | Status: AC | PRN

## 2024-01-05 NOTE — Progress Notes (Signed)
Acute Office Visit  Subjective:     Patient ID: Daniel Sullivan, male    DOB: 1951-01-26, 73 y.o.   MRN: [id number]  Chief Complaint  Patient presents with   Cough    Productive with white sputum x2 days, tried Hycodan cough syrup with relief    Pt is reporting "dry hacking cough" that started about 2 days ago, doesn't feel tight or SOB in his chest, scant nasal congestion or drainage, no chest pain, pt states that he has mild chills the first day, but this has resolved. No known sick contacts. Pt reports a history of asthma growing up, having muscle aches currently. Has only a 10-12 year smoking history, quit in 1989, only smoked about 1/2 ppd.   Cough    Review of Systems  Respiratory:  Positive for cough.   All other systems reviewed and are negative.       Objective:    BP 130/78   Pulse 82   Temp (!) 97.3 F (36.3 C) (Oral)   Ht 5' 9.5" (1.765 m)   Wt 177 lb 4.8 oz (80.4 kg)   SpO2 98%   BMI 25.81 kg/m    Physical Exam Vitals reviewed.  Constitutional:      Appearance: Normal appearance. He is well-groomed and normal weight.  HENT:     Right Ear: Tympanic membrane normal.     Left Ear: Tympanic membrane normal.     Nose: No congestion.     Mouth/Throat:     Pharynx: No posterior oropharyngeal erythema.  Eyes:     Conjunctiva/sclera: Conjunctivae normal.  Neck:     Thyroid: No thyromegaly.  Cardiovascular:     Rate and Rhythm: Normal rate and regular rhythm.     Heart sounds: S1 normal and S2 normal. No murmur heard. Pulmonary:     Effort: Pulmonary effort is normal.     Breath sounds: Normal air entry. Wheezing present. No rales.  Lymphadenopathy:     Cervical: No cervical adenopathy.  Neurological:     General: No focal deficit present.     Mental Status: He is alert and oriented to person, place, and time.     Gait: Gait is intact.  Psychiatric:        Mood and Affect: Mood and affect normal.     Results for orders placed or performed in  visit on 01/05/24  POC COVID-19  Result Value Ref Range   SARS Coronavirus 2 Ag Negative Negative        Assessment & Plan:   Problem List Items Addressed This Visit   None Visit Diagnoses       Acute bronchitis, unspecified organism    -  Primary   Relevant Medications   albuterol (VENTOLIN HFA) 108 (90 Base) MCG/ACT inhaler   methylPREDNISolone (MEDROL DOSEPAK) 4 MG TBPK tablet   guaiFENesin-codeine 100-10 MG/5ML syrup     Cough, unspecified type       Relevant Orders   POC COVID-19 (Completed)     Expiratory wheezing on exam, most likely viral in origin, will treat with albuterol inhaler, steroid course and PRN cough medication. Reviewed CT chest from August 2024, follow up PRN  Meds ordered this encounter  Medications   albuterol (VENTOLIN HFA) 108 (90 Base) MCG/ACT inhaler    Sig: Inhale 2 puffs into the lungs every 4 (four) hours as needed for wheezing or shortness of breath.    Dispense:  18 each    Refill:  1  methylPREDNISolone (MEDROL DOSEPAK) 4 MG TBPK tablet    Sig: Take package as directed    Dispense:  21 each    Refill:  0   guaiFENesin-codeine 100-10 MG/5ML syrup    Sig: Take 5-10 mLs by mouth every 6 (six) hours as needed for cough.    Dispense:  237 mL    Refill:  0    No follow-ups on file.  Karie Georges, MD

## 2024-01-07 ENCOUNTER — Encounter: Payer: Self-pay | Admitting: Family Medicine

## 2024-01-09 ENCOUNTER — Encounter: Payer: Self-pay | Admitting: Family Medicine

## 2024-01-09 ENCOUNTER — Ambulatory Visit (INDEPENDENT_AMBULATORY_CARE_PROVIDER_SITE_OTHER): Payer: HMO | Admitting: Family Medicine

## 2024-01-09 ENCOUNTER — Ambulatory Visit: Payer: Self-pay | Admitting: Family Medicine

## 2024-01-09 VITALS — BP 110/80 | HR 84 | Temp 98.1°F | Ht 69.5 in | Wt 174.0 lb

## 2024-01-09 DIAGNOSIS — J22 Unspecified acute lower respiratory infection: Secondary | ICD-10-CM | POA: Diagnosis not present

## 2024-01-09 MED ORDER — DOXYCYCLINE HYCLATE 100 MG PO TABS: 100.0000 mg | ORAL_TABLET | Freq: Two times a day (BID) | ORAL | 0 refills | Status: DC

## 2024-01-09 NOTE — Patient Instructions (Addendum)
-  Prescribed Doxycycline 100mg  tablet, take 1 tablet every 12 hours for 7 days. Continue to take prescribed medication from visit on 01/30.  -Take Tylenol and/or Ibuprofen for fever control.  -Rest, hydrate.  -If symptoms do not improve or become worse, follow up with PCP.

## 2024-01-09 NOTE — Telephone Encounter (Signed)
  Chief Complaint: Fever Symptoms: Fever Frequency: this morning Pertinent Negatives: Patient denies abdominal pain, urination pain, headache, ear aches, difficulty breathing, chest pain, nausea, diarrhea Disposition: [] ED /[] Urgent Care (no appt availability in office) / [] Appointment(In office/virtual)/ []  [facility]/ [] Home Care/ [x] Refused Recommended Disposition /[] [facility]/ []  Follow-up with PCP Additional Notes: Patient called and advised that he was just seen.  Patient states that he vomited a little bit this morning and he now has developed a fever.  Patient denies abdominal pain, urination pain, headache, ear aches, difficulty breathing, chest pain, nausea, diarrhea. Patient states that he now has a fever of 101 and he hadn't taken anything for it.  Patient was advised that he could take medication to reduce his fever as long as there were no reasons he couldn't.  He was also advised to drink plenty of fluids. Patient had to hang up to go pick up his truck and I advised them that I would give them a call back after speaking with CAL.  CAL advised no open appointments in the next 4 hours.  Patient is called back and advised that his PCP office did not have an opening in the next 4 hours and patient states that he only wants to come to his PCP office.  Patient states he was not going to urgent care.  Patient is also advised that if he gets worse to go to the emergency room.  Patient states he isn't going to the emergency room and he just wants to come see his PCP.  Patient and his wife verbalized understanding but states that he just wants to come to his PCP office.  Reason for Disposition  [1] Fever > 101 F (38.3 C) AND [2] age > 60 years  Answer Assessment - Initial Assessment Questions 1. TEMPERATURE: "What is the most recent temperature?"  "How was it measured?"      101  2. ONSET: "When did the fever start?"      This morning 3. CHILLS: "Do you have chills?" If  yes: "How bad are they?"  (e.g., none, mild, moderate, severe)   - NONE: no chills   - MILD: feeling cold   - MODERATE: feeling very cold, some shivering (feels better under a thick blanket)   - SEVERE: feeling extremely cold with shaking chills (general body shaking, rigors; even under a thick blanket)      Very little 4. OTHER SYMPTOMS: "Do you have any other symptoms besides the fever?"  (e.g., abdomen pain, cough, diarrhea, earache, headache, sore throat, urination pain)     Slight sore throat  5. CAUSE: If there are no symptoms, ask: "What do you think is causing the fever?"      sick 6. CONTACTS: "Does anyone else in the family have an infection?"     yes 7. TREATMENT: "What have you done so far to treat this fever?" (e.g., medications)     Only steroid prescribed 8. IMMUNOCOMPROMISE: "Do you have of the following: diabetes, HIV positive, splenectomy, cancer chemotherapy, chronic steroid treatment, transplant patient, etc."     no 10. TRAVEL: "Have you traveled out of the country in the last month?" (e.g., travel history, exposures)       No  Protocols used: [facility]

## 2024-01-09 NOTE — Telephone Encounter (Signed)
Spoke with the patient and informed him a visit is needed for evaluation.  I just noticed an opening with Mardene Celeste, nurse practitioner and scheduled a visit today at 4pm.

## 2024-01-09 NOTE — Progress Notes (Signed)
   Acute Office Visit   Subjective:  Patient ID: Daniel Sullivan, male    DOB: 1951-05-20, 73 y.o.   MRN: [id number]  Chief Complaint  Patient presents with   Sinusitis    HPI:  Patient is present for an acute visit. He was seen on 01/05/2024 by Dr. Nira Conn for another acute visit. He was diagnosed with acute bronchitis and cough. Since previous appointment his symptoms have slightly changed. He did have a "dry hacking cough", but the cough feels more like it is in his chest. Non-productive, dry cough. Denies chest pain except when coughing. Denies SHOB. The biggest change has been developing a fever, 101.1 oral this morning. Denies having a fever last week/weekend. He reports he took Tylenol this morning with improvement. Denies headache. Vomited once this morning but relates it from excessive coughing. Denies sore throat and ear pain/drainage.   Patient was prescribed Albuterol Inhaler, Medtrol Dosepak, and Guaifenesin-Codeine syrup. He is almost completed steroid, used Albuterol Inhaler a few times, and used the cough syrup.   He reports over all he reports his symptoms have improved except with the fever this morning that was new.     ROS See HPI above      Objective:   BP 110/80   Pulse 84   Temp 98.1 F (36.7 C) (Oral)   Ht 5' 9.5" (1.765 m)   Wt 174 lb (78.9 kg)   SpO2 93%   BMI 25.33 kg/m    Physical Exam Vitals reviewed.  Constitutional:      General: He is not in acute distress.    Appearance: Normal appearance. He is ill-appearing (Mild). He is not toxic-appearing or diaphoretic.  HENT:     Head: Normocephalic and atraumatic.  Eyes:     General:        Right eye: No discharge.        Left eye: No discharge.     Conjunctiva/sclera: Conjunctivae normal.  Cardiovascular:     Rate and Rhythm: Normal rate and regular rhythm.     Heart sounds: Normal heart sounds. No murmur heard.    No friction rub. No gallop.  Pulmonary:     Effort: Pulmonary effort is  normal. No respiratory distress.     Breath sounds: Rales (Mild) present. No wheezing.  Musculoskeletal:        General: Normal range of motion.  Skin:    General: Skin is warm and dry.  Neurological:     General: No focal deficit present.     Mental Status: He is alert and oriented to person, place, and time. Mental status is at baseline.  Psychiatric:        Mood and Affect: Mood normal.        Behavior: Behavior normal.        Thought Content: Thought content normal.        Judgment: Judgment normal.       Assessment & Plan:  Acute respiratory infection -     Doxycycline Hyclate; Take 1 tablet (100 mg total) by mouth 2 (two) times daily.  Dispense: 14 tablet; Refill: 0  -Prescribed Doxycycline 100mg  tablet, take 1 tablet every 12 hours for 7 days. Continue to take prescribed medication from visit on 01/30.  -Take Tylenol and/or Ibuprofen for fever control.  -Rest, hydrate.  -If symptoms do not improve or become worse, follow up with PCP.   Zandra Abts, NP

## 2024-01-12 ENCOUNTER — Other Ambulatory Visit: Payer: HMO

## 2024-01-12 ENCOUNTER — Other Ambulatory Visit: Payer: Self-pay | Admitting: Family Medicine

## 2024-01-12 DIAGNOSIS — R972 Elevated prostate specific antigen [PSA]: Secondary | ICD-10-CM

## 2024-01-13 ENCOUNTER — Other Ambulatory Visit: Payer: Medicare HMO

## 2024-01-13 LAB — PSA: Prostate Specific Ag, Serum: 14.1 ng/mL — ABNORMAL HIGH (ref 0.0–4.0)

## 2024-01-17 ENCOUNTER — Ambulatory Visit: Payer: HMO | Admitting: Urology

## 2024-01-17 VITALS — BP 165/90 | HR 88 | Ht 69.0 in | Wt 173.1 lb

## 2024-01-17 DIAGNOSIS — C61 Malignant neoplasm of prostate: Secondary | ICD-10-CM | POA: Diagnosis not present

## 2024-01-17 DIAGNOSIS — R972 Elevated prostate specific antigen [PSA]: Secondary | ICD-10-CM

## 2024-01-17 NOTE — Progress Notes (Signed)
I, Amy L Pierron, acting as a scribe for Vanna Scotland, MD.,have documented all relevant documentation on the behalf of Vanna Scotland, MD,as directed by  Vanna Scotland, MD while in the presence of Vanna Scotland, MD.  01/17/2024 11:29 AM   Daniel Sullivan 1951/07/23 [id number]  Referring provider: Kristian Covey, MD [address] [place],  [place] [address]  Chief Complaint  Patient presents with   Elevated PSA    HPI: 73 year-old male with a personal history of low-risk prostate cancer presents today for a follow-up. He was last seen December 2023.   S/p prostate MRI in 01/2019 which showed no lesions, PI-RADS 1 ; volume 3.7 x 3.2 x 5.1 cm   He underwent prostate biopsy on 10/20/2021 which revealed Gleason 3+3=6 involving 1 core at the right apex affecting 14%. TRUS 56.6 cc.   He had another biopsy in 2014.  Presumably this was negative, done by Dr. Julien Girt.  His most recent MRI on 12/21/2022 showed some heterogeneous low-intensity signal consistent with previous probable inflammation, PI-RADS 2, no significant interval change.   He denies any urinary symptoms. He had a fever and a cough last week. His primary care gave him antibiotics and he finished those on Sunday. He has also been feeling dizzy lately.   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   Diverticulosis    Elevated PSA    GERD (gastroesophageal reflux disease)    Hemorrhoids    History of blood clots    Hx of adenomatous colonic polyps    Hyperlipidemia    Hypertension    Hypogonadism in  male     Surgical History: Past Surgical History:  Procedure Laterality Date   ANAL RECTAL MANOMETRY N/A 09/07/2017   Procedure: ANO RECTAL MANOMETRY;  Surgeon: Napoleon Form, MD;  Location: [facility];  Service: Endoscopy;  Laterality: N/A;   COLONOSCOPY W/ BIOPSIES     ESOPHAGOGASTRODUODENOSCOPY     Multiple   GIVENS CAPSULE STUDY  05/13/2021   HEMORRHOID BANDING      Home Medications:  Allergies as of 01/17/2024   No Known Allergies      Medication List        Accurate as of January 17, 2024 11:29 AM. If you have any questions, ask your nurse or doctor.          STOP taking these medications    doxycycline 100 MG tablet Commonly known as: VIBRA-TABS   guaiFENesin-codeine 100-10 MG/5ML syrup   methylPREDNISolone 4 MG Tbpk tablet Commonly known as: MEDROL DOSEPAK   Sodium Fluoride 5000 PPM 1.1 % Crea dental cream Generic drug: sodium fluoride       TAKE these medications    albuterol 108 (90 Base) MCG/ACT inhaler Commonly known as: VENTOLIN HFA Inhale 2 puffs into the lungs every 4 (four) hours as needed for wheezing or shortness of breath.   amLODipine 5 MG tablet  Commonly known as: NORVASC TAKE 1 TABLET BY MOUTH EVERY DAY   ascorbic acid 500 MG tablet Commonly known as: VITAMIN C Take 500 mg by mouth daily.   fluticasone 50 MCG/ACT nasal spray Commonly known as: FLONASE Place 2 sprays into both nostrils daily. What changed:  when to take this reasons to take this   Glucosamine-Chondroit-Vit C-Mn Tabs Take 1 tablet by mouth daily.   losartan 50 MG tablet Commonly known as: COZAAR TAKE 1 TABLET BY MOUTH EVERY DAY   MENS MULTI VITAMIN & MINERAL PO Take 1 tablet by mouth daily.   omeprazole 20 MG capsule Commonly known as: PRILOSEC TAKE 1 CAPSULE BY MOUTH EVERY DAY   pramipexole 0.25 MG tablet Commonly known as: MIRAPEX TAKE 1 TABLET BY MOUTH EVERYDAY AT BEDTIME   pravastatin 40 MG tablet Commonly known as: PRAVACHOL TAKE  1 TABLET BY MOUTH EVERY DAY   sildenafil 100 MG tablet Commonly known as: VIAGRA TAKE HALF TO ONE TABLET BY MOUTH DAILY AS NEEDED FOR ERECTILE DYSFUNCTION        Family History: Family History  Problem Relation Age of Onset   Alcohol abuse Father    Suicidality Father    Emphysema Father    Melanoma Brother 39   Stomach cancer Neg Hx    Colon cancer Neg Hx    Rectal cancer Neg Hx    Esophageal cancer Neg Hx     Social History:  reports that he quit smoking about 35 years ago. His smoking use included cigarettes. He started smoking about 53 years ago. He has a 9 pack-year smoking history. He has never used smokeless tobacco. He reports current alcohol use. He reports that he does not use drugs.   Physical Exam: BP (!) 165/90   Pulse 88   Ht 5\' 9"  (1.753 m)   Wt 173 lb 2 oz (78.5 kg)   BMI 25.57 kg/m   Constitutional:  Alert and oriented, No acute distress. HEENT: [place] AT, moist mucus membranes.  Trachea midline, no masses. GU: Rectal exam benign, rubbery nodularity. Neurologic: Grossly intact, no focal deficits, moving all 4 extremities. Psychiatric: Normal mood and affect.   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 Gleason 3+3=6 adenocarcinoma identified on biopsy 10/20/2021. PSA equal 9.8 on 11/16/2022.   EXAM: MR PROSTATE WITHOUT AND WITH CONTRAST   TECHNIQUE: Multiplanar multisequence MRI images were obtained of the pelvis centered about the prostate. Pre and post contrast images were obtained.   CONTRAST:  7.67mL GADAVIST GADOBUTROL 1 MMOL/ML IV SOLN   COMPARISON:  Prostate MRI 02/02/2019   FINDINGS: Prostate: There is homogeneous low signal intensity within the peripheral zone on T2 weighted imaging (which is atypical). No focal lesion identified. There is no foci of restricted diffusion.   Postcontrast T1 weighted imaging demonstrates no enhancement of suspicion in the  peripheral zone.   The transitional zone is enlarged by capsulated nodules without suspicious imaging characteristics on T2 weighted imaging.   Volume: 5.3 x 3.7 x 4.2 cm (volume = 43 cm^3)   Transcapsular spread:  Present/absent/other   Seminal vesicle involvement: present/absent/other   Neurovascular bundle involvement: present/absent/other   Pelvic adenopathy: present/absent/other   Bone metastasis: present/absent/other   Other findings:   IMPRESSION: 1. No high-grade carcinoma identified within the peripheral zone. Homogeneous low signal intensity within the peripheral zone suggest prior prostate inflammation. PI-RADS: 2 2. Enlarged nodular transitional zone most consistent with benign prostate hypertrophy. PI-RADS: 2 3. No  significant interval change from comparison MRI.   Electronically Signed   By: Genevive Bi M.D.   On: 12/21/2022 16:31  Personally reviewed the above scan and agree with radiologic interpretation.   Assessment & Plan:    1. Low risk prostate cancer on active surveillance with rising PSA  -  He does have a personal history of prostate inflammation and some fluctuations. His PSA has a fairly significant rise over the last six months, however, suspect it may be inflammatory. Will recheck in about six weeks to see if it stays elevated. If it is, then consider repeat prostate MRI versus biopsy. He is agreeable with this plan.  Return in about 6 weeks (around 02/28/2024) for PSA.  Will determine f/u based on this repeat number  I have reviewed the above documentation for accuracy and completeness, and I agree with the above.   Vanna Scotland, MD   [facility] [address], [address] [place], [place] [address] [phone]

## 2024-01-24 ENCOUNTER — Encounter: Payer: Self-pay | Admitting: Family Medicine

## 2024-01-24 ENCOUNTER — Ambulatory Visit (INDEPENDENT_AMBULATORY_CARE_PROVIDER_SITE_OTHER): Payer: HMO | Admitting: Family Medicine

## 2024-01-24 VITALS — BP 130/82 | HR 96 | Temp 98.2°F | Ht 69.0 in | Wt 176.5 lb

## 2024-01-24 DIAGNOSIS — I340 Nonrheumatic mitral (valve) insufficiency: Secondary | ICD-10-CM | POA: Diagnosis not present

## 2024-01-24 DIAGNOSIS — R42 Dizziness and giddiness: Secondary | ICD-10-CM | POA: Diagnosis not present

## 2024-01-24 DIAGNOSIS — M542 Cervicalgia: Secondary | ICD-10-CM | POA: Diagnosis not present

## 2024-01-24 DIAGNOSIS — I10 Essential (primary) hypertension: Secondary | ICD-10-CM | POA: Diagnosis not present

## 2024-01-24 NOTE — Patient Instructions (Signed)
I will be setting up echocardiogram to assess mitral valve murmur  Set up complete physical soon  Consider OTC salon pas patch to left neck at night.

## 2024-01-24 NOTE — Progress Notes (Unsigned)
Established Patient Office Visit  Subjective   Patient ID: DEMETRIA LIGHTSEY, male    DOB: [birth year]  Age: 73 y.o. MRN: [id number]  Chief Complaint  Patient presents with   Neck Pain   Dizziness    Started 2 weeks ago     HPI  {History (Optional):23778} Worth has history of hypertension, GERD, IBS, hyperlipidemia.  He is seen today for the following issues  Left sided neck pain for the past couple weeks or so.  Denies any specific injury.  He thinks he may have slept wrong.  No radiculitis symptoms.  He tried some topicals without much improvement.  Also went for muscle massage which did not seem to help any.  Denies any upper extremity numbness or weakness.  He has had some intermittent dizziness for the past couple weeks.  He describes this as lightheadedness but really more frequently sensation of disequilibrium or feeling off balance.  Does occasionally have transient vertigo symptoms when he gets out of bed.  Denies any focal weakness, headaches, ataxia, speech changes, or any other focal neurologic symptoms.  He has hypertension treated with losartan and amlodipine.  Denies any recent orthostatic type symptoms.  Blood pressures been stable.  He has hyperlipidemia treated with pravastatin 40 mg daily.  He is overdue for follow-up labs.  He plans to set up physical soon.  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   Diverticulosis    Elevated PSA    GERD (gastroesophageal reflux disease)    Hemorrhoids    History of blood clots    Hx of adenomatous colonic polyps    Hyperlipidemia    Hypertension    Hypogonadism in male    Past Surgical History:  Procedure Laterality Date   ANAL RECTAL MANOMETRY N/A 09/07/2017   Procedure: ANO RECTAL MANOMETRY;  Surgeon: Napoleon Form, MD;  Location: [facility];  Service: Endoscopy;  Laterality: N/A;   COLONOSCOPY W/ BIOPSIES      ESOPHAGOGASTRODUODENOSCOPY     Multiple   GIVENS CAPSULE STUDY  05/13/2021   HEMORRHOID BANDING      reports that he quit smoking about 35 years ago. His smoking use included cigarettes. He started smoking about 53 years ago. He has a 9 pack-year smoking history. He has never used smokeless tobacco. He reports current alcohol use. He reports that he does not use drugs. family history includes Alcohol abuse in his father; Emphysema in his father; Melanoma (age of onset: 20) in his brother; Suicidality in his father. No Known Allergies  Review of Systems  Constitutional:  Negative for chills, fever and malaise/fatigue.  Eyes:  Negative for blurred vision.  Respiratory:  Negative for shortness of breath.   Cardiovascular:  Negative for chest pain.  Genitourinary:  Negative for dysuria.  Neurological:  Positive for dizziness. Negative for weakness and headaches.      Objective:     BP 130/82 (BP Location: Left Arm, Patient Position: Sitting, Cuff Size: Large)   Pulse 96   Temp 98.2 F (36.8 C) (Oral)   Ht 5\' 9"  (1.753 m)   Wt 176 lb 8 oz (80.1 kg)   SpO2 96%   BMI 26.06 kg/m  BP Readings from Last 3 Encounters:  01/24/24 130/82  01/17/24 (!) 165/90  01/09/24 110/80   Wt Readings from Last 3 Encounters:  01/24/24 176 lb  8 oz (80.1 kg)  01/17/24 173 lb 2 oz (78.5 kg)  01/09/24 174 lb (78.9 kg)      Physical Exam Vitals reviewed.  Constitutional:      General: He is not in acute distress.    Appearance: He is well-developed. He is not ill-appearing.  Eyes:     Pupils: Pupils are equal, round, and reactive to light.  Neck:     Thyroid: No thyromegaly.  Cardiovascular:     Rate and Rhythm: Normal rate and regular rhythm.     Heart sounds: Murmur heard.     Comments: He does have 2-3/6 systolic ejection murmur left lateral chest consistent with mitral valve murmur.  This is worse with going from squatting to standing Pulmonary:     Effort: Pulmonary effort is normal. No  respiratory distress.     Breath sounds: Normal breath sounds. No wheezing or rales.  Musculoskeletal:     Cervical back: Neck supple.     Right lower leg: No edema.     Left lower leg: No edema.     Comments: Palpable tension and some mild tenderness left paracervical muscles.  Neurological:     Mental Status: He is alert and oriented to person, place, and time.      No results found for any visits on 01/24/24.  {Labs (Optional):23779}  The 10-year ASCVD risk score (Arnett DK, et al., 2019) is: 21.7%    Assessment & Plan:   #1 left cervical neck muscle soreness and tightness.  Denies specific injury.  Continue conservative topical therapies such as Salonpas patch.  Tylenol as needed.  Avoid nonsteroidals.  Consider trial of physical therapy if not improving in a week or 2  #2 heart murmur auscultated over mitral valve region.  We have not noted this previously.  Suspect mitral regurgitation murmur.  Worse with squat to stand.  Set up echocardiogram to further assess  #3 hypertension stable and controlled.  Continue amlodipine and losartan  #4 hyperlipidemia treated with pravastatin.  Overdue for labs.  He plans to set up complete physical soon and will get lab work then  Evelena Peat, MD

## 2024-01-25 ENCOUNTER — Other Ambulatory Visit: Payer: Self-pay | Admitting: Family Medicine

## 2024-01-26 ENCOUNTER — Other Ambulatory Visit: Payer: Self-pay | Admitting: Family Medicine

## 2024-01-30 ENCOUNTER — Encounter: Payer: Self-pay | Admitting: Family Medicine

## 2024-02-07 ENCOUNTER — Other Ambulatory Visit: Payer: Self-pay | Admitting: Family Medicine

## 2024-02-21 ENCOUNTER — Encounter: Payer: Self-pay | Admitting: Family Medicine

## 2024-02-21 ENCOUNTER — Ambulatory Visit (HOSPITAL_COMMUNITY): Payer: HMO | Attending: Family Medicine

## 2024-02-21 ENCOUNTER — Ambulatory Visit (INDEPENDENT_AMBULATORY_CARE_PROVIDER_SITE_OTHER): Payer: HMO | Admitting: Family Medicine

## 2024-02-21 VITALS — BP 138/90 | HR 82 | Temp 97.7°F | Ht 68.7 in | Wt 172.5 lb

## 2024-02-21 DIAGNOSIS — D509 Iron deficiency anemia, unspecified: Secondary | ICD-10-CM

## 2024-02-21 DIAGNOSIS — E538 Deficiency of other specified B group vitamins: Secondary | ICD-10-CM | POA: Diagnosis not present

## 2024-02-21 DIAGNOSIS — I340 Nonrheumatic mitral (valve) insufficiency: Secondary | ICD-10-CM | POA: Insufficient documentation

## 2024-02-21 DIAGNOSIS — I10 Essential (primary) hypertension: Secondary | ICD-10-CM

## 2024-02-21 DIAGNOSIS — R7303 Prediabetes: Secondary | ICD-10-CM

## 2024-02-21 DIAGNOSIS — E785 Hyperlipidemia, unspecified: Secondary | ICD-10-CM | POA: Diagnosis not present

## 2024-02-21 DIAGNOSIS — Z0000 Encounter for general adult medical examination without abnormal findings: Secondary | ICD-10-CM

## 2024-02-21 DIAGNOSIS — R42 Dizziness and giddiness: Secondary | ICD-10-CM | POA: Diagnosis not present

## 2024-02-21 LAB — ECHOCARDIOGRAM COMPLETE
Area-P 1/2: 3.27 cm2
Height: 68.701 in
S' Lateral: 3.7 cm
Weight: 2760 [oz_av]

## 2024-02-21 LAB — CBC WITH DIFFERENTIAL/PLATELET
Basophils Absolute: 0 10*3/uL (ref 0.0–0.1)
Basophils Relative: 0.5 % (ref 0.0–3.0)
Eosinophils Absolute: 0.2 10*3/uL (ref 0.0–0.7)
Eosinophils Relative: 4.2 % (ref 0.0–5.0)
HCT: 41.5 % (ref 39.0–52.0)
Hemoglobin: 13.7 g/dL (ref 13.0–17.0)
Lymphocytes Relative: 17.9 % (ref 12.0–46.0)
Lymphs Abs: 1 10*3/uL (ref 0.7–4.0)
MCHC: 33 g/dL (ref 30.0–36.0)
MCV: 93.9 fl (ref 78.0–100.0)
Monocytes Absolute: 0.6 10*3/uL (ref 0.1–1.0)
Monocytes Relative: 11.2 % (ref 3.0–12.0)
Neutro Abs: 3.6 10*3/uL (ref 1.4–7.7)
Neutrophils Relative %: 66.2 % (ref 43.0–77.0)
Platelets: 177 10*3/uL (ref 150.0–400.0)
RBC: 4.42 Mil/uL (ref 4.22–5.81)
RDW: 14.4 % (ref 11.5–15.5)
WBC: 5.5 10*3/uL (ref 4.0–10.5)

## 2024-02-21 LAB — LIPID PANEL
Cholesterol: 215 mg/dL — ABNORMAL HIGH (ref 0–200)
HDL: 68 mg/dL (ref >39.00)
LDL Cholesterol: 125 mg/dL — ABNORMAL HIGH (ref 0–99)
NonHDL: 146.56
Total CHOL/HDL Ratio: 3
Triglycerides: 107 mg/dL (ref 0.0–149.0)
VLDL: 21.4 mg/dL (ref 0.0–40.0)

## 2024-02-21 LAB — COMPREHENSIVE METABOLIC PANEL
ALT: 16 U/L (ref 0–53)
AST: 25 U/L (ref 0–37)
Albumin: 4.4 g/dL (ref 3.5–5.2)
Alkaline Phosphatase: 56 U/L (ref 39–117)
BUN: 13 mg/dL (ref 6–23)
CO2: 30 meq/L (ref 19–32)
Calcium: 8.9 mg/dL (ref 8.4–10.5)
Chloride: 97 meq/L (ref 96–112)
Creatinine, Ser: 0.74 mg/dL (ref 0.40–1.50)
GFR: 89.56 mL/min (ref >60.00)
Glucose, Bld: 106 mg/dL — ABNORMAL HIGH (ref 70–99)
Potassium: 4.1 meq/L (ref 3.5–5.1)
Sodium: 137 meq/L (ref 135–145)
Total Bilirubin: 0.5 mg/dL (ref 0.2–1.2)
Total Protein: 7.2 g/dL (ref 6.0–8.3)

## 2024-02-21 LAB — VITAMIN B12: Vitamin B-12: 494 pg/mL (ref 211–911)

## 2024-02-21 LAB — HEMOGLOBIN A1C: Hgb A1c MFr Bld: 6.3 % (ref 4.6–6.5)

## 2024-02-21 LAB — TSH: TSH: 3.59 u[IU]/mL (ref 0.35–5.50)

## 2024-02-21 NOTE — Progress Notes (Signed)
 Established Patient Office Visit  Subjective   Patient ID: Daniel Sullivan, male    DOB: [birth year]  Age: 73 y.o. MRN: [id number]  Chief Complaint  Patient presents with   Annual Exam    HPI   Mr. Kenner is here for annual physical exam.  Past medical history reviewed and includes history of hypertension, GERD, hyperlipidemia, past history of iron deficiency anemia, prediabetes range blood sugars, vitamin B12 deficiency.  He does take vitamin B12 supplement regularly.  Generally doing well.  He has been very active.  Has been an avid cyclist in the past.  Does have some intermittent lightheadedness.  Occasional sense of imbalance but also sense of lightheadedness with standing.  This is not consistently.  No recent syncope.  No chest pains.  No shortness of breath.  Did have recent heart murmur noted over mitral valve region and has echocardiogram scheduled for later today.  He has history of chronic elevated PSA followed by urology.  No evidence for cancer.  His current medications include losartan 50 mg daily, Mirapex 0.25 mg nightly as needed for restless leg symptoms, pravastatin 40 mg daily, omeprazole 20 mg daily, amlodipine 5 mg daily.  Health maintenance reviewed:  Health Maintenance  Topic Date Due   Medicare Annual Wellness (AWV)  Never done   INFLUENZA VACCINE  03/05/2024 (Originally 07/07/2023)   COVID-19 Vaccine (4 - 2024-25 season) 03/08/2024 (Originally 08/07/2023)   Colonoscopy  03/20/2030   DTaP/Tdap/Td (3 - Td or Tdap) 02/02/2032   Pneumonia Vaccine 63+ Years old  Completed   Hepatitis C Screening  Completed   Zoster Vaccines- Shingrix  Completed   HPV VACCINES  Aged Out   -Vaccines up-to-date with exception of RSV vaccine.  He will consider at some point this year.  Family history-mother died age 81 unknown cause.  He had a brother that died age 23 of melanoma complications.  Father died age 46 from suicide.  His father had history of alcohol abuse.  He has another  brother who is alive and well.   Social history-quit smoking 1989 after about 10-year history.  Occasional alcohol use.  Retired from [employer].  Exercises regularly especially with cycling.  Does a lot of walking as well.  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   Diverticulosis    Elevated PSA    GERD (gastroesophageal reflux disease)    Hemorrhoids    History of blood clots    Hx of adenomatous colonic polyps    Hyperlipidemia    Hypertension    Hypogonadism in male    Past Surgical History:  Procedure Laterality Date   ANAL RECTAL MANOMETRY N/A 09/07/2017   Procedure: ANO RECTAL MANOMETRY;  Surgeon: Napoleon Form, MD;  Location: [facility];  Service: Endoscopy;  Laterality: N/A;   COLONOSCOPY W/ BIOPSIES     ESOPHAGOGASTRODUODENOSCOPY     Multiple   GIVENS CAPSULE STUDY  05/13/2021   HEMORRHOID BANDING      reports that he quit smoking about 36 years ago. His smoking use included cigarettes. He started smoking about 54 years ago. He has a 9 pack-year smoking history. He has never used smokeless tobacco. He reports current alcohol use. He reports that he does not use drugs. family history includes Alcohol abuse in his father; Emphysema in his father; Melanoma (age of onset: 64) in  his brother; Suicidality in his father. No Known Allergies   Review of Systems  Constitutional:  Negative for malaise/fatigue and weight loss.  Eyes:  Negative for blurred vision.  Respiratory:  Negative for shortness of breath.   Cardiovascular:  Negative for chest pain.  Gastrointestinal:  Negative for abdominal pain, blood in stool, constipation, diarrhea, nausea and vomiting.  Genitourinary:  Negative for dysuria.  Musculoskeletal:  Positive for neck pain.  Neurological:  Positive for dizziness. Negative for weakness and headaches.       See HPI      Objective:     BP (!) 138/90  (BP Location: Left Arm, Patient Position: Sitting, Cuff Size: Normal)   Pulse 82   Temp 97.7 F (36.5 C) (Oral)   Ht 5' 8.7" (1.745 m)   Wt 172 lb 8 oz (78.2 kg)   SpO2 95%   BMI 25.70 kg/m  BP Readings from Last 3 Encounters:  02/21/24 (!) 138/90  01/24/24 130/82  01/17/24 (!) 165/90   Wt Readings from Last 3 Encounters:  02/21/24 172 lb 8 oz (78.2 kg)  01/24/24 176 lb 8 oz (80.1 kg)  01/17/24 173 lb 2 oz (78.5 kg)      Physical Exam Vitals reviewed.  Constitutional:      Appearance: He is well-developed.  HENT:     Right Ear: External ear normal.     Left Ear: External ear normal.  Eyes:     Pupils: Pupils are equal, round, and reactive to light.  Neck:     Thyroid: No thyromegaly.  Cardiovascular:     Rate and Rhythm: Normal rate and regular rhythm.     Heart sounds: Murmur heard.     Comments: He has systolic heart murmur heard over mitral valve region. Pulmonary:     Effort: Pulmonary effort is normal. No respiratory distress.     Breath sounds: Normal breath sounds. No wheezing or rales.  Musculoskeletal:     Cervical back: Neck supple.  Neurological:     Mental Status: He is alert and oriented to person, place, and time.      No results found for any visits on 02/21/24.    The 10-year ASCVD risk score (Arnett DK, et al., 2019) is: 25.6%    Assessment & Plan:   Problem List Items Addressed This Visit       Unprioritized   Vitamin B 12 deficiency   Relevant Orders   Vitamin B12   Iron deficiency anemia   Relevant Orders   CBC with Differential/Platelet   Hyperlipidemia   Relevant Orders   Comprehensive metabolic panel   Lipid panel   Essential hypertension   Prediabetes   Relevant Orders   Hemoglobin A1c   Other Visit Diagnoses       Well adult exam    -  Primary     Vitamin B12 deficiency         Dizziness       Relevant Orders   TSH     Patient here today for a well exam/physical exam.  We discussed several health maintenance  issues as follows  -PSA being followed by urology with history of chronic elevation but no evidence for cancer -Recommend continue annual flu vaccine -Consider RSV vaccine at some point this year -Other vaccines up-to-date  -Echocardiogram pending for later today to evaluate recently noted mitral systolic murmur.  Asymptomatic with exception of some lightheadedness occasionally with exertion  -Obtaining follow-up labs as above  No follow-ups  on file.    Evelena Peat, MD

## 2024-02-21 NOTE — Patient Instructions (Signed)
 Monitor home BP at least a few times per week and let me know if consistently > 140/90.

## 2024-02-22 ENCOUNTER — Encounter: Payer: Self-pay | Admitting: Urology

## 2024-02-22 NOTE — Addendum Note (Signed)
 Addended by: Kristian Covey on: 02/22/2024 10:30 AM   Modules accepted: Orders

## 2024-02-24 NOTE — Telephone Encounter (Signed)
 noted

## 2024-02-28 ENCOUNTER — Other Ambulatory Visit: Payer: HMO

## 2024-02-28 DIAGNOSIS — R972 Elevated prostate specific antigen [PSA]: Secondary | ICD-10-CM | POA: Diagnosis not present

## 2024-02-29 LAB — PSA: Prostate Specific Ag, Serum: 11.2 ng/mL — ABNORMAL HIGH (ref 0.0–4.0)

## 2024-03-13 ENCOUNTER — Ambulatory Visit (INDEPENDENT_AMBULATORY_CARE_PROVIDER_SITE_OTHER): Admitting: Family Medicine

## 2024-03-13 ENCOUNTER — Encounter: Payer: Self-pay | Admitting: Podiatry

## 2024-03-13 ENCOUNTER — Encounter: Payer: Self-pay | Admitting: Urology

## 2024-03-13 ENCOUNTER — Encounter: Payer: Self-pay | Admitting: Family Medicine

## 2024-03-13 ENCOUNTER — Ambulatory Visit: Payer: Medicare HMO | Admitting: Podiatry

## 2024-03-13 VITALS — BP 142/80 | HR 87 | Temp 97.5°F | Ht 68.75 in | Wt 171.9 lb

## 2024-03-13 DIAGNOSIS — R7303 Prediabetes: Secondary | ICD-10-CM

## 2024-03-13 DIAGNOSIS — Z85828 Personal history of other malignant neoplasm of skin: Secondary | ICD-10-CM | POA: Diagnosis not present

## 2024-03-13 DIAGNOSIS — M79675 Pain in left toe(s): Secondary | ICD-10-CM | POA: Diagnosis not present

## 2024-03-13 DIAGNOSIS — M542 Cervicalgia: Secondary | ICD-10-CM | POA: Diagnosis not present

## 2024-03-13 DIAGNOSIS — M79674 Pain in right toe(s): Secondary | ICD-10-CM

## 2024-03-13 DIAGNOSIS — Z08 Encounter for follow-up examination after completed treatment for malignant neoplasm: Secondary | ICD-10-CM | POA: Diagnosis not present

## 2024-03-13 DIAGNOSIS — B351 Tinea unguium: Secondary | ICD-10-CM

## 2024-03-13 DIAGNOSIS — L02435 Carbuncle of right lower limb: Secondary | ICD-10-CM | POA: Diagnosis not present

## 2024-03-13 DIAGNOSIS — L821 Other seborrheic keratosis: Secondary | ICD-10-CM | POA: Diagnosis not present

## 2024-03-13 DIAGNOSIS — L578 Other skin changes due to chronic exposure to nonionizing radiation: Secondary | ICD-10-CM | POA: Diagnosis not present

## 2024-03-13 DIAGNOSIS — L570 Actinic keratosis: Secondary | ICD-10-CM | POA: Diagnosis not present

## 2024-03-13 DIAGNOSIS — I340 Nonrheumatic mitral (valve) insufficiency: Secondary | ICD-10-CM | POA: Diagnosis not present

## 2024-03-13 DIAGNOSIS — L814 Other melanin hyperpigmentation: Secondary | ICD-10-CM | POA: Diagnosis not present

## 2024-03-13 DIAGNOSIS — I3489 Other nonrheumatic mitral valve disorders: Secondary | ICD-10-CM | POA: Insufficient documentation

## 2024-03-13 DIAGNOSIS — L080 Pyoderma: Secondary | ICD-10-CM | POA: Diagnosis not present

## 2024-03-13 DIAGNOSIS — Z09 Encounter for follow-up examination after completed treatment for conditions other than malignant neoplasm: Secondary | ICD-10-CM | POA: Diagnosis not present

## 2024-03-13 NOTE — Progress Notes (Signed)
This patient presents to the office with chief complaint of long thick painful nails.  Patient says the nails are painful walking and wearing shoes.  This patient is unable to self treat.  This patient is unable to trim his nails since she is unable to reach his nails.  he presents to the office for preventative foot care services.  General Appearance  Alert, conversant and in no acute stress.  Vascular  Dorsalis pedis and posterior tibial  pulses are palpable  bilaterally.  Capillary return is within normal limits  bilaterally. Temperature is within normal limits  bilaterally.  Neurologic  Senn-Weinstein monofilament wire test within normal limits  bilaterally. Muscle power within normal limits bilaterally.  Nails Thick disfigured discolored nails with subungual debris  from hallux to fifth toes bilaterally. No evidence of bacterial infection or drainage bilaterally.  Orthopedic  No limitations of motion  feet .  No crepitus or effusions noted.  No bony pathology or digital deformities noted.  Skin  normotropic skin with no porokeratosis noted bilaterally.  No signs of infections or ulcers noted.     Onychomycosis  Nails  B/L.  Pain in right toes  Pain in left toes  Debridement of nails both feet followed trimming the nails with dremel tool.    RTC  4  months.   Gregory Mayer DPM  

## 2024-03-13 NOTE — Progress Notes (Signed)
 Established Patient Office Visit  Subjective   Patient ID: Daniel Sullivan, male    DOB: 12/11/1950  Age: 73 y.o. MRN: [id number]  Chief Complaint  Patient presents with   Rash    Patient came in today for rash on the back of the right leg    HPI   Mr. Bekker developed swollen red area posterior right lower thigh and actually ended up getting into see dermatologist earlier today.  He states he underwent incision and drainage and culture was obtained.  He was started on doxycycline.  He feels like thigh is less swollen since then.  Denies any fevers or chills.  No known history of MRSA.  He had first noticed some swelling and pain on Sunday and initially thought this may have been some sort of bite.  Has had some recent upper neck pains.  Does a fair amount of cycling thinks is may be related.  Denies any specific injury.  Frequent neck muscle stiffness somewhat bilaterally.  Has had some occasional dizziness recently with extreme exertion.  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  Recently cycle 12 miles and noted with steep hills he had some dizziness.  No history of syncope.  Mild shortness of breath with extreme exertion.  No chest pain.  Pending follow-up with cardiologist in July.  He does plan trip to [place] late May and early June.  Had multiple recent labs.  A1c 6.3%.  Hemoglobin normal.  He is very active.  Cycles regularly.  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   Diverticulosis    Elevated PSA    GERD (gastroesophageal reflux disease)    Hemorrhoids    History of blood clots    Hx of adenomatous colonic polyps    Hyperlipidemia    Hypertension    Hypogonadism in male    Past Surgical History:  Procedure Laterality Date   ANAL RECTAL MANOMETRY N/A 09/07/2017   Procedure: ANO RECTAL  MANOMETRY;  Surgeon: Napoleon Form, MD;  Location: [facility];  Service: Endoscopy;  Laterality: N/A;   COLONOSCOPY W/ BIOPSIES     ESOPHAGOGASTRODUODENOSCOPY     Multiple   GIVENS CAPSULE STUDY  05/13/2021   HEMORRHOID BANDING      reports that he quit smoking about 36 years ago. His smoking use included cigarettes. He started smoking about 54 years ago. He has a 9 pack-year smoking history. He has never used smokeless tobacco. He reports current alcohol use. He reports that he does not use drugs. family history includes Alcohol abuse in his father; Emphysema in his father; Melanoma (age of onset: 38) in his brother; Suicidality in his father. No Known Allergies  Review of Systems  Constitutional:  Negative for chills, fever and malaise/fatigue.  Eyes:  Negative for blurred vision.  Respiratory:  Negative for shortness of breath.   Cardiovascular:  Negative for chest pain.  Neurological:  Negative for dizziness, weakness and headaches.      Objective:     BP (!) 142/80 (BP Location: Left Arm, Cuff Size: Normal)   Pulse 87   Temp (!) 97.5 F (36.4 C) (Oral)   Ht 5' 8.75" (1.746 m)   Wt 171 lb 14.4 oz (78 kg)   SpO2 97%   BMI 25.57 kg/m  BP  Readings from Last 3 Encounters:  03/13/24 (!) 142/80  02/21/24 (!) 138/90  01/24/24 130/82   Wt Readings from Last 3 Encounters:  03/13/24 171 lb 14.4 oz (78 kg)  02/21/24 172 lb 8 oz (78.2 kg)  01/24/24 176 lb 8 oz (80.1 kg)      Physical Exam Vitals reviewed.  Constitutional:      General: He is not in acute distress.    Appearance: He is well-developed. He is not ill-appearing.  Eyes:     Pupils: Pupils are equal, round, and reactive to light.  Neck:     Thyroid: No thyromegaly.  Cardiovascular:     Rate and Rhythm: Normal rate and regular rhythm.  Pulmonary:     Effort: Pulmonary effort is normal. No respiratory distress.     Breath sounds: Normal breath sounds. No wheezing or rales.  Musculoskeletal:      Cervical back: Neck supple.  Skin:    Comments: Right posterior thigh is bandaged.  He does have some surrounding erythema and mild warmth.  Nontender.  Neurological:     Mental Status: He is alert and oriented to person, place, and time.      No results found for any visits on 03/13/24.    The 10-year ASCVD risk score (Arnett DK, et al., 2019) is: 29.1%    Assessment & Plan:   #1 abscess right posterior thigh.  Underwent I&D earlier today.  Placed on doxycycline which should offer good staph coverage.  Culture pending.  Follow-up immediately for any fever or progressive erythema or swelling  #2 intermittent neck stiffness and soreness.  Suspect muscular.  Recommend conservative measures with muscle massage and topical sports creams.  Avoid regular use of muscle relaxers given his age  #3 mitral regurgitation murmur noted recent on exam with echo showing myxomatous changes with moderate regurgitation.  He has pending cardiology follow-up.  Does have some dizziness with extreme exertion and not sure if this is related.  No history of syncope.  #4 history of prediabetes range blood sugars with recent A1c 6.3%.  Continue lower glycemic diet and recommend monitoring this every 6 months  Evelena Peat, MD

## 2024-03-13 NOTE — Patient Instructions (Signed)
 Monitor blood pressure and be in touch if consistently > 140/90.

## 2024-04-05 ENCOUNTER — Ambulatory Visit: Payer: Medicare HMO | Admitting: Podiatry

## 2024-04-08 ENCOUNTER — Other Ambulatory Visit: Payer: Self-pay | Admitting: Family Medicine

## 2024-04-20 ENCOUNTER — Telehealth: Payer: Self-pay

## 2024-04-20 ENCOUNTER — Ambulatory Visit (INDEPENDENT_AMBULATORY_CARE_PROVIDER_SITE_OTHER): Admitting: Family Medicine

## 2024-04-20 ENCOUNTER — Encounter: Payer: Self-pay | Admitting: Family Medicine

## 2024-04-20 ENCOUNTER — Other Ambulatory Visit (HOSPITAL_COMMUNITY): Payer: Self-pay

## 2024-04-20 VITALS — BP 136/74 | HR 89 | Temp 98.4°F | Wt 174.9 lb

## 2024-04-20 DIAGNOSIS — R42 Dizziness and giddiness: Secondary | ICD-10-CM | POA: Diagnosis not present

## 2024-04-20 DIAGNOSIS — I3489 Other nonrheumatic mitral valve disorders: Secondary | ICD-10-CM

## 2024-04-20 DIAGNOSIS — R112 Nausea with vomiting, unspecified: Secondary | ICD-10-CM

## 2024-04-20 MED ORDER — ONDANSETRON 8 MG PO TBDP: 8.0000 mg | ORAL_TABLET | Freq: Three times a day (TID) | ORAL | 0 refills | Status: DC | PRN

## 2024-04-20 NOTE — Progress Notes (Signed)
 Established Patient Office Visit  Subjective   Patient ID: Daniel Sullivan, male    DOB: Sep 29, 1951  Age: 73 y.o. MRN: [id number]  Chief Complaint  Patient presents with   Dizziness    Patient complains of dizziness, x2 months    Emesis    Patient complains of emesis that started today    Chills    Patient complains of chills, x1 day     HPI   Mr. Swiss is seen with episode of some nausea and vomiting this morning which was very transient.  He states he ate [place] food last night and wonders if his vomiting may have been related to that.  The other folks he ate with have not had similar symptoms.  He states in the past he has had paroxysms of coughing that have trigger vomiting.  He has no nausea or vomiting whatsoever in the past several hours.  No diarrhea.  Has had tendency to have some reflux in the past and takes omeprazole  regularly.  Has had some heartburn symptoms today intermittently.  He has had longstanding history of intermittent dizziness.  This sounds more like vertigo.  Very transient and triggered by position change such as seated to supine or sitting upward.  This usually goes away within seconds.  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  He was unable to get cardiology appointment until sometime in July.  He does have upcoming travel May 21 through June 5.  He is requesting to see if we get this moved up earlier.  Denies any recent chest pains.  No peripheral edema.  No orthopnea.  Still doing some cycling and has some dyspnea- especially on hills.  No chest pains.   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   Diverticulosis    Elevated PSA    GERD (gastroesophageal reflux disease)    Hemorrhoids    History of blood clots    Hx of adenomatous colonic polyps    Hyperlipidemia     Hypertension    Hypogonadism in male    Past Surgical History:  Procedure Laterality Date   ANAL RECTAL MANOMETRY N/A 09/07/2017   Procedure: ANO RECTAL MANOMETRY;  Surgeon: Sergio Dandy, MD;  Location: [facility];  Service: Endoscopy;  Laterality: N/A;   COLONOSCOPY W/ BIOPSIES     ESOPHAGOGASTRODUODENOSCOPY     Multiple   GIVENS CAPSULE STUDY  05/13/2021   HEMORRHOID BANDING      reports that he quit smoking about 36 years ago. His smoking use included cigarettes. He started smoking about 54 years ago. He has a 9 pack-year smoking history. He has never used smokeless tobacco. He reports current alcohol use. He reports that he does not use drugs. family history includes Alcohol abuse in his father; Emphysema in his father; Melanoma (age of onset: 26) in his brother; Suicidality in his father. No Known Allergies  Review of Systems  Constitutional:  Negative for chills, fever and weight loss.  Respiratory:  Negative for cough and shortness of breath.   Cardiovascular:  Negative for chest pain.  Gastrointestinal:  Negative for blood in stool, constipation, diarrhea and melena.       See HPI   Genitourinary:  Negative for dysuria.  Neurological:  Positive for dizziness.  Negative for loss of consciousness.      Objective:     BP 136/74 (BP Location: Left Arm, Patient Position: Sitting, Cuff Size: Normal)   Pulse 89   Temp 98.4 F (36.9 C) (Oral)   Wt 174 lb 14.4 oz (79.3 kg)   SpO2 95%   BMI 26.02 kg/m  BP Readings from Last 3 Encounters:  04/20/24 136/74  03/13/24 (!) 142/80  02/21/24 (!) 138/90   Wt Readings from Last 3 Encounters:  04/20/24 174 lb 14.4 oz (79.3 kg)  03/13/24 171 lb 14.4 oz (78 kg)  02/21/24 172 lb 8 oz (78.2 kg)      Physical Exam Vitals reviewed.  Constitutional:      General: He is not in acute distress.    Appearance: He is not ill-appearing or toxic-appearing.  Cardiovascular:     Rate and Rhythm: Normal rate and regular  rhythm.     Heart sounds: Murmur heard.     Comments: 2 or 6 systolic murmur mitral region-heard best near the axillary space Pulmonary:     Effort: Pulmonary effort is normal.     Breath sounds: Normal breath sounds. No wheezing.  Abdominal:     General: Bowel sounds are normal.     Palpations: Abdomen is soft. There is no mass.     Tenderness: There is no abdominal tenderness. There is no guarding.  Musculoskeletal:     Right lower leg: No edema.     Left lower leg: No edema.  Neurological:     Mental Status: He is alert.      No results found for any visits on 04/20/24.    The 10-year ASCVD risk score (Arnett DK, et al., 2019) is: 27.3%    Assessment & Plan:   #1 transient nausea vomiting this morning.  He thinks this was related to something he had eaten.  Does have longstanding history of reflux.  Asymptomatic at this time.  He has kept down fluids and has eaten through the day and done well.  No recent hematemesis or any other red flags such as appetite change, weight change, fever, diarrhea, etc.  Observe for now.  We did write prescription for ondansetron  8 mg ODT to use 1 every 8 hours as needed for nausea or vomiting.  Follow-up for any recurrent vomiting or other concerns  #2 intermittent dizziness.  When going from seated to supine he had very transient dizziness only lasting a few seconds.  This sounded more vertigo related.  He has had similar symptoms in the past.  Nonfocal neuro-exam  #3 mitral murmur with myxomatous valve and moderate regurg on recent echo.  Reviewed things to watch for such as dizziness, shortness of breath, peripheral edema, orthopnea.  He has pending cardiology appointment July 21 and would like to see if this can be moved up.  We will inquire regarding this  Glean Lamy, MD

## 2024-04-20 NOTE — Telephone Encounter (Signed)
 Pharmacy Patient Advocate Encounter   Received notification from Patient Pharmacy that prior authorization for Zofran ODT 8 is required/requested.   Insurance verification completed.   The patient is insured through [facility] ADVANTAGE/[employer] .   Per test claim: PA required; PA submitted to above mentioned insurance via CoverMyMeds Key/confirmation #/EOC [id number] Status is pending

## 2024-04-22 ENCOUNTER — Other Ambulatory Visit: Payer: Self-pay | Admitting: Family Medicine

## 2024-04-23 ENCOUNTER — Other Ambulatory Visit: Payer: Self-pay | Admitting: Family Medicine

## 2024-04-24 ENCOUNTER — Other Ambulatory Visit (HOSPITAL_COMMUNITY): Payer: Self-pay

## 2024-04-24 ENCOUNTER — Other Ambulatory Visit: Payer: Self-pay | Admitting: Family Medicine

## 2024-04-24 NOTE — Telephone Encounter (Signed)
 Prior Authorization form/request asks a question that requires your assistance. Please see the question below and advise accordingly. The PA will not be submitted until the necessary information is received. 04/24/24 Spoke with Mrityurnjay at [employer]. Claim was denied on 04/20/24 because they approve ondansetron  for chemotherapy or post surgery anesthesia reaction only. Will need additional chart notes to support if  resubmit.

## 2024-05-07 ENCOUNTER — Other Ambulatory Visit: Payer: Self-pay | Admitting: Family Medicine

## 2024-06-12 NOTE — Progress Notes (Signed)
 Referring-Bruce Burchett, MD Reason for referral-mitral regurgitation  HPI: 73 year old male for evaluation of mitral regurgitation at request of Wolm Lovings, MD.  Stress echocardiogram November 2023 showed no ischemia.  Chest CT August 2024 showed three-vessel coronary calcification.  Echocardiogram March 2025 showed normal LV function, mild left ventricular enlargement, grade 1 diastolic dysfunction, mild left atrial enlargement, moderate mitral regurgitation secondary to prolapse of both mitral valve leaflets and trace aortic insufficiency.  Cardiology now asked to evaluate.  Patient has dyspnea with more vigorous activities but not routine activities.  No orthopnea, PND, pedal edema, chest pain or syncope.  He has had difficulties with chronic dizziness.  Occasional orthostatic symptoms.  Current Outpatient Medications  Medication Sig Dispense Refill   albuterol  (VENTOLIN  HFA) 108 (90 Base) MCG/ACT inhaler Inhale 2 puffs into the lungs every 4 (four) hours as needed for wheezing or shortness of breath. 18 each 1   amLODipine  (NORVASC ) 5 MG tablet TAKE 1 TABLET BY MOUTH EVERY DAY 90 tablet 0   fluticasone  (FLONASE ) 50 MCG/ACT nasal spray Place 2 sprays into both nostrils daily. 16 g 6   Glucosamine-Chondroit-Vit C-Mn TABS Take 1 tablet by mouth daily.     losartan  (COZAAR ) 50 MG tablet TAKE 1 TABLET BY MOUTH EVERY DAY 90 tablet 1   Multiple Vitamins-Minerals (MENS MULTI VITAMIN & MINERAL PO) Take 1 tablet by mouth daily.     omeprazole  (PRILOSEC) 20 MG capsule TAKE 1 CAPSULE BY MOUTH EVERY DAY 90 capsule 0   pramipexole  (MIRAPEX ) 0.25 MG tablet TAKE 1 TABLET BY MOUTH EVERYDAY AT BEDTIME 90 tablet 1   pravastatin  (PRAVACHOL ) 40 MG tablet TAKE 1 TABLET BY MOUTH EVERY DAY 90 tablet 1   sildenafil  (VIAGRA ) 100 MG tablet TAKE 1/2 TO 1 TABLET BY MOUTH DAILY AS NEEDED FOR ERECTILE DYSFUNCTION 10 tablet 0   vitamin C (ASCORBIC ACID) 500 MG tablet Take 500 mg by mouth daily.     ondansetron   (ZOFRAN -ODT) 8 MG disintegrating tablet Take 1 tablet (8 mg total) by mouth every 8 (eight) hours as needed for nausea or vomiting. 15 tablet 0   No current facility-administered medications for this visit.    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   Diverticulosis    Elevated PSA    GERD (gastroesophageal reflux disease)    Hemorrhoids    History of blood clots    Hx of adenomatous colonic polyps    Hyperlipidemia    Hypertension    Hypogonadism in male     Past Surgical History:  Procedure Laterality Date   ANAL RECTAL MANOMETRY N/A 09/07/2017   Procedure: ANO RECTAL MANOMETRY;  Surgeon: Shila Gustav GAILS, MD;  Location: [facility];  Service: Endoscopy;  Laterality: N/A;   COLONOSCOPY W/ BIOPSIES     ESOPHAGOGASTRODUODENOSCOPY     Multiple   GIVENS CAPSULE STUDY  05/13/2021   HEMORRHOID BANDING      Social History   Socioeconomic History   Marital status: Widowed    Spouse name: Not on file   Number of children: 1   Years of education: Not on file   Highest education level: Bachelor's degree (e.g., BA, AB, BS)  Occupational History   Occupation: retired  Tobacco Use   Smoking status: Former    Current packs/day: 0.00    Average packs/day:  0.5 packs/day for 18.0 years (9.0 ttl pk-yrs)    Types: Cigarettes    Start date: 02/23/1970    Quit date: 02/24/1988    Years since quitting: 36.3   Smokeless tobacco: Never  Vaping Use   Vaping status: Never Used  Substance and Sexual Activity   Alcohol use: Yes    Comment: occasionally   Drug use: No   Sexual activity: Yes    Partners: Female  Other Topics Concern   Not on file  Social History Narrative   Married, retired   Former smoker   + RtOH, no drugs   Social Drivers of Corporate investment banker Strain: Low Risk  (04/06/2023)   Overall Financial Resource Strain (CARDIA)    Difficulty of  Paying Living Expenses: Not very hard  Food Insecurity: No Food Insecurity (04/06/2023)   Hunger Vital Sign    Worried About Running Out of Food in the Last Year: Never true    Ran Out of Food in the Last Year: Never true  Transportation Needs: Patient Declined (04/06/2023)   PRAPARE - Transportation    Lack of Transportation (Medical): Patient declined    Lack of Transportation (Non-Medical): Patient declined  Physical Activity: Insufficiently Active (02/01/2022)   Exercise Vital Sign    Days of Exercise per Week: 3 days    Minutes of Exercise per Session: 40 min  Stress: No Stress Concern Present (02/01/2022)   [employer] of Occupational Health - Occupational Stress Questionnaire    Feeling of Stress : Only a little  Social Connections: Unknown (04/06/2023)   Social Connection and Isolation Panel    Frequency of Communication with Friends and Family: Twice a week    Frequency of Social Gatherings with Friends and Family: More than three times a week    Attends Religious Services: Patient declined    Database administrator or Organizations: No    Attends Banker Meetings: Not on file    Marital Status: Widowed  Catering manager Violence: Not on file    Family History  Problem Relation Age of Onset   Alcohol abuse Father    Suicidality Father    Emphysema Father    Melanoma Brother 14   Stomach cancer Neg Hx    Colon cancer Neg Hx    Rectal cancer Neg Hx    Esophageal cancer Neg Hx     ROS: no fevers or chills, productive cough, hemoptysis, dysphasia, odynophagia, melena, hematochezia, dysuria, hematuria, rash, seizure activity, orthopnea, PND, pedal edema, claudication. Remaining systems are negative.  Physical Exam:   Blood pressure (!) 160/90, pulse 81, height 5' 9.5 (1.765 m), weight 172 lb 3.2 oz (78.1 kg), SpO2 94%.  General:  Well developed/well nourished in NAD Skin warm/dry Patient not depressed No peripheral  clubbing Back-normal HEENT-normal/normal eyelids Neck supple/normal carotid upstroke bilaterally; bilateral bruits; no JVD; no thyromegaly chest - CTA/ normal expansion CV - RRR/normal S1 and S2; no rubs or gallops;  PMI nondisplaced; 2/6 systolic murmur apex. Abdomen -NT/ND, no HSM, no mass, + bowel sounds, positive bruit 2+ femoral pulses, no bruits Ext-no edema, chords, 2+ DP Neuro-grossly nonfocal  EKG Interpretation Date/Time:  Monday June 25 2024 16:28:06 EDT Ventricular Rate:  80 PR Interval:  176 QRS Duration:  90 QT Interval:  392 QTC Calculation: 452 R Axis:   25  Text Interpretation: Normal sinus rhythm Normal ECG Confirmed by Pietro Rogue ([id number]) on 06/25/2024 4:29:04 PM     A/P  1 mitral regurgitation-patient  noted to have mitral valve prolapse with moderate mitral regurgitation on most recent echocardiogram.  Will repeat study March 2026.  Presently asymptomatic.  2 coronary calcification-continue aspirin 81 mg daily.  Continue statin.  Previous functional study showed no ischemia.  3 hypertension-blood pressure elevated.  Increase losartan  to 100 mg daily.  Check potassium and renal function in 1 week.  4 hyperlipidemia-most recent LDL March 2025 125.  Given history of coronary calcification we will plan more aggressive measures.  Discontinue pravastatin .  Begin Crestor  40 mg daily.  Check lipids and liver in 8 weeks.  5 carotid bruits-schedule carotid Dopplers to rule out significant disease.  6 abdominal bruit-schedule abdominal ultrasound to exclude aneurysm.  Redell Shallow, MD

## 2024-06-25 ENCOUNTER — Ambulatory Visit: Attending: Cardiology | Admitting: Cardiology

## 2024-06-25 ENCOUNTER — Encounter: Payer: Self-pay | Admitting: Cardiology

## 2024-06-25 VITALS — BP 160/90 | HR 81 | Ht 69.5 in | Wt 172.2 lb

## 2024-06-25 DIAGNOSIS — R0989 Other specified symptoms and signs involving the circulatory and respiratory systems: Secondary | ICD-10-CM | POA: Diagnosis not present

## 2024-06-25 DIAGNOSIS — R011 Cardiac murmur, unspecified: Secondary | ICD-10-CM

## 2024-06-25 DIAGNOSIS — I10 Essential (primary) hypertension: Secondary | ICD-10-CM

## 2024-06-25 DIAGNOSIS — I340 Nonrheumatic mitral (valve) insufficiency: Secondary | ICD-10-CM

## 2024-06-25 DIAGNOSIS — I2510 Atherosclerotic heart disease of native coronary artery without angina pectoris: Secondary | ICD-10-CM

## 2024-06-25 DIAGNOSIS — Z136 Encounter for screening for cardiovascular disorders: Secondary | ICD-10-CM

## 2024-06-25 DIAGNOSIS — E7800 Pure hypercholesterolemia, unspecified: Secondary | ICD-10-CM | POA: Diagnosis not present

## 2024-06-25 MED ORDER — LOSARTAN POTASSIUM 100 MG PO TABS: 100.0000 mg | ORAL_TABLET | Freq: Every day | ORAL | 3 refills | Status: AC

## 2024-06-25 MED ORDER — ROSUVASTATIN CALCIUM 40 MG PO TABS: 40.0000 mg | ORAL_TABLET | Freq: Every day | ORAL | 3 refills | Status: AC

## 2024-06-25 NOTE — Patient Instructions (Signed)
 Medication Instructions:  STOP PRAVASTATIN   START ROSUVASTATIN  40 MG ONCE DAILY  INCREASE LOSARTAN  TO 100 MG ONCE DAILY= 2 OF THE 50 MG TABLETS ONCE DAILY  *If you need a refill on your cardiac medications before your next appointment, please call your pharmacy*  Lab Work:  Your physician recommends that you return for lab work in: ONE WEEK-DO NOT NEED TO FAST  Your physician recommends that you return for lab work in: 8 [facility]  If you have labs (blood work) drawn today and your tests are completely normal, you will receive your results only by: [employer] (if you have MyChart) OR A paper copy in the mail If you have any lab test that is abnormal or we need to change your treatment, we will call you to review the results.  Testing/Procedures:  Your physician has requested that you have a carotid duplex. This test is an ultrasound of the carotid arteries in your neck. It looks at blood flow through these arteries that supply the brain with blood. Allow one hour for this exam. There are no restrictions or special instructions. [address]  Your physician has requested that you have an abdominal aorta duplex. During this test, an ultrasound is used to evaluate the aorta. Allow 30 minutes for this exam. Do not eat after midnight the day before and avoid carbonated beverages.  Please note: We ask at that you not bring children with you during ultrasound (echo/ vascular) testing. Due to room size and safety concerns, children are not allowed in the ultrasound rooms during exams. Our front office staff cannot provide observation of children in our lobby area while testing is being conducted. An adult accompanying a patient to their appointment will only be allowed in the ultrasound room at the discretion of the ultrasound technician under special circumstances. We apologize for any inconvenience. [address]  Follow-Up: At [facility], you and your health needs  are our priority.  As part of our continuing mission to provide you with exceptional heart care, our providers are all part of one team.  This team includes your primary Cardiologist (physician) and Advanced Practice Providers or APPs (Physician Assistants and Nurse Practitioners) who all work together to provide you with the care you need, when you need it.  Your next appointment:   6 month(s)  Provider:   REDELL SHALLOW MD

## 2024-07-03 ENCOUNTER — Other Ambulatory Visit: Payer: Self-pay | Admitting: Family Medicine

## 2024-07-03 DIAGNOSIS — Z08 Encounter for follow-up examination after completed treatment for malignant neoplasm: Secondary | ICD-10-CM | POA: Diagnosis not present

## 2024-07-03 DIAGNOSIS — L821 Other seborrheic keratosis: Secondary | ICD-10-CM | POA: Diagnosis not present

## 2024-07-03 DIAGNOSIS — L578 Other skin changes due to chronic exposure to nonionizing radiation: Secondary | ICD-10-CM | POA: Diagnosis not present

## 2024-07-03 DIAGNOSIS — L814 Other melanin hyperpigmentation: Secondary | ICD-10-CM | POA: Diagnosis not present

## 2024-07-03 DIAGNOSIS — I10 Essential (primary) hypertension: Secondary | ICD-10-CM | POA: Diagnosis not present

## 2024-07-03 DIAGNOSIS — L570 Actinic keratosis: Secondary | ICD-10-CM | POA: Diagnosis not present

## 2024-07-03 DIAGNOSIS — Z85828 Personal history of other malignant neoplasm of skin: Secondary | ICD-10-CM | POA: Diagnosis not present

## 2024-07-03 DIAGNOSIS — D225 Melanocytic nevi of trunk: Secondary | ICD-10-CM | POA: Diagnosis not present

## 2024-07-03 DIAGNOSIS — Z09 Encounter for follow-up examination after completed treatment for conditions other than malignant neoplasm: Secondary | ICD-10-CM | POA: Diagnosis not present

## 2024-07-03 LAB — BASIC METABOLIC PANEL WITH GFR
BUN/Creatinine Ratio: 14 (ref 10–24)
BUN: 14 mg/dL (ref 8–27)
CO2: 25 mmol/L (ref 20–29)
Calcium: 9.1 mg/dL (ref 8.6–10.2)
Chloride: 96 mmol/L (ref 96–106)
Creatinine, Ser: 0.98 mg/dL (ref 0.76–1.27)
Glucose: 135 mg/dL — ABNORMAL HIGH (ref 70–99)
Potassium: 4.9 mmol/L (ref 3.5–5.2)
Sodium: 138 mmol/L (ref 134–144)
eGFR: 81 mL/min/1.73 (ref >59)

## 2024-07-04 ENCOUNTER — Ambulatory Visit: Payer: Self-pay | Admitting: Cardiology

## 2024-07-05 ENCOUNTER — Other Ambulatory Visit: Payer: Self-pay | Admitting: Family Medicine

## 2024-07-10 ENCOUNTER — Telehealth: Payer: Self-pay | Admitting: Podiatry

## 2024-07-10 NOTE — Telephone Encounter (Signed)
 Left message to confirm appointment cancellation for 07/17/2024.

## 2024-07-11 NOTE — Telephone Encounter (Signed)
 Called patient again to confirm cancellation.

## 2024-07-13 NOTE — Telephone Encounter (Signed)
 Left message for patient that after 3 attempts I was going to go ahead and cancel his appointment per his response to automated system.

## 2024-07-17 ENCOUNTER — Ambulatory Visit: Admitting: Podiatry

## 2024-07-19 ENCOUNTER — Encounter: Payer: Self-pay | Admitting: Urology

## 2024-07-20 ENCOUNTER — Ambulatory Visit (HOSPITAL_BASED_OUTPATIENT_CLINIC_OR_DEPARTMENT_OTHER)
Admission: RE | Admit: 2024-07-20 | Discharge: 2024-07-20 | Disposition: A | Discharge status: Home / Self Care | Source: Ambulatory Visit | Attending: Cardiology | Admitting: Cardiology

## 2024-07-20 ENCOUNTER — Ambulatory Visit (HOSPITAL_COMMUNITY)
Admission: RE | Admit: 2024-07-20 | Discharge: 2024-07-20 | Disposition: A | Discharge status: Home / Self Care | Source: Ambulatory Visit | Attending: Cardiology | Admitting: Cardiology

## 2024-07-20 DIAGNOSIS — Z87891 Personal history of nicotine dependence: Secondary | ICD-10-CM | POA: Diagnosis not present

## 2024-07-20 DIAGNOSIS — R0989 Other specified symptoms and signs involving the circulatory and respiratory systems: Secondary | ICD-10-CM | POA: Diagnosis not present

## 2024-07-20 DIAGNOSIS — Z136 Encounter for screening for cardiovascular disorders: Secondary | ICD-10-CM | POA: Insufficient documentation

## 2024-07-27 ENCOUNTER — Other Ambulatory Visit: Payer: Self-pay | Admitting: Family Medicine

## 2024-07-27 DIAGNOSIS — J209 Acute bronchitis, unspecified: Secondary | ICD-10-CM

## 2024-08-06 ENCOUNTER — Other Ambulatory Visit: Payer: Self-pay | Admitting: Family Medicine

## 2024-08-13 DIAGNOSIS — C61 Malignant neoplasm of prostate: Secondary | ICD-10-CM | POA: Insufficient documentation

## 2024-08-13 NOTE — Assessment & Plan Note (Addendum)
 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 definitive therapy  - Reviewed pros and cons of interval MRI versus biopsy.  Considering negative MRI last year, and over 3 years since biopsy--Will proceed with 12 core systematic TRUS biopsy -P.o. Levaquin  and enema prescribed, instructions provided

## 2024-08-13 NOTE — Progress Notes (Signed)
   08/23/2024 10:21 AM   Daniel Sullivan 1951/05/31 [id number]  Reason for visit: Follow up low risk prostate cancer   HPI: Initial follow-up with me, previously followed by Dr. Penne, last seen February 2025  Prior HPI: 10/21/2019-12 core TRUS biopsy- Gleason 3+3=6 involving 1 core at the right apex affecting 14%. TRUS 56.6 cc.   Prostate MRI (12/21/2022)-negative  Physical Exam: BP (!) 164/86   Pulse 94   Ht 5' 10 (1.778 m)   Wt 173 lb (78.5 kg)   BMI 24.82 kg/m    Constitutional:  Alert and oriented, No acute distress.   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 definitive therapy  - Reviewed pros and cons of interval MRI versus biopsy.  Considering negative MRI last year, and over 3 years since biopsy--Will proceed with 12 core systematic TRUS biopsy -P.o. Levaquin  and enema prescribed, instructions provided   Other orders -     levoFLOXacin ; Take the day before, morning of, and day after your prostate biopsy  Dispense: 3 tablet; Refill: 0 -     Fleet Enema; Perform the morning of your prostate biopsy procedure  Dispense: 133 mL; Refill: 0       Penne JONELLE Skye, MD  [facility] [address] [place], [place] [address] [phone]

## 2024-08-14 ENCOUNTER — Other Ambulatory Visit

## 2024-08-14 ENCOUNTER — Other Ambulatory Visit: Payer: Self-pay

## 2024-08-14 DIAGNOSIS — R972 Elevated prostate specific antigen [PSA]: Secondary | ICD-10-CM

## 2024-08-16 ENCOUNTER — Other Ambulatory Visit

## 2024-08-16 DIAGNOSIS — R972 Elevated prostate specific antigen [PSA]: Secondary | ICD-10-CM

## 2024-08-17 LAB — PSA: Prostate Specific Ag, Serum: 8.9 ng/mL — ABNORMAL HIGH (ref 0.0–4.0)

## 2024-08-21 ENCOUNTER — Ambulatory Visit: Admitting: Urology

## 2024-08-21 DIAGNOSIS — E7800 Pure hypercholesterolemia, unspecified: Secondary | ICD-10-CM | POA: Diagnosis not present

## 2024-08-22 LAB — HEPATIC FUNCTION PANEL
ALT: 21 IU/L (ref 0–44)
AST: 33 IU/L (ref 0–40)
Albumin: 4.5 g/dL (ref 3.8–4.8)
Alkaline Phosphatase: 66 IU/L (ref 47–123)
Bilirubin Total: 0.3 mg/dL (ref 0.0–1.2)
Bilirubin, Direct: 0.12 mg/dL (ref 0.00–0.40)
Total Protein: 7.1 g/dL (ref 6.0–8.5)

## 2024-08-22 LAB — LIPID PANEL
Chol/HDL Ratio: 2.5 ratio (ref 0.0–5.0)
Cholesterol, Total: 152 mg/dL (ref 100–199)
HDL: 61 mg/dL (ref >39)
LDL Chol Calc (NIH): 64 mg/dL (ref 0–99)
Triglycerides: 162 mg/dL — ABNORMAL HIGH (ref 0–149)
VLDL Cholesterol Cal: 27 mg/dL (ref 5–40)

## 2024-08-23 ENCOUNTER — Ambulatory Visit: Admitting: Urology

## 2024-08-23 VITALS — BP 164/86 | HR 94 | Ht 70.0 in | Wt 173.0 lb

## 2024-08-23 DIAGNOSIS — C61 Malignant neoplasm of prostate: Secondary | ICD-10-CM | POA: Diagnosis not present

## 2024-08-23 MED ORDER — LEVOFLOXACIN 500 MG PO TABS: ORAL_TABLET | ORAL | 0 refills | Status: DC

## 2024-08-23 MED ORDER — FLEET ENEMA RE ENEM: ENEMA | RECTAL | 0 refills | Status: AC

## 2024-08-23 NOTE — Patient Instructions (Signed)
 Prostate Biopsy Instructions  Stop all aspirin or blood thinners (aspirin, plavix, coumadin, warfarin, motrin, ibuprofen, advil, aleve, naproxen, naprosyn) for 7 days prior to the procedure.  If you have any questions about stopping these medications, please contact your primary care physician or cardiologist.  Having a light meal prior to the procedure is recommended.  If you are diabetic or have low blood sugar please bring a small snack or glucose tablet.  A Fleets enema is needed to be purchased over the counter at a local pharmacy and used 2 hours before you scheduled appointment.  This can be purchased over the counter at any pharmacy.  Antibiotics will be administered in the clinic at the time of the procedure unless otherwise specified.    Please bring someone with you to the procedure to drive you home.  A follow up appointment has been scheduled for you to receive the results of the biopsy.  If you have any questions or concerns, please feel free to call the office at [phone] or send a Mychart message.    Thank you, Staff at [facility]

## 2024-08-24 ENCOUNTER — Encounter: Payer: Self-pay | Admitting: Family Medicine

## 2024-08-27 ENCOUNTER — Other Ambulatory Visit: Payer: Self-pay | Admitting: Family Medicine

## 2024-09-13 ENCOUNTER — Other Ambulatory Visit: Admitting: Urology

## 2024-09-20 ENCOUNTER — Other Ambulatory Visit: Admitting: Urology

## 2024-09-24 ENCOUNTER — Other Ambulatory Visit: Payer: Self-pay | Admitting: Family Medicine

## 2024-09-27 ENCOUNTER — Ambulatory Visit (INDEPENDENT_AMBULATORY_CARE_PROVIDER_SITE_OTHER): Admitting: Family Medicine

## 2024-09-27 VITALS — Wt 168.0 lb

## 2024-09-27 DIAGNOSIS — Z0000 Encounter for general adult medical examination without abnormal findings: Secondary | ICD-10-CM | POA: Diagnosis not present

## 2024-09-27 NOTE — Progress Notes (Signed)
 PATIENT CHECK-IN and HEALTH RISK ASSESSMENT QUESTIONNAIRE:  -completed by phone/video for upcoming Medicare Preventive Visit  Pre-Visit Check-in: 1)Vitals (height, wt, BP, etc) - record in vitals section for visit on day of visit Request home vitals (wt, BP, etc.) and enter into vitals, THEN update Vital Signs SmartPhrase below at the top of the HPI. See below.  2)Review and Update Medications, Allergies PMH, Surgeries, Social history in Epic 3)Hospitalizations in the last year with date/reason? n  4)Review and Update Care Team (patient's specialists) in Epic 5) Complete PHQ9 in Epic  6) Complete Fall Screening in Epic 7)Review all Health Maintenance Due and order if not done.  Medicare Wellness Patient Questionnaire:  Answer theses question about your habits: How often do you have a drink containing alcohol?daily How many drinks containing alcohol do you have on a typical day when you are drinking? 1-2 How often do you have six or more drinks on one occasion? Very rare - a few times a year Have you ever smoked?y Quit date if applicable? 1989  How many packs a day do/did you smoke? 1/2 ppd Do you use smokeless tobacco?n Do you use an illicit drugs? Sometimes THC gummies - 1x per week On average, how many days per week do you engage in moderate to strenuous exercise (like a brisk walk)?3-4 days week, Are you having any difficulty walking, taking medications on your own, and or difficulty managing daily home needs?walking, biking, machines. Typical breakfast: post oats and fruit, frozen blueberries Typical lunch: leftovers or sandwich Typical dinner: chicken livers, potatoes and cheese Typical snacks:occ desert, occ protein bar  Beverages: coffee, water Social connections: great, on biking teams, goes to gym, [employer]  Answer theses question about your everyday activities: Can you perform most household chores?y Are you deaf or have significant trouble hearing?has hearing aids Do you  feel that you have a problem with memory?n Do you feel safe at home?y Last dentist visit?went last week 8. Do you have any difficulty performing your everyday activities? n Do you have difficulty walking or climbing stairs?n Do you have difficulty dressing or bathing?n Do you have difficulty doing errands alone such as visiting a doctor's office or shopping?n Do you currently have any difficulty preparing food and eating?n Do you currently have any difficulty using the toilet?n Do you have any difficulty managing your finances?n Do you have any difficulties with housekeeping of managing your housekeeping?n   Do you have Advanced Directives in place (Living Will, Healthcare Power or Attorney)? y   Last eye Exam and location? Gets at annual physical, but does not see eye doctor every year   Do you currently use prescribed or non-prescribed narcotic or opioid pain medications?n  Do you have a history or close family history of breast, ovarian, tubal or peritoneal cancer or a family member with BRCA (breast cancer susceptibility 1 and 2) gene mutations?     ----------------------------------------------------------------------------------------------------------------------------------------------------------------------------------------------------------------------  Because this visit was a virtual/telehealth visit, some criteria may be missing or patient reported. Any vitals not documented were not able to be obtained and vitals that have been documented are patient reported.    MEDICARE ANNUAL PREVENTIVE CARE VISIT WITH PROVIDER (Welcome to Medicare, initial annual wellness or annual wellness exam)  Virtual Visit via Video Note  I connected with Daniel Sullivan on 09/27/24  by  a video enabled telemedicine application and verified that I am speaking with the correct person using two identifiers.  Location patient: home Location provider:work or home office Persons participating  in the virtual visit: patient, provider  Concerns and/or follow up today: reports stable - has some chronic SOB and reports saw Dr. Pietro and and PCP about this and was told is mild asthma. He had lung imaging and cardiology eval. Alb helps a little. He feels nothing has really be done about this.    See HM section in Epic for other details of completed HM.    ROS: negative for report of fevers, unintentional weight loss, vision changes, vision loss, hearing loss or change, chest pain,  hemoptysis, melena, hematochezia, hematuria, falls, bleeding or bruising, thoughts of suicide or self harm, memory loss  Patient-completed extensive health risk assessment - reviewed and discussed with the patient: See Health Risk Assessment completed with patient prior to the visit either above or in recent phone note. This was reviewed in detailed with the patient today and appropriate recommendations, orders and referrals were placed as needed per Summary below and patient instructions.   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   Diverticulosis    Elevated PSA    GERD (gastroesophageal reflux disease)    Hemorrhoids    History of blood clots    Hx of adenomatous colonic polyps    Hyperlipidemia    Hypertension    Hypogonadism in male     Past Surgical History:  Procedure Laterality Date   ANAL RECTAL MANOMETRY N/A 09/07/2017   Procedure: ANO RECTAL MANOMETRY;  Surgeon: Shila Gustav GAILS, MD;  Location: [facility];  Service: Endoscopy;  Laterality: N/A;   COLONOSCOPY W/ BIOPSIES     ESOPHAGOGASTRODUODENOSCOPY     Multiple   GIVENS CAPSULE STUDY  05/13/2021   HEMORRHOID BANDING      Social History    Socioeconomic History   Marital status: Widowed    Spouse name: Not on file   Number of children: 1   Years of education: Not on file   Highest education level: Bachelor's degree (e.g., BA, AB, BS)  Occupational History   Occupation: retired  Tobacco Use   Smoking status: Former    Current packs/day: 0.00    Average packs/day: 0.5 packs/day for 18.0 years (9.0 ttl pk-yrs)    Types: Cigarettes    Start date: 02/23/1970    Quit date: 02/24/1988    Years since quitting: 36.6   Smokeless tobacco: Never  Vaping Use   Vaping status: Never Used  Substance and Sexual Activity   Alcohol use: Yes    Comment: occasionally   Drug use: No   Sexual activity: Yes    Partners: Female  Other Topics Concern   Not on file  Social History Narrative   Married, retired   Former smoker   + RtOH, no drugs   Social Drivers of Corporate investment banker Strain: Low Risk  (04/06/2023)   Overall Financial Resource Strain (CARDIA)    Difficulty of Paying Living Expenses: Not very hard  Food Insecurity: No Food Insecurity (04/06/2023)   Hunger Vital Sign    Worried About Running Out of Food in the Last Year: Never true    Ran Out of Food in the Last Year: Never true  Transportation Needs: Patient Declined (04/06/2023)   PRAPARE - Administrator, Civil Service (Medical): Patient declined    Lack of Transportation (Non-Medical): Patient declined  Physical Activity: Insufficiently Active (02/01/2022)   Exercise Vital Sign    Days of Exercise per Week: 3 days    Minutes of Exercise per Session: 40 min  Stress: No Stress Concern Present (02/01/2022)   [employer] of Occupational Health - Occupational Stress Questionnaire    Feeling of Stress : Only a little  Social Connections: Unknown (04/06/2023)   Social Connection and Isolation Panel    Frequency of Communication with Friends and Family: Twice a week    Frequency of Social Gatherings with Friends and Family: More than three times a  week    Attends Religious Services: Patient declined    Database administrator or Organizations: No    Attends Banker Meetings: Not on file    Marital Status: Widowed  Catering manager Violence: Not on file    Family History  Problem Relation Age of Onset   Alcohol abuse Father    Suicidality Father    Emphysema Father    Melanoma Brother 73   Stomach cancer Neg Hx    Colon cancer Neg Hx    Rectal cancer Neg Hx    Esophageal cancer Neg Hx     Current Outpatient Medications on File Prior to Visit  Medication Sig Dispense Refill   albuterol  (VENTOLIN  HFA) 108 (90 Base) MCG/ACT inhaler Inhale 2 puffs into the lungs every 4 (four) hours as needed for wheezing or shortness of breath. 18 each 1   amLODipine  (NORVASC ) 5 MG tablet TAKE 1 TABLET BY MOUTH EVERY DAY 90 tablet 1   fluticasone  (FLONASE ) 50 MCG/ACT nasal spray Place 2 sprays into both nostrils daily. 16 g 6   Glucosamine-Chondroit-Vit C-Mn TABS Take 1 tablet by mouth daily.     levofloxacin  (LEVAQUIN ) 500 MG tablet Take the day before, morning of, and day after your prostate biopsy 3 tablet 0   losartan  (COZAAR ) 100 MG tablet Take 1 tablet (100 mg total) by mouth daily. 90 tablet 3   Multiple Vitamins-Minerals (MENS MULTI VITAMIN & MINERAL PO) Take 1 tablet by mouth daily.     omeprazole  (PRILOSEC) 20 MG capsule TAKE 1 CAPSULE BY MOUTH EVERY DAY 90 capsule 1   rosuvastatin  (CRESTOR ) 40 MG tablet Take 1 tablet (40 mg total) by mouth daily. 90 tablet 3   sildenafil  (VIAGRA ) 100 MG tablet TAKE 1/2 TO 1 TABLET BY MOUTH DAILY AS NEEDED FOR ERECTILE DYSFUNCTION 10 tablet 0   sodium phosphate (FLEET) ENEM Perform the morning of your prostate biopsy procedure 133 mL 0   vitamin C (ASCORBIC ACID) 500 MG tablet Take 500 mg by mouth daily.     No current facility-administered medications on file prior to visit.    No Known Allergies     Physical Exam Vitals requested from patient and listed below if patient had  equipment and was able to obtain at home for this virtual visit: There were no vitals filed for this visit. Estimated body mass index is 24.11 kg/m as calculated from the following:   Height as of 08/23/24: 5' 10 (1.778 m).   Weight as of this encounter: 168 lb (76.2 kg).  EKG (optional): deferred due to virtual visit  GENERAL: alert, oriented, no acute distress detected; full vision exam deferred due to pandemic and/or virtual encounter   HEENT: atraumatic, conjunttiva clear, no obvious  abnormalities on inspection of external nose and ears  NECK: normal movements of the head and neck  LUNGS: on inspection no signs of respiratory distress, breathing rate appears normal, no obvious gross SOB, gasping or wheezing  CV: no obvious cyanosis  MS: moves all visible extremities without noticeable abnormality  PSYCH/NEURO: pleasant and cooperative, no obvious depression or anxiety, speech and thought processing grossly intact, Cognitive function grossly intact  Flowsheet Row Office Visit from 01/24/2024 in [facility]  PHQ-9 Total Score 0        09/27/2024   10:53 AM 01/24/2024    3:50 PM 04/06/2023    1:44 PM 11/10/2021    9:38 AM 12/04/2019   10:41 AM  Depression screen PHQ 2/9  Decreased Interest 0 0 0 0 0  Down, Depressed, Hopeless 0 0 0 0 0  PHQ - 2 Score 0 0 0 0 0  Altered sleeping  0 0  0  Tired, decreased energy  0 0  0  Change in appetite  0 0  0  Feeling bad or failure about yourself   0 0  0  Trouble concentrating  0 0  0  Moving slowly or fidgety/restless  0 0  0  Suicidal thoughts  0 0  0  PHQ-9 Score  0 0  0  Difficult doing work/chores  Not difficult at all   Not difficult at all       04/09/2022    9:54 AM 07/28/2022    8:42 AM 01/05/2024    3:46 PM 01/24/2024    3:50 PM 09/27/2024   10:53 AM  Fall Risk  Falls in the past year? 1 0 0 0 0  Was there an injury with Fall? 1 0 0 0 0  Fall Risk Category Calculator 2 0 0 0 0  Fall  Risk Category (Retired) Moderate  Low      (RETIRED) Patient Fall Risk Level  Low fall risk      Patient at Risk for Falls Due to  No Fall Risks No Fall Risks No Fall Risks   Fall risk Follow up  Falls evaluation completed  Falls evaluation completed Falls evaluation completed Falls evaluation completed     Data saved with a previous flowsheet row definition     SUMMARY AND PLAN:  Encounter for Medicare annual wellness exam  Discussed applicable health maintenance/preventive health measures and advised and referred or ordered per patient preferences: -discussed vaccines due recs/risks, he plans to get at the pharmacy, advised to let us  know when he does so that we can update his record Health Maintenance  Topic Date Due   Influenza Vaccine  07/06/2024   COVID-19 Vaccine (4 - 2025-26 season) 08/06/2024   Medicare Annual Wellness (AWV)  09/27/2025   Colonoscopy  03/20/2030   DTaP/Tdap/Td (3 - Td or Tdap) 02/02/2032   Pneumococcal Vaccine: 50+ Years  Completed   Hepatitis C Screening  Completed   Zoster Vaccines- Shingrix  Completed   Meningococcal B Vaccine  Aged Out     Education and counseling on the following was provided based on the above review of health and a plan/checklist for the patient, along with additional information discussed, was provided for the patient in the patient instructions :  -Advised and counseled on a healthy lifestyle - including the importance of a healthy diet, regular physical activity, social connections  -Reviewed patient's current diet. Advised and counseled on a whole foods based healthy diet. Reviewed metabolic  labs and gave specific evidence based guidelines for improving metabolic health - including reduction in foods with added sugar, processed grains and simple starches and increase in healthy vegetables, protein, fruits. A summary of a healthy diet was provided in the Patient Instructions.  -reviewed patient's current physical activity level and  discussed exercise guidelines for adults. Congratulated on healthy habits. Further resources provided in patient instructions.  -Advise yearly dental visits at minimum and regular eye exams -suggested follow up visit with PCP now that he has seen cardiology to further discuss his breathing issues and may benefit from PFTs/pulm eval. He agrees to schedule.  -Advised and counseled on alcohol safe limits, risks and risks with THC products.  Follow up: see patient instructions   Patient Instructions  I really enjoyed getting to talk with you today! I am available on Tuesdays and Thursdays for virtual visits if you have any questions or concerns, or if I can be of any further assistance.   CHECKLIST FROM ANNUAL WELLNESS VISIT:  -Follow up (please call to schedule if not scheduled after visit):   -yearly for annual wellness visit with primary care office  -please call the office to schedule follow up with Dr. Micheal regarding you breathing concerns.   Here is a list of your preventive care/health maintenance measures and the plan for each if any are due:  PLAN For any measures below that may be due:    1. Can get flu vaccine at the office or at the pharmacy. Can get covid vaccine at the pharmacy. Please let us  know if/when you do so that we can update your record.   Health Maintenance  Topic Date Due   Influenza Vaccine  07/06/2024   COVID-19 Vaccine (4 - 2025-26 season) 08/06/2024   Medicare Annual Wellness (AWV)  09/27/2025   Colonoscopy  03/20/2030   DTaP/Tdap/Td (3 - Td or Tdap) 02/02/2032   Pneumococcal Vaccine: 50+ Years  Completed   Hepatitis C Screening  Completed   Zoster Vaccines- Shingrix  Completed   Meningococcal B Vaccine  Aged Out    -See a dentist at least yearly  -Get your eyes checked and then per your eye specialist's recommendations  -Other issues addressed today:   -I have included below further information regarding a healthy whole foods based diet,  physical activity guidelines for adults, stress management and opportunities for social connections. I hope you find this information useful.   -----------------------------------------------------------------------------------------------------------------------------------------------------------------------------------------------------------------------------------------------------------    NUTRITION: -eat real food: lots of colorful vegetables (half the plate) and fruits -5-7 servings of vegetables and fruits per day (fresh or steamed is best), exp. 2 servings of vegetables with lunch and dinner and 2 servings of fruit per day. Berries and greens such as kale and collards are great choices.  -consume on a regular basis:  fresh fruits, fresh veggies, fish, nuts, seeds, healthy oils (such as olive oil, avocado oil), whole grains (make sure for bread/pasta/crackers/etc., that the first ingredient on label contains the word whole), legumes. -can eat small amounts of dairy and lean meat (no larger than the palm of your hand), but avoid processed meats such as ham, bacon, lunch meat, etc. -drink water -try to avoid fast food and pre-packaged foods, processed meat, ultra processed foods/beverages (donuts, candy, etc.) -most experts advise limiting sodium to < 2300mg  per day, should limit further is any chronic conditions such as high blood pressure, heart disease, diabetes, etc. The American Heart Association advised that < 1500mg  is is ideal -try to avoid foods/beverages  that contain any ingredients with names you do not recognize  -try to avoid foods/beverages  with added sugar or sweeteners/sweets  -try to avoid sweet drinks (including diet drinks): soda, juice, Gatorade, sweet tea, power drinks, diet drinks -try to avoid white rice, white bread, pasta (unless whole grain)  EXERCISE GUIDELINES FOR ADULTS:  -Physical activity guidelines for optimal health in adults: -get at least 150 minutes  per week of moderate exercise (can talk, but not sing); this is about 20-30 minutes of sustained activity 5-7 days per week or two 10-15 minute episodes of sustained activity 5-7 days per week -do some muscle building/resistance training/strength training at least 2 days per week  -balance exercises 3+ days per week:   Stand somewhere where you have something sturdy to hold onto if you lose balance    1) lift up on toes, then back down, start with 5x per day and work up to 20x   2) stand and lift one leg straight out to the side so that foot is a few inches of the floor, start with 5x each side and work up to 20x each side   3) stand on one foot, start with 5 seconds each side and work up to 20 seconds on each side    STRESS MANAGEMENT: -can try meditating, or just sitting quietly with deep breathing while intentionally relaxing all parts of your body for 5 minutes daily -if you need further help with stress, anxiety or depression please follow up with your primary doctor or contact the wonderful folks at [facility]: [phone]          Chiquita JONELLE Cramp, DO

## 2024-09-27 NOTE — Patient Instructions (Addendum)
 I really enjoyed getting to talk with you today! I am available on Tuesdays and Thursdays for virtual visits if you have any questions or concerns, or if I can be of any further assistance.   CHECKLIST FROM ANNUAL WELLNESS VISIT:  -Follow up (please call to schedule if not scheduled after visit):   -yearly for annual wellness visit with primary care office  -please call the office to schedule follow up with Dr. Micheal regarding you breathing concerns.   Here is a list of your preventive care/health maintenance measures and the plan for each if any are due:  PLAN For any measures below that may be due:    1. Can get flu vaccine at the office or at the pharmacy. Can get covid vaccine at the pharmacy. Please let us  know if/when you do so that we can update your record.   Health Maintenance  Topic Date Due   Influenza Vaccine  07/06/2024   COVID-19 Vaccine (4 - 2025-26 season) 08/06/2024   Medicare Annual Wellness (AWV)  09/27/2025   Colonoscopy  03/20/2030   DTaP/Tdap/Td (3 - Td or Tdap) 02/02/2032   Pneumococcal Vaccine: 50+ Years  Completed   Hepatitis C Screening  Completed   Zoster Vaccines- Shingrix  Completed   Meningococcal B Vaccine  Aged Out    -See a dentist at least yearly  -Get your eyes checked and then per your eye specialist's recommendations  -Other issues addressed today:   -I have included below further information regarding a healthy whole foods based diet, physical activity guidelines for adults, stress management and opportunities for social connections. I hope you find this information useful.   -----------------------------------------------------------------------------------------------------------------------------------------------------------------------------------------------------------------------------------------------------------    NUTRITION: -eat real food: lots of colorful vegetables (half the plate) and fruits -5-7 servings of  vegetables and fruits per day (fresh or steamed is best), exp. 2 servings of vegetables with lunch and dinner and 2 servings of fruit per day. Berries and greens such as kale and collards are great choices.  -consume on a regular basis:  fresh fruits, fresh veggies, fish, nuts, seeds, healthy oils (such as olive oil, avocado oil), whole grains (make sure for bread/pasta/crackers/etc., that the first ingredient on label contains the word whole), legumes. -can eat small amounts of dairy and lean meat (no larger than the palm of your hand), but avoid processed meats such as ham, bacon, lunch meat, etc. -drink water -try to avoid fast food and pre-packaged foods, processed meat, ultra processed foods/beverages (donuts, candy, etc.) -most experts advise limiting sodium to < 2300mg  per day, should limit further is any chronic conditions such as high blood pressure, heart disease, diabetes, etc. The American Heart Association advised that < 1500mg  is is ideal -try to avoid foods/beverages that contain any ingredients with names you do not recognize  -try to avoid foods/beverages  with added sugar or sweeteners/sweets  -try to avoid sweet drinks (including diet drinks): soda, juice, Gatorade, sweet tea, power drinks, diet drinks -try to avoid white rice, white bread, pasta (unless whole grain)  EXERCISE GUIDELINES FOR ADULTS:  -Physical activity guidelines for optimal health in adults: -get at least 150 minutes per week of moderate exercise (can talk, but not sing); this is about 20-30 minutes of sustained activity 5-7 days per week or two 10-15 minute episodes of sustained activity 5-7 days per week -do some muscle building/resistance training/strength training at least 2 days per week  -balance exercises 3+ days per week:   Stand somewhere where you have something sturdy  to hold onto if you lose balance    1) lift up on toes, then back down, start with 5x per day and work up to 20x   2) stand and lift  one leg straight out to the side so that foot is a few inches of the floor, start with 5x each side and work up to 20x each side   3) stand on one foot, start with 5 seconds each side and work up to 20 seconds on each side    STRESS MANAGEMENT: -can try meditating, or just sitting quietly with deep breathing while intentionally relaxing all parts of your body for 5 minutes daily -if you need further help with stress, anxiety or depression please follow up with your primary doctor or contact the wonderful folks at [facility]: [phone]

## 2024-09-28 ENCOUNTER — Encounter: Payer: Self-pay | Admitting: Family Medicine

## 2024-09-28 NOTE — Progress Notes (Signed)
   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 A time out was performed to ensure correct patient identity.  Pre-Procedure: - Last PSA Level: 8.9 (Sept 2025) - stable - Gentamicin  and levaquin  given for antibiotic prophylaxis  Procedure: - Periprostatic nerve block performed using 20 cc 1% lidocaine   - Anatomic measurements performed revealing a 44 gm prostate, PSA density 0.20 - No significant hypoechoic lesions or median lobe noted - Standard 12-core systematic biopsy template performed   Total biopsy cores: 12  Post-Procedure: - Patient tolerated the procedure well - He was counseled to seek immediate medical attention if experiences significant bleeding, fevers, urinary retention, or severe pain - Return in one week to discuss biopsy results  Assessment/ Plan: Will follow up in ~7-10 days to discuss pathology  Penne Skye, MD 09/28/2024

## 2024-10-01 ENCOUNTER — Encounter: Payer: Self-pay | Admitting: Family Medicine

## 2024-10-02 ENCOUNTER — Encounter: Payer: Self-pay | Admitting: Family Medicine

## 2024-10-02 ENCOUNTER — Ambulatory Visit (INDEPENDENT_AMBULATORY_CARE_PROVIDER_SITE_OTHER): Admitting: Family Medicine

## 2024-10-02 VITALS — BP 130/70 | HR 93 | Temp 97.4°F | Wt 172.4 lb

## 2024-10-02 DIAGNOSIS — R0609 Other forms of dyspnea: Secondary | ICD-10-CM | POA: Diagnosis not present

## 2024-10-02 DIAGNOSIS — I10 Essential (primary) hypertension: Secondary | ICD-10-CM

## 2024-10-02 NOTE — Telephone Encounter (Signed)
 Noted

## 2024-10-02 NOTE — Progress Notes (Signed)
 Established Patient Office Visit  Subjective   Patient ID: Daniel Sullivan, male    DOB: [birth year]  Age: 73 y.o. MRN: [id number]  Chief Complaint  Patient presents with   Medical Management of Chronic Issues    HPI    Daniel Sullivan is seen for follow-up regarding some ongoing dizziness and shortness of breath with exertion.  He still stays very active with walking and cycling.  Interestingly, he does not seem to have his severe symptoms with cycling as he does with walking.  He particularly notices problems with dyspnea going up things like hills.  Frequently lightheaded.  No history of syncope.  No chest pain.  He wonders if some of this may be reactive airway.  He does have some coughing and intermittent wheezing and recently has been using his albuterol  and has noticed some improvement with his breathing with that.  He quit smoking in 1989.  Smoked approximately 15 years 1/2 pack/day.  Denies any recent pleuritic pain, fever, chills, or hemoptysis.  No appetite or weight changes.  He had stress echo 2013 which showed no ischemia.  He had ultrasound aorta and ultrasound carotids per cardiology back in August which were unremarkable.  He had repeat echo 02-21-2024 which confirmed moderate mitral valve regurgitation after we had noted mitral regurg murmur.  He has not had any recent increased peripheral edema.  No orthopnea.  His chronic problems include history of hypertension, GERD, prostate cancer which has been monitored without active treatment at this time, hyperlipidemia.  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   Diverticulosis    Elevated PSA    GERD (gastroesophageal reflux disease)    Hemorrhoids    History of blood clots    Hx of adenomatous colonic polyps    Hyperlipidemia    Hypertension    Hypogonadism in male    Past Surgical History:  Procedure Laterality Date   ANAL RECTAL  MANOMETRY N/A 09/07/2017   Procedure: ANO RECTAL MANOMETRY;  Surgeon: Shila Gustav GAILS, MD;  Location: [facility];  Service: Endoscopy;  Laterality: N/A;   COLONOSCOPY W/ BIOPSIES     ESOPHAGOGASTRODUODENOSCOPY     Multiple   GIVENS CAPSULE STUDY  05/13/2021   HEMORRHOID BANDING      reports that he quit smoking about 36 years ago. His smoking use included cigarettes. He started smoking about 54 years ago. He has a 9 pack-year smoking history. He has never used smokeless tobacco. He reports current alcohol use. He reports that he does not use drugs. family history includes Alcohol abuse in his father; Emphysema in his father; Melanoma (age of onset: 44) in his brother; Suicidality in his father. No Known Allergies  Review of Systems  Constitutional:  Negative for malaise/fatigue.  Eyes:  Negative for blurred vision.  Respiratory:  Positive for shortness of breath.   Cardiovascular:  Negative for chest pain.  Neurological:  Positive for dizziness. Negative for speech change, focal weakness, seizures, loss of consciousness, weakness and headaches.      Objective:     BP 130/70   Pulse 93   Temp (!) 97.4 F (36.3 C) (Oral)   Wt 172 lb 6.4 oz (78.2 kg)   SpO2 96%   BMI 24.74 kg/m  BP Readings from Last 3 Encounters:  10/02/24 130/70  08/23/24 (!) 164/86  06/25/24 (!) 160/90  Wt Readings from Last 3 Encounters:  10/02/24 172 lb 6.4 oz (78.2 kg)  09/27/24 168 lb (76.2 kg)  08/23/24 173 lb (78.5 kg)      Physical Exam Vitals reviewed.  Constitutional:      General: He is not in acute distress.    Appearance: He is not ill-appearing.  Cardiovascular:     Rate and Rhythm: Normal rate and regular rhythm.     Heart sounds: Murmur heard.     Comments: 3/6 systolic ejection murmur left axillary region over mitral valve Pulmonary:     Effort: Pulmonary effort is normal.     Breath sounds: Normal breath sounds. No wheezing or rales.  Musculoskeletal:     Right lower  leg: No edema.     Left lower leg: No edema.  Neurological:     Mental Status: He is alert.      No results found for any visits on 10/02/24.  Last CBC Lab Results  Component Value Date   WBC 5.5 02/21/2024   HGB 13.7 02/21/2024   HCT 41.5 02/21/2024   MCV 93.9 02/21/2024   MCH 30.4 03/29/2022   RDW 14.4 02/21/2024   PLT 177.0 02/21/2024   Last metabolic panel Lab Results  Component Value Date   GLUCOSE 135 (H) 07/03/2024   NA 138 07/03/2024   K 4.9 07/03/2024   CL 96 07/03/2024   CO2 25 07/03/2024   BUN 14 07/03/2024   CREATININE 0.98 07/03/2024   EGFR 81 07/03/2024   CALCIUM  9.1 07/03/2024   PROT 7.1 08/21/2024   ALBUMIN 4.5 08/21/2024   BILITOT 0.3 08/21/2024   ALKPHOS 66 08/21/2024   AST 33 08/21/2024   ALT 21 08/21/2024   ANIONGAP 9 03/29/2022   Last lipids Lab Results  Component Value Date   CHOL 152 08/21/2024   HDL 61 08/21/2024   LDLCALC 64 08/21/2024   LDLDIRECT 117.9 03/15/2013   TRIG 162 (H) 08/21/2024   CHOLHDL 2.5 08/21/2024   Last hemoglobin A1c Lab Results  Component Value Date   HGBA1C 6.3 02/21/2024   Last thyroid  functions Lab Results  Component Value Date   TSH 3.59 02/21/2024   FREET4 0.65 07/29/2015      The 10-year ASCVD risk score (Arnett DK, et al., 2019) is: 23.5%    Assessment & Plan:   Problem List Items Addressed This Visit   None Visit Diagnoses       Exertional dyspnea    -  Primary   Relevant Orders   Ambulatory referral to Pulmonology     Ongoing issues with exertional dyspnea with occasional associated dizziness.  Has never noted any chest tightness or chest pain.  Etiology unclear.  Has had some recent occasional coughing and possible subjective wheezing with activities but not consistently.  He does have moderate mitral valve regurgitation.  Stress echo 2 years ago showed no ischemia.  He has pending follow-up with cardiology in December.  We did recommend consideration for referral to pulmonary for  further evaluation and possible PFTs.  He agrees with this plan.  No follow-ups on file.    Wolm Scarlet, MD

## 2024-10-04 ENCOUNTER — Ambulatory Visit: Admitting: Urology

## 2024-10-04 ENCOUNTER — Encounter: Payer: Self-pay | Admitting: Urology

## 2024-10-04 VITALS — BP 136/77 | HR 101 | Ht 69.0 in | Wt 169.0 lb

## 2024-10-04 DIAGNOSIS — N4231 Prostatic intraepithelial neoplasia: Secondary | ICD-10-CM | POA: Diagnosis not present

## 2024-10-04 DIAGNOSIS — C61 Malignant neoplasm of prostate: Secondary | ICD-10-CM | POA: Diagnosis not present

## 2024-10-04 DIAGNOSIS — Z2989 Encounter for other specified prophylactic measures: Secondary | ICD-10-CM | POA: Diagnosis not present

## 2024-10-04 MED ORDER — LIDOCAINE HCL URETHRAL/MUCOSAL 2 % EX GEL
1.0000 | Freq: Once | CUTANEOUS | Status: AC
  Administered 2024-10-04: 1 via URETHRAL

## 2024-10-04 MED ORDER — GENTAMICIN SULFATE 40 MG/ML IJ SOLN
80.0000 mg | Freq: Once | INTRAMUSCULAR | Status: AC
  Administered 2024-10-04: 80 mg via INTRAMUSCULAR

## 2024-10-04 NOTE — Patient Instructions (Signed)

## 2024-10-10 LAB — PROSTATE CORE NEEDLE BIOPSY

## 2024-10-14 ENCOUNTER — Encounter: Payer: Self-pay | Admitting: Internal Medicine

## 2024-10-17 NOTE — Assessment & Plan Note (Addendum)
 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 definitive therapy - PSA q6 mo - Prostate MRI q6-18 months (or interval biopsy as indicated)  - plan for prostate MRI in ~July/Aug 2027

## 2024-10-17 NOTE — Progress Notes (Unsigned)
   10/19/2024 9:35 AM   Daniel Sullivan 22-Sep-1951 [id number]  Reason for visit: Follow up prostate biopsy   HPI: 73 y.o. 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   Constitutional:  Alert and oriented, No acute distress.  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 definitive therapy - PSA q6 mo - Prostate MRI q6-18 months (or interval biopsy as indicated)  - plan for prostate MRI in ~July/Aug 2027        Penne JONELLE Skye, MD  [facility] [address], [address] [place], [place] [address] [phone]

## 2024-10-19 ENCOUNTER — Ambulatory Visit: Admitting: Urology

## 2024-10-19 VITALS — BP 171/80 | HR 93

## 2024-10-19 DIAGNOSIS — C61 Malignant neoplasm of prostate: Secondary | ICD-10-CM | POA: Diagnosis not present

## 2024-10-26 ENCOUNTER — Other Ambulatory Visit: Payer: Self-pay | Admitting: Family Medicine

## 2024-11-07 ENCOUNTER — Encounter: Payer: Self-pay | Admitting: Internal Medicine

## 2024-11-07 ENCOUNTER — Ambulatory Visit: Admitting: Internal Medicine

## 2024-11-07 VITALS — BP 150/92 | HR 90 | Temp 97.7°F | Ht 69.5 in | Wt 172.0 lb

## 2024-11-07 DIAGNOSIS — J4530 Mild persistent asthma, uncomplicated: Secondary | ICD-10-CM | POA: Diagnosis not present

## 2024-11-07 DIAGNOSIS — J302 Other seasonal allergic rhinitis: Secondary | ICD-10-CM | POA: Diagnosis not present

## 2024-11-07 LAB — POCT EXHALED NITRIC OXIDE: FeNO level (ppb): 17

## 2024-11-07 MED ORDER — FLUTICASONE-SALMETEROL 250-50 MCG/ACT IN AEPB: 1.0000 | INHALATION_SPRAY | Freq: Two times a day (BID) | RESPIRATORY_TRACT | 5 refills | Status: AC

## 2024-11-07 NOTE — Progress Notes (Signed)
 Daniel Sullivan    [id number]    11/30/1951  Primary Care Physician:Burchette, Wolm LELON, MD Date of Appointment: 11/07/2024 Established Patient Visit  Chief complaint:   Chief Complaint  Patient presents with   Shortness of Breath    Increase sob with exertion (uphill, going up stairs).  Worse during allergy season.     HPI: Daniel Sullivan is a 73 y.o. man with shortness of breath with seasonal variation. History of passive smoke exposure in childhood. Seasonal allergic rhinitis on flonase .    Interval Updates: Here for follow up after after about 1.5 years. At that visit had planned trial of albuterol  inhaler and that did seem to help. Has used it 5-7 times in the last month. Notes shortness of breath with exertion, seasonal variation.   No exacerbations, no bronchitis, no prednisone  use, no hospitalizations for breathing.   Did have some flare of his breathing during ragweed season and takes flonase  from August to first frost faithfully.   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   Diverticulosis    Elevated PSA    GERD (gastroesophageal reflux disease)    Hemorrhoids    History of blood clots    Hx of adenomatous colonic polyps    Hyperlipidemia    Hypertension    Hypogonadism in male     Past Surgical History:  Procedure Laterality Date   ANAL RECTAL MANOMETRY N/A 09/07/2017   Procedure: ANO RECTAL MANOMETRY;  Surgeon: Shila Gustav GAILS, MD;  Location: [facility];  Service: Endoscopy;  Laterality: N/A;   COLONOSCOPY W/ BIOPSIES     ESOPHAGOGASTRODUODENOSCOPY     Multiple   GIVENS CAPSULE STUDY  05/13/2021   HEMORRHOID BANDING      Family History  Problem Relation Age of Onset   Alcohol abuse Father    Suicidality Father    Emphysema Father    Melanoma Brother 31   Stomach cancer Neg Hx    Colon cancer Neg Hx    Rectal cancer Neg Hx     Esophageal cancer Neg Hx     Social History   Occupational History   Occupation: retired  Tobacco Use   Smoking status: Former    Current packs/day: 0.00    Average packs/day: 0.5 packs/day for 18.0 years (9.0 ttl pk-yrs)    Types: Cigarettes    Start date: 02/23/1970    Quit date: 02/24/1988    Years since quitting: 36.7   Smokeless tobacco: Never  Vaping Use   Vaping status: Never Used  Substance and Sexual Activity   Alcohol use: Yes    Comment: occasionally   Drug use: No   Sexual activity: Yes    Partners: Female     Physical Exam: Blood pressure (!) 150/92, pulse 90, temperature 97.7 F (36.5 C), temperature source Oral, height 5' 9.5 (1.765 m), weight 172 lb (78 kg), SpO2 94%.  Gen:      No acute distress ENT:  no nasal polyps, mucus membranes moist Lungs:    No increased respiratory effort, symmetric chest wall excursion, clear to auscultation bilaterally, no wheeze but there is a prolonged expiratory phase CV:         Regular rate and rhythm; no murmurs, rubs, or gallops.  No pedal edema   Data  Reviewed: Imaging: I have personally reviewed the CT Chest August 2024 mild left lower lobe chronic atelectatic changes, no nodules   PFTs:      No data to display         I have personally reviewed the patient's PFTs and   Labs: Lab Results  Component Value Date   NA 138 07/03/2024   K 4.9 07/03/2024   CO2 25 07/03/2024   GLUCOSE 135 (H) 07/03/2024   BUN 14 07/03/2024   CREATININE 0.98 07/03/2024   CALCIUM  9.1 07/03/2024   GFR 89.56 02/21/2024   EGFR 81 07/03/2024   GFRNONAA >60 03/29/2022   Lab Results  Component Value Date   WBC 5.5 02/21/2024   HGB 13.7 02/21/2024   HCT 41.5 02/21/2024   MCV 93.9 02/21/2024   PLT 177.0 02/21/2024    Immunization status: Immunization History  Administered Date(s) Administered   Fluad Quad(high Dose 65+) 08/06/2021, 08/24/2022   INFLUENZA, HIGH DOSE SEASONAL PF 09/06/2017, 07/26/2019, 09/27/2024    Influenza Whole 09/18/2010   Influenza,inj,Quad PF,6+ Mos 08/23/2013   Influenza-Unspecified 07/14/2018, 07/26/2019, 09/27/2024   Moderna Sars-Covid-2 Vaccination 01/08/2020, 02/07/2020, 10/14/2020   Pneumococcal Conjugate-13 04/01/2015   Pneumococcal Polysaccharide-23 03/09/2016   Td 12/06/2009   Tdap 02/01/2022   Unspecified SARS-COV-2 Vaccination 09/27/2024   Zoster Recombinant(Shingrix) 08/03/2018, 08/09/2018, 02/29/2020   Zoster, Live 04/08/2015     Assessment:  Mild persistent asthma, not well controlled Seasonal allergic rhinitis   Plan/Recommendations:  Start advair 1 puff in the morning, 1 puff at night, gargle after use.   Take the albuterol  rescue inhaler every 4 to 6 hours as needed for wheezing or shortness of breath. You can also take it 15 minutes before exercise or exertional activity. Side effects include heart racing or pounding, jitters or anxiety. If you have a history of an irregular heart rhythm, it can make this worse. Can also give some patients a hard time sleeping.  Continue treating your seasonal allergies with the flonase  nasal spray as needed. Sometimes allergies can worsen asthma control.   FeNO level (ppb)  Date/Time Value Ref Range Status  11/07/2024 10:14 AM 17  Final   This result suggests low (<25) Type 2 (T2) airway inflammation indicating a low likelihood of active T2-driven airway inflammation; reduced probability of response to inhaled corticosteroids.     Return to Care: Return in about 2 months (around 01/08/2025) for Dr. Zaida.   Daniel Gore, MD Pulmonary and Critical Care Medicine [facility] Office:[phone]

## 2024-11-07 NOTE — Patient Instructions (Addendum)
 It was a pleasure to see you today!  Please schedule follow up with Dr. Zaida in 2 months. Please call sooner [phone] if issues or concerns arise. You can also send us  a message through MyChart, but but aware that this is not to be used for urgent issues and it may take up to 5-7 days to receive a reply. Please be aware that you will likely be able to view your results before I have a chance to respond to them. Please give us  5 business days to respond to any non-urgent results.    Start advair 1 puff in the morning, 1 puff at night, gargle after use.   Take the albuterol  rescue inhaler every 4 to 6 hours as needed for wheezing or shortness of breath. You can also take it 15 minutes before exercise or exertional activity. Side effects include heart racing or pounding, jitters or anxiety. If you have a history of an irregular heart rhythm, it can make this worse. Can also give some patients a hard time sleeping.  Continue treating your seasonal allergies with the flonase  nasal spray as needed. Sometimes allergies can worsen asthma control.   Sometimes the inhalers we prescribe are either not covered or are too expensive.  In most cases your doctor can substitute an alternate inhaler which is covered under your insurance formulary.  Please call your insurance company to find out which inhaler is covered and let us  know, and we can prescribe an alternative.

## 2024-11-09 ENCOUNTER — Telehealth: Payer: Self-pay

## 2024-11-09 NOTE — Telephone Encounter (Signed)
 Copied from CRM #[id number]. Topic: General - Other >> Nov 09, 2024 10:51 AM Daniel Sullivan wrote: Reason for CRM: Patient called in wanting to know if office offers yellow fever vaccine, would like a callback to be scheduled if so

## 2024-11-09 NOTE — Telephone Encounter (Signed)
 Patient informed we do not offer yellow fever vaccine at our clinic

## 2024-11-19 ENCOUNTER — Encounter: Payer: Self-pay | Admitting: Internal Medicine

## 2024-11-19 ENCOUNTER — Encounter: Payer: Self-pay | Admitting: Cardiology

## 2024-11-22 ENCOUNTER — Encounter: Payer: Self-pay | Admitting: Family Medicine

## 2024-11-25 MED ORDER — ATOVAQUONE-PROGUANIL HCL 250-100 MG PO TABS: ORAL_TABLET | ORAL | 0 refills | Status: AC

## 2024-11-25 NOTE — Telephone Encounter (Signed)
 I sent in Malarone  to start 1-2 days prior to travel, during travel, and for one week after return.  Daniel LELON Scarlet MD [facility]

## 2024-12-02 ENCOUNTER — Other Ambulatory Visit: Payer: Self-pay | Admitting: Family Medicine

## 2024-12-04 ENCOUNTER — Other Ambulatory Visit: Payer: Self-pay | Admitting: Family Medicine

## 2024-12-05 ENCOUNTER — Ambulatory Visit: Admitting: Cardiology

## 2024-12-19 ENCOUNTER — Ambulatory Visit: Admitting: Cardiology

## 2024-12-20 ENCOUNTER — Other Ambulatory Visit: Payer: Self-pay | Admitting: Family Medicine

## 2025-01-08 ENCOUNTER — Encounter

## 2025-01-09 ENCOUNTER — Other Ambulatory Visit: Payer: Self-pay | Admitting: Family Medicine

## 2025-02-05 ENCOUNTER — Ambulatory Visit: Admitting: Cardiology

## 2025-03-19 ENCOUNTER — Encounter

## 2025-10-11 ENCOUNTER — Other Ambulatory Visit

## 2025-10-18 ENCOUNTER — Ambulatory Visit: Admitting: Urology
